# Patient Record
Sex: Male | Born: 1970
Health system: Southern US, Community
[De-identification: ages and names within clinical notes are randomized; demographics above are authoritative.]

## PROBLEM LIST (undated history)

## (undated) DIAGNOSIS — I10 Essential (primary) hypertension: Secondary | ICD-10-CM

## (undated) DIAGNOSIS — E119 Type 2 diabetes mellitus without complications: Secondary | ICD-10-CM

## (undated) DIAGNOSIS — I2510 Atherosclerotic heart disease of native coronary artery without angina pectoris: Secondary | ICD-10-CM

## (undated) HISTORY — DX: Atherosclerotic heart disease of native coronary artery without angina pectoris: I25.10

## (undated) HISTORY — PX: KNEE ARTHROSCOPY: SHX127

## (undated) HISTORY — PX: APPENDECTOMY: SHX54

---

## 2001-06-19 ENCOUNTER — Ambulatory Visit (HOSPITAL_COMMUNITY): Admission: RE | Admit: 2001-06-19 | Discharge: 2001-06-19 | Payer: Self-pay | Admitting: *Deleted

## 2001-06-19 ENCOUNTER — Encounter: Payer: Self-pay | Admitting: *Deleted

## 2015-08-03 ENCOUNTER — Encounter: Payer: Self-pay | Admitting: Family Medicine

## 2015-08-03 ENCOUNTER — Ambulatory Visit (INDEPENDENT_AMBULATORY_CARE_PROVIDER_SITE_OTHER): Payer: 59 | Admitting: Family Medicine

## 2015-08-03 VITALS — BP 148/92 | Temp 98.7°F | Ht 69.0 in | Wt 218.0 lb

## 2015-08-03 DIAGNOSIS — M67442 Ganglion, left hand: Secondary | ICD-10-CM

## 2015-08-03 NOTE — Progress Notes (Signed)
   Subjective:    Patient ID: [name], male    DOB: October 22, 1971, 44 y.o.   MRN: [id number]  HPIcyst on left middle finger. Came up 5 years ago. Saw Dr. Margo Aye in the past and had it removed but it came back.   Saw dr hall in past and cut it off, and then it coame back   Dr.  Margo Aye advised patient returns made to seek need to see a different type of specialist. Concerned it may be down towards the joint. Painful at times.   Review of Systems  no recent injury no headache no chest pain no back pain    Objective:   Physical Exam   alert vitals stable blood pressure good. Lungs clear heart rare rhythm. Nodular cyst potentially ganglion ounces time involved finger      Assessment & Plan:   impression cysts finger plan hand surgeon referral , pt encour to f u for chronic concerns and wellnessWSL

## 2015-08-17 ENCOUNTER — Telehealth: Payer: Self-pay | Admitting: Family Medicine

## 2015-08-17 NOTE — Telephone Encounter (Signed)
Sorry didn't do lets do

## 2015-08-17 NOTE — Telephone Encounter (Signed)
Pt is wanting to know if we were still going to do the referral to the hand surgeon for his cyst?  I do not see it in the system

## 2015-08-18 ENCOUNTER — Other Ambulatory Visit: Payer: Self-pay | Admitting: *Deleted

## 2015-08-18 DIAGNOSIS — M67449 Ganglion, unspecified hand: Secondary | ICD-10-CM

## 2015-08-18 NOTE — Telephone Encounter (Signed)
Order for referral put in.  

## 2015-08-18 NOTE — Telephone Encounter (Signed)
Pt notified on vm

## 2015-09-04 ENCOUNTER — Other Ambulatory Visit: Payer: Self-pay | Admitting: Orthopedic Surgery

## 2015-09-04 DIAGNOSIS — S60453A Superficial foreign body of left middle finger, initial encounter: Secondary | ICD-10-CM

## 2015-09-05 ENCOUNTER — Other Ambulatory Visit: Payer: Self-pay | Admitting: Orthopedic Surgery

## 2015-09-05 DIAGNOSIS — S60453A Superficial foreign body of left middle finger, initial encounter: Secondary | ICD-10-CM

## 2015-09-05 DIAGNOSIS — R52 Pain, unspecified: Secondary | ICD-10-CM

## 2015-09-05 DIAGNOSIS — R609 Edema, unspecified: Secondary | ICD-10-CM

## 2015-09-19 ENCOUNTER — Ambulatory Visit
Admission: RE | Admit: 2015-09-19 | Discharge: 2015-09-19 | Disposition: A | Discharge status: Home / Self Care | Payer: 59 | Source: Ambulatory Visit | Attending: Orthopedic Surgery | Admitting: Orthopedic Surgery

## 2015-09-19 DIAGNOSIS — R609 Edema, unspecified: Secondary | ICD-10-CM

## 2015-09-19 DIAGNOSIS — S60453A Superficial foreign body of left middle finger, initial encounter: Secondary | ICD-10-CM

## 2015-09-19 DIAGNOSIS — R52 Pain, unspecified: Secondary | ICD-10-CM

## 2015-09-27 ENCOUNTER — Other Ambulatory Visit: Payer: Self-pay | Admitting: Orthopedic Surgery

## 2015-10-05 ENCOUNTER — Encounter (HOSPITAL_BASED_OUTPATIENT_CLINIC_OR_DEPARTMENT_OTHER): Payer: Self-pay | Admitting: *Deleted

## 2015-10-12 ENCOUNTER — Ambulatory Visit (HOSPITAL_BASED_OUTPATIENT_CLINIC_OR_DEPARTMENT_OTHER)
Admission: RE | Admit: 2015-10-12 | Discharge: 2015-10-12 | Disposition: A | Discharge status: Home / Self Care | Payer: 59 | Source: Ambulatory Visit | Attending: Orthopedic Surgery | Admitting: Orthopedic Surgery

## 2015-10-12 ENCOUNTER — Encounter (HOSPITAL_BASED_OUTPATIENT_CLINIC_OR_DEPARTMENT_OTHER)
Admission: RE | Disposition: A | Discharge status: Home / Self Care | Payer: Self-pay | Source: Ambulatory Visit | Attending: Orthopedic Surgery

## 2015-10-12 ENCOUNTER — Encounter (HOSPITAL_BASED_OUTPATIENT_CLINIC_OR_DEPARTMENT_OTHER): Payer: Self-pay | Admitting: Certified Registered"

## 2015-10-12 ENCOUNTER — Ambulatory Visit (HOSPITAL_BASED_OUTPATIENT_CLINIC_OR_DEPARTMENT_OTHER): Payer: 59 | Admitting: Certified Registered"

## 2015-10-12 DIAGNOSIS — E119 Type 2 diabetes mellitus without complications: Secondary | ICD-10-CM | POA: Diagnosis not present

## 2015-10-12 DIAGNOSIS — R2232 Localized swelling, mass and lump, left upper limb: Secondary | ICD-10-CM | POA: Diagnosis present

## 2015-10-12 DIAGNOSIS — F17210 Nicotine dependence, cigarettes, uncomplicated: Secondary | ICD-10-CM | POA: Insufficient documentation

## 2015-10-12 DIAGNOSIS — M19042 Primary osteoarthritis, left hand: Secondary | ICD-10-CM | POA: Insufficient documentation

## 2015-10-12 DIAGNOSIS — I10 Essential (primary) hypertension: Secondary | ICD-10-CM | POA: Insufficient documentation

## 2015-10-12 HISTORY — PX: GANGLION CYST EXCISION: SHX1691

## 2015-10-12 HISTORY — DX: Type 2 diabetes mellitus without complications: E11.9

## 2015-10-12 HISTORY — DX: Essential (primary) hypertension: I10

## 2015-10-12 SURGERY — EXCISION, GANGLION CYST, WRIST
Anesthesia: Monitor Anesthesia Care | Site: Finger | Laterality: Left

## 2015-10-12 MED ORDER — ONDANSETRON HCL 4 MG/2ML IJ SOLN
INTRAMUSCULAR | Status: AC
  Filled 2015-10-12: qty 2

## 2015-10-12 MED ORDER — GLYCOPYRROLATE 0.2 MG/ML IJ SOLN: 0.2000 mg | Freq: Once | INTRAMUSCULAR | Status: DC | PRN

## 2015-10-12 MED ORDER — MIDAZOLAM HCL 2 MG/2ML IJ SOLN
INTRAMUSCULAR | Status: AC
  Filled 2015-10-12: qty 4

## 2015-10-12 MED ORDER — CHLORHEXIDINE GLUCONATE 4 % EX LIQD: 60.0000 mL | Freq: Once | CUTANEOUS | Status: DC

## 2015-10-12 MED ORDER — PROPOFOL 10 MG/ML IV BOLUS
INTRAVENOUS | Status: DC | PRN
  Administered 2015-10-12: 30 mg via INTRAVENOUS
  Administered 2015-10-12 (×2): 20 mg via INTRAVENOUS

## 2015-10-12 MED ORDER — LACTATED RINGERS IV SOLN
INTRAVENOUS | Status: DC
  Administered 2015-10-12: 12:00:00 via INTRAVENOUS

## 2015-10-12 MED ORDER — HYDROMORPHONE HCL 1 MG/ML IJ SOLN: 0.2500 mg | INTRAMUSCULAR | Status: DC | PRN

## 2015-10-12 MED ORDER — CEFAZOLIN SODIUM-DEXTROSE 2-3 GM-% IV SOLR
INTRAVENOUS | Status: AC
  Filled 2015-10-12: qty 50

## 2015-10-12 MED ORDER — MEPERIDINE HCL 25 MG/ML IJ SOLN: 6.2500 mg | INTRAMUSCULAR | Status: DC | PRN

## 2015-10-12 MED ORDER — HYDROCODONE-ACETAMINOPHEN 5-325 MG PO TABS: 1.0000 | ORAL_TABLET | Freq: Four times a day (QID) | ORAL | Status: DC | PRN

## 2015-10-12 MED ORDER — ONDANSETRON HCL 4 MG/2ML IJ SOLN
4.0000 mg | Freq: Once | INTRAMUSCULAR | Status: DC | PRN
Start: 2015-10-12 — End: 2015-10-12

## 2015-10-12 MED ORDER — FENTANYL CITRATE (PF) 100 MCG/2ML IJ SOLN
50.0000 ug | INTRAMUSCULAR | Status: DC | PRN
  Administered 2015-10-12 (×2): 50 ug via INTRAVENOUS

## 2015-10-12 MED ORDER — LIDOCAINE HCL (PF) 0.5 % IJ SOLN
INTRAMUSCULAR | Status: DC | PRN
  Administered 2015-10-12: 35 mL via INTRAVENOUS

## 2015-10-12 MED ORDER — CEFAZOLIN SODIUM-DEXTROSE 2-3 GM-% IV SOLR: 2.0000 g | INTRAVENOUS | Status: DC

## 2015-10-12 MED ORDER — SCOPOLAMINE 1 MG/3DAYS TD PT72: 1.0000 | MEDICATED_PATCH | Freq: Once | TRANSDERMAL | Status: DC | PRN

## 2015-10-12 MED ORDER — LIDOCAINE HCL (CARDIAC) 20 MG/ML IV SOLN
INTRAVENOUS | Status: AC
  Filled 2015-10-12: qty 5

## 2015-10-12 MED ORDER — BUPIVACAINE HCL (PF) 0.25 % IJ SOLN
INTRAMUSCULAR | Status: DC | PRN
  Administered 2015-10-12: 7 mL

## 2015-10-12 MED ORDER — FENTANYL CITRATE (PF) 100 MCG/2ML IJ SOLN
INTRAMUSCULAR | Status: AC
  Filled 2015-10-12: qty 4

## 2015-10-12 MED ORDER — CHLORHEXIDINE GLUCONATE 4 % EX LIQD
60.0000 mL | Freq: Once | CUTANEOUS | Status: DC
Start: 2015-10-12 — End: 2015-10-12

## 2015-10-12 MED ORDER — ONDANSETRON HCL 4 MG/2ML IJ SOLN
INTRAMUSCULAR | Status: DC | PRN
  Administered 2015-10-12: 4 mg via INTRAVENOUS

## 2015-10-12 MED ORDER — CEFAZOLIN SODIUM-DEXTROSE 2-3 GM-% IV SOLR
2.0000 g | INTRAVENOUS | Status: AC
  Administered 2015-10-12: 2 g via INTRAVENOUS

## 2015-10-12 MED ORDER — MIDAZOLAM HCL 2 MG/2ML IJ SOLN
1.0000 mg | INTRAMUSCULAR | Status: DC | PRN
  Administered 2015-10-12 (×2): 1 mg via INTRAVENOUS

## 2015-10-12 SURGICAL SUPPLY — 49 items
BAG DECANTER FOR FLEXI CONT (MISCELLANEOUS) IMPLANT
BLADE MINI RND TIP GREEN BEAV (BLADE) IMPLANT
BLADE SURG 15 STRL LF DISP TIS (BLADE) ×1 IMPLANT
BLADE SURG 15 STRL SS (BLADE) ×1
BNDG COHESIVE 1X5 TAN STRL LF (GAUZE/BANDAGES/DRESSINGS) ×2 IMPLANT
BNDG COHESIVE 3X5 TAN STRL LF (GAUZE/BANDAGES/DRESSINGS) ×2 IMPLANT
BNDG ESMARK 4X9 LF (GAUZE/BANDAGES/DRESSINGS) ×2 IMPLANT
BNDG GAUZE ELAST 4 BULKY (GAUZE/BANDAGES/DRESSINGS) IMPLANT
CHLORAPREP W/TINT 26ML (MISCELLANEOUS) IMPLANT
CORDS BIPOLAR (ELECTRODE) ×2 IMPLANT
COVER BACK TABLE 60X90IN (DRAPES) ×2 IMPLANT
COVER MAYO STAND STRL (DRAPES) ×2 IMPLANT
CUFF TOURNIQUET SINGLE 18IN (TOURNIQUET CUFF) ×2 IMPLANT
DECANTER SPIKE VIAL GLASS SM (MISCELLANEOUS) ×2 IMPLANT
DRAPE EXTREMITY T 121X128X90 (DRAPE) ×2 IMPLANT
DRAPE SURG 17X23 STRL (DRAPES) ×2 IMPLANT
GAUZE PACKING IODOFORM 1/4X15 (GAUZE/BANDAGES/DRESSINGS) IMPLANT
GAUZE SPONGE 4X4 12PLY STRL (GAUZE/BANDAGES/DRESSINGS) IMPLANT
GAUZE XEROFORM 1X8 LF (GAUZE/BANDAGES/DRESSINGS) ×2 IMPLANT
GLOVE BIOGEL PI IND STRL 8 (GLOVE) ×1 IMPLANT
GLOVE BIOGEL PI IND STRL 8.5 (GLOVE) ×1 IMPLANT
GLOVE BIOGEL PI INDICATOR 8 (GLOVE) ×1
GLOVE BIOGEL PI INDICATOR 8.5 (GLOVE) ×1
GLOVE SURG ORTHO 8.0 STRL STRW (GLOVE) IMPLANT
GOWN STRL REUS W/ TWL LRG LVL3 (GOWN DISPOSABLE) ×1 IMPLANT
GOWN STRL REUS W/TWL LRG LVL3 (GOWN DISPOSABLE) ×1
GOWN STRL REUS W/TWL XL LVL3 (GOWN DISPOSABLE) ×4 IMPLANT
LOOP VESSEL MAXI BLUE (MISCELLANEOUS) IMPLANT
NEEDLE PRECISIONGLIDE 27X1.5 (NEEDLE) ×2 IMPLANT
NS IRRIG 1000ML POUR BTL (IV SOLUTION) ×2 IMPLANT
PACK BASIN DAY SURGERY FS (CUSTOM PROCEDURE TRAY) ×2 IMPLANT
PAD CAST 3X4 CTTN HI CHSV (CAST SUPPLIES) IMPLANT
PADDING CAST ABS 4INX4YD NS (CAST SUPPLIES)
PADDING CAST ABS COTTON 4X4 ST (CAST SUPPLIES) IMPLANT
PADDING CAST COTTON 3X4 STRL (CAST SUPPLIES)
SPLINT FINGER 3.25 BULB 911905 (SOFTGOODS) ×2 IMPLANT
SPLINT PLASTER CAST XFAST 3X15 (CAST SUPPLIES) IMPLANT
SPLINT PLASTER XTRA FASTSET 3X (CAST SUPPLIES)
STOCKINETTE 4X48 STRL (DRAPES) ×2 IMPLANT
SUT ETHILON 4 0 PS 2 18 (SUTURE) ×2 IMPLANT
SUT VIC AB 4-0 P2 18 (SUTURE) IMPLANT
SUT VICRYL 4-0 PS2 18IN ABS (SUTURE) IMPLANT
SWAB COLLECTION DEVICE MRSA (MISCELLANEOUS) IMPLANT
SWAB CULTURE ESWAB REG 1ML (MISCELLANEOUS) IMPLANT
SYR BULB 3OZ (MISCELLANEOUS) ×2 IMPLANT
SYR CONTROL 10ML LL (SYRINGE) ×2 IMPLANT
TOWEL OR 17X24 6PK STRL BLUE (TOWEL DISPOSABLE) ×2 IMPLANT
TUBE FEEDING 5FR 15 INCH (TUBING) IMPLANT
UNDERPAD 30X30 (UNDERPADS AND DIAPERS) ×2 IMPLANT

## 2015-10-12 NOTE — Anesthesia Preprocedure Evaluation (Signed)
Anesthesia Evaluation  Patient identified by MRN, date of birth, ID band Patient awake    Reviewed: Allergy & Precautions, NPO status , Patient's Chart, lab work & pertinent test results  Airway Mallampati: I  TM Distance: >3 FB Neck ROM: Full    Dental   Pulmonary Current Smoker,    Pulmonary exam normal        Cardiovascular hypertension, Pt. on medications Normal cardiovascular exam     Neuro/Psych    GI/Hepatic   Endo/Other  diabetes, Type 2, Oral Hypoglycemic Agents  Renal/GU      Musculoskeletal   Abdominal   Peds  Hematology   Anesthesia Other Findings   Reproductive/Obstetrics                             Anesthesia Physical Anesthesia Plan  ASA: II  Anesthesia Plan: Bier Block   Post-op Pain Management:    Induction: Intravenous  Airway Management Planned: Simple Face Mask  Additional Equipment:   Intra-op Plan:   Post-operative Plan:   Informed Consent: I have reviewed the patients History and Physical, chart, labs and discussed the procedure including the risks, benefits and alternatives for the proposed anesthesia with the patient or authorized representative who has indicated his/her understanding and acceptance.     Plan Discussed with: CRNA and Surgeon  Anesthesia Plan Comments:         Anesthesia Quick Evaluation

## 2015-10-12 NOTE — Discharge Instructions (Addendum)

## 2015-10-12 NOTE — Anesthesia Postprocedure Evaluation (Signed)
Anesthesia Post Note  Patient: [name]  Procedure(s) Performed: Procedure(s) (LRB): EXCISION MUCOID TUMOR LEFT MIDDLE FINGER  (Left)  Anesthesia type: general  Patient location: PACU  Post pain: Pain level controlled  Post assessment: Patient's Cardiovascular Status Stable  Last Vitals:  Filed Vitals:   10/12/15 1415  BP: 134/90  Pulse: 66  Temp: 36.4 C  Resp: 20    Post vital signs: Reviewed and stable  Level of consciousness: sedated  Complications: No apparent anesthesia complications

## 2015-10-12 NOTE — Transfer of Care (Signed)
Immediate Anesthesia Transfer of Care Note  Patient: [name]  Procedure(s) Performed: Procedure(s) with comments: EXCISION MUCOID TUMOR LEFT MIDDLE FINGER  (Left) - Left middle finger.  Patient Location: PACU  Anesthesia Type:MAC and Bier block  Level of Consciousness: awake, alert , oriented and patient cooperative  Airway & Oxygen Therapy: Patient Spontanous Breathing and Patient connected to face mask oxygen  Post-op Assessment: Report given to RN, Post -op Vital signs reviewed and stable and Patient moving all extremities  Post vital signs: Reviewed and stable  Last Vitals:  Filed Vitals:   10/12/15 1121  BP: 156/93  Pulse: 71  Temp: 36.8 C  Resp: 20    Complications: No apparent anesthesia complications

## 2015-10-12 NOTE — Op Note (Signed)
Dictation Number [id number]

## 2015-10-12 NOTE — Brief Op Note (Signed)
10/12/2015  12:54 PM  PATIENT:  Octaviano GlowPhilip W Gauthier  44 y.o. 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  SURGEON:  Surgeon(s) and Role:    * Cindee SaltGary Dacoda Spallone, MD - Primary  PHYSICIAN ASSISTANT:   ASSISTANTS: none   ANESTHESIA:   local and regional  EBL:  Total I/O In: 600 [I.V.:600] Out: -   BLOOD ADMINISTERED:none  DRAINS: none   LOCAL MEDICATIONS USED:  BUPIVICAINE   SPECIMEN:  Excision  DISPOSITION OF SPECIMEN:  PATHOLOGY  COUNTS:  YES  TOURNIQUET:   Total Tourniquet Time Documented: Forearm (Left) - 31 minutes Total: Forearm (Left) - 31 minutes   DICTATION: .Other Dictation: Dictation Number [id number]  PLAN OF CARE: Discharge to home after PACU  PATIENT DISPOSITION:  PACU - hemodynamically stable.

## 2015-10-12 NOTE — H&P (Signed)
[name] is a 44 year-old right-hand dominant male who is referred by Dr. Gerda Diss for consultation with respect to a swelling area over the distal interphalangeal joint of his left middle finger. This has been present since 2010. He feels he stuck a bamboo briar in it at that time.  It has intermittently opened up, draining a clear, thick sticky fluid.  The last time being a couple of weeks ago.  He states that it is more of an annoyance causing mild discomfort for him, it is a stabbing type pain.  He feels it is gradually getting worse.  He has history of borderline diabetes, no history of thyroid problems, arthritis or gout.  He has had his MRI done. This reveals that he indeed has a cyst present. No foreign material was seen.   ALLERGIES:   None.  MEDICATIONS:      None.  SURGICAL HISTORY:     Appendectomy and knee surgery.  FAMILY MEDICAL HISTORY:    Negative.  SOCIAL HISTORY:      He smokes two packs a day and is advised to quit with the reasons behind this.  He does not drink.  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    REVIEW OF SYSTEMS:    Positive for glasses, ringing in his ears, otherwise negative 14 points.  [name] is an 44 y.o. male.   Chief Complaint: Mucoid tumor left middle finger HPI: see above  Past Medical History  Diagnosis Date  . Diabetes mellitus without complication (HCC)     diet controlled  . Hypertension     h/o of HTN, on no meds now.    Past Surgical History  Procedure Laterality Date  . Appendectomy    . Knee arthroscopy Left     History reviewed. No pertinent family history. Social History:  reports that he has been smoking.  He does not have any smokeless tobacco history on file. His alcohol and drug histories are not on file.  Allergies: No Known Allergies  Medications Prior to Admission  Medication Sig Dispense Refill  . glucosamine-chondroitin 500-400 MG tablet Take 1 tablet by mouth 3 (three) times daily.      No results  found for this or any previous visit (from the past 48 hour(s)).  No results found.   Pertinent items are noted in HPI.  Height  (1.753 m), weight 98.884 kg (218 lb).  General appearance: alert, cooperative and appears stated age Head: Normocephalic, without obvious abnormality Neck: no JVD Resp: clear to auscultation bilaterally Cardio: regular rate and rhythm, S1, S2 normal, no murmur, click, rub or gallop GI: soft, non-tender; bowel sounds normal; no masses,  no organomegaly Extremities: mass lrft middle finger dip joint Pulses: 2+ and symmetric Skin: Skin color, texture, turgor normal. No rashes or lesions Neurologic: Grossly normal Incision/Wound: na  Assessment/Plan RADIOGRAPHS:       X-rays reveal minimal changes to the distal interphalangeal joint, circulation is intact.  there is no evidence of infection.   x-rays reveal slight whitening of the sclerosis of the phalanx articular surface, otherwise negative.    DIAGNOSIS:      mucoid tumor with minimal degenerative changes.  The only question is whether indeed a briar was stuck in this and a portion was retained.     He would like to have this excised. Pre, peri and post op care are discussed along with risks and complications. Patient is aware there is no guarantee with  surgery, possibility of infection, injury to arteries, nerves, and tendons, incomplete relief and dystrophy. He is scheduled for excision of mucoid tumor, debridement DIP joint left middle finger as an outpatient under regional anesthesia.    [name] 10/12/2015, 11:10 AM

## 2015-10-13 ENCOUNTER — Encounter (HOSPITAL_BASED_OUTPATIENT_CLINIC_OR_DEPARTMENT_OTHER): Payer: Self-pay | Admitting: Orthopedic Surgery

## 2015-10-13 NOTE — Op Note (Signed)
[name], [name]               ACCOUNT NO.:  [id number]  MEDICAL RECORD NO.:  [id number]  LOCATION:                                 FACILITY:  PHYSICIAN:  Cindee SaltGary Demondre Aguas, M.D.            DATE OF BIRTH:  DATE OF PROCEDURE:  10/12/2015 DATE OF DISCHARGE:                              OPERATIVE REPORT   PREOPERATIVE DIAGNOSIS:  Mass, left middle finger with degenerative arthritis, distal interphalangeal joint.  POSTOPERATIVE DIAGNOSIS:  Mass, left middle finger with degenerative arthritis, distal interphalangeal joint.  OPERATION:  Excision of mass, debridement of distal interphalangeal joint, left middle finger.  SURGEON:  [name], M.D.  ANESTHESIA:  Forearm-based IV regional with metacarpal block.  ANESTHESIOLOGIST:  [name], M.D.  HISTORY:  The patient is a 44 year old male with a history of a mass over the DIP joint of his left middle finger.  Feels though he has gotten a foreign body into this.  MRI does not reveal a foreign body, but this is present indicative of a mucoid tumor.  He is desirous of having this excised.  Pre, peri, and postoperative course have been discussed along with risks and complications.  He is aware that there is no guarantee with the surgery, possibility of infection; recurrence of injury to arteries, nerves, tendons; incomplete relief of symptoms; and dystrophy.  In the preoperative area, the patient is seen, the extremity marked by both the patient and surgeon.  Antibiotic given.  PROCEDURE IN DETAIL:  The patient was brought to the operating room, where a forearm-based IV regional anesthetic was carried out without difficulty.  He was prepped using Betadine scrub and solution with the left arm free.  A 3-minute dry time was allowed.  Time-out taken, confirming the patient and procedure.  A metacarpal block was given with 0.25% bupivacaine without epinephrine.  A curvilinear incision was made over the mass, carried down through the  subcutaneous tissue.  Solid area was found with an area of granulation tissue in the center, this was excised in total and sent to Pathology.  The joint was opened, debrided with a small hemostatic rongeur, performing a synovectomy dorsally.  The wound was copiously irrigated with saline.  The skin then closed with interrupted 5-0 nylon sutures.  A metacarpal block given including bupivacaine with a total of 8 mL.  The patient tolerated the procedure well.  A sterile compressive dressing splint to the distal interphalangeal joint was applied prior to be discharged.  He was taken to the recovery room for observation in a satisfactory condition.  [facility] be [facility] home [facility] [place].          ______________________________ [name], M.D.     GK/MEDQ  D:  10/12/2015  T:  10/13/2015  Job:  [id number]

## 2015-10-13 NOTE — Addendum Note (Signed)
Addendum  created 10/13/15 [id number] by Barbarann Kelly W Sheniqua Carolan, CRNA   Modules edited: Charges VN

## 2015-10-18 ENCOUNTER — Encounter (HOSPITAL_COMMUNITY): Payer: Self-pay | Admitting: Emergency Medicine

## 2015-10-18 ENCOUNTER — Emergency Department (HOSPITAL_COMMUNITY)
Admission: EM | Admit: 2015-10-18 | Discharge: 2015-10-18 | Disposition: A | Discharge status: Home / Self Care | Payer: 59 | Attending: Emergency Medicine | Admitting: Emergency Medicine

## 2015-10-18 DIAGNOSIS — T1502XA Foreign body in cornea, left eye, initial encounter: Secondary | ICD-10-CM

## 2015-10-18 DIAGNOSIS — Z23 Encounter for immunization: Secondary | ICD-10-CM | POA: Insufficient documentation

## 2015-10-18 DIAGNOSIS — H18892 Other specified disorders of cornea, left eye: Secondary | ICD-10-CM

## 2015-10-18 DIAGNOSIS — F17200 Nicotine dependence, unspecified, uncomplicated: Secondary | ICD-10-CM | POA: Diagnosis not present

## 2015-10-18 DIAGNOSIS — Y9289 Other specified places as the place of occurrence of the external cause: Secondary | ICD-10-CM | POA: Diagnosis not present

## 2015-10-18 DIAGNOSIS — Y998 Other external cause status: Secondary | ICD-10-CM | POA: Insufficient documentation

## 2015-10-18 DIAGNOSIS — Z79899 Other long term (current) drug therapy: Secondary | ICD-10-CM | POA: Insufficient documentation

## 2015-10-18 DIAGNOSIS — E119 Type 2 diabetes mellitus without complications: Secondary | ICD-10-CM | POA: Diagnosis not present

## 2015-10-18 DIAGNOSIS — M151 Heberden's nodes (with arthropathy): Secondary | ICD-10-CM | POA: Insufficient documentation

## 2015-10-18 DIAGNOSIS — I10 Essential (primary) hypertension: Secondary | ICD-10-CM | POA: Insufficient documentation

## 2015-10-18 DIAGNOSIS — Y9389 Activity, other specified: Secondary | ICD-10-CM | POA: Insufficient documentation

## 2015-10-18 DIAGNOSIS — D2112 Benign neoplasm of connective and other soft tissue of left upper limb, including shoulder: Secondary | ICD-10-CM | POA: Insufficient documentation

## 2015-10-18 DIAGNOSIS — X58XXXA Exposure to other specified factors, initial encounter: Secondary | ICD-10-CM | POA: Diagnosis not present

## 2015-10-18 DIAGNOSIS — S0592XA Unspecified injury of left eye and orbit, initial encounter: Secondary | ICD-10-CM | POA: Diagnosis present

## 2015-10-18 MED ORDER — TETANUS-DIPHTH-ACELL PERTUSSIS 5-2.5-18.5 LF-MCG/0.5 IM SUSP
0.5000 mL | Freq: Once | INTRAMUSCULAR | Status: AC
  Administered 2015-10-18: 0.5 mL via INTRAMUSCULAR
  Filled 2015-10-18: qty 0.5

## 2015-10-18 MED ORDER — FLUORESCEIN SODIUM 1 MG OP STRP
ORAL_STRIP | OPHTHALMIC | Status: AC
  Filled 2015-10-18: qty 1

## 2015-10-18 MED ORDER — ERYTHROMYCIN 5 MG/GM OP OINT
TOPICAL_OINTMENT | Freq: Once | OPHTHALMIC | Status: AC
  Administered 2015-10-18: 13:00:00 via OPHTHALMIC
  Filled 2015-10-18: qty 3.5

## 2015-10-18 MED ORDER — TETRACAINE HCL 0.5 % OP SOLN
OPHTHALMIC | Status: AC
  Filled 2015-10-18: qty 2

## 2015-10-18 NOTE — ED Notes (Signed)
Injury to left eye on Monday working on steel.  Redness noted, rates pain 9/10.

## 2015-10-18 NOTE — ED Notes (Signed)
PA Julie Idol at bedside. 

## 2015-10-18 NOTE — Discharge Instructions (Signed)
Eye Foreign Body A foreign body refers to any object on the surface of the eye or in the eyeball that should not be there. A foreign body may be a small speck of dirt or dust, a hair or eyelash, a splinter, or any other object.  SIGNS AND SYMPTOMS Symptoms depend on what the foreign body is and where it is in the eye. The most common locations are:   On the inner surface of the upper or lower eyelids or on the covering of the white part of the eye (conjunctiva). Symptoms in this location are:  Pain and irritation, especially when blinking.  The feeling that something is in the eye.  On the surface of the clear covering on the front of the eye (cornea). Symptoms in this location include:  Pain and irritation.   Small "rust rings" around a metallic foreign body.  The feeling that something is in the eye.   Inside the eyeball. Foreign bodies inside the eye may cause:   Great pain.   Immediate loss of vision.   Distortion of the pupil. DIAGNOSIS  Foreign bodies are found during an exam by an eye specialist. Those on the eyelids, conjunctiva, or cornea are usually (but not always) easily found. When a foreign body is inside the eyeball, a cloudiness of the lens (cataract) may form almost right away. This makes it hard for an eye specialist to find the foreign body. Tests may be needed, including ultrasound testing, X-rays, and CT scans. TREATMENT   Foreign bodies on the eyelids, conjunctiva, or cornea are often removed easily and painlessly.  Rust in the cornea may require the use of a drill-like instrument to remove the rust.  If the foreign body has caused a scratch or a rubbing or scraping (abrasion) of the cornea, this may be treated with antibiotic drops or ointment. A pressure patch may be put over your eye.  If the foreign body is inside your eyeball, surgery is needed right away. This is a medical emergency. Foreign bodies inside the eye threaten vision. A person may even  lose his or her eye. HOME CARE INSTRUCTIONS   Take medicines only as directed by your health care provider. Use eye drops or ointment as directed.  If no eye patch was applied:  Keep your eye closed as much as possible.  Do not rub your eye.  Wear dark glasses as needed to protect your eyes from bright light.  Do not wear contact lenses until your eye feels normal again, or as instructed by your health care provider.  Wear a protective eye covering if there is a risk of eye injury. This is important when working with high-speed tools.  If your eye is patched:  Follow your health care provider's instructions for when to remove the patch.  Do notdrive or operate machinery if your eye is patched. Your ability to judge distances is impaired.  Keep all follow-up visits as directed by your health care provider. This is important. SEEK MEDICAL CARE IF:   You have increased pain in your eye.  Your vision gets worse.   You have problems with your eye patch.   You have fluid (discharge) coming from your injured eye.   You have redness and swelling around your affected eye.  MAKE SURE YOU:   Understand these instructions.  Will watch your condition.  Will get help right away if you are not doing well or get worse.   This information is not intended to  replace advice given to you by your health care provider. Make sure you discuss any questions you have with your health care provider.   Document Released: 11/18/2005 Document Revised: 12/09/2014 Document Reviewed: 04/15/2013 Elsevier Interactive Patient Education 2016 [employer].   Apply the antibiotic ointment to your left eye every 6 hours (small ribbon)

## 2015-10-20 NOTE — ED Provider Notes (Signed)
CSN: [id number]     Arrival date & time 10/18/15  1053 History   First MD Initiated Contact with Patient 10/18/15 1123     Chief Complaint  Patient presents with  . Eye Injury    left     (Consider location/radiation/quality/duration/timing/severity/associated sxs/prior Treatment) The history is provided by the patient.   [name] is a 44 y.o. male presenting with a 2 day history of left eye pain from foreign body.  He was grinding steel (wearing eyeglasses but not protective glasses) when a piece flew in the left eye.  He has had pain since the event along with photophobia.  He denies any reduction in visual acuity except as affected by copious clear tearing of the eye. He has been using otc saline eye drops without relief of symptoms. He is unsure of his tetanus status.    Past Medical History  Diagnosis Date  . Diabetes mellitus without complication (HCC)     diet controlled  . Hypertension     h/o of HTN, on no meds now.   Past Surgical History  Procedure Laterality Date  . Appendectomy    . Knee arthroscopy Left   . Ganglion cyst excision Left 10/12/2015    Procedure: EXCISION MUCOID TUMOR LEFT MIDDLE FINGER ;  Surgeon: [name], MD;  Location: [facility];  Service: Orthopedics;  Laterality: Left;  Left middle finger.   History reviewed. No pertinent family history. Social History  Substance Use Topics  . Smoking status: Current Every Day Smoker  . Smokeless tobacco: None  . Alcohol Use: None    Review of Systems  Constitutional: Negative.   HENT: Negative.   Eyes: Positive for photophobia and pain.  Respiratory: Negative.   Skin: Negative for wound.  Neurological: Negative for headaches.  Psychiatric/Behavioral: Negative.       Allergies  Review of patient's allergies indicates no known allergies.  Home Medications   Prior to Admission medications   Medication Sig Start Date End Date Taking? Authorizing Provider   glucosamine-chondroitin 500-400 MG tablet Take 1 tablet by mouth 3 (three) times daily.   Yes Historical Provider, MD  HYDROcodone-acetaminophen (NORCO) 5-325 MG tablet Take 1 tablet by mouth 3 (three) times daily as needed. 10/12/15  Yes Historical Provider, MD   BP 145/78 mmHg  Pulse 85  Temp(Src) 97.9 F (36.6 C) (Oral)  Resp 16  Ht 5\' 9"  (1.753 m)  Wt 215 lb (97.523 kg)  BMI 31.74 kg/m2  SpO2 100% Physical Exam  Constitutional: He appears well-developed and well-nourished.  HENT:  Head: Normocephalic and atraumatic.  Eyes: EOM are normal. Pupils are equal, round, and reactive to light. Lids are everted and swept, no foreign bodies found. Left conjunctiva is injected.  Slit lamp exam:      The left eye shows corneal ulcer, foreign body and fluorescein uptake.  Metal foreign body with surrounding rust ring left cornea 7 oclock position.  Neck: Normal range of motion.  Cardiovascular: Normal rate.   Pulmonary/Chest: Effort normal.  Abdominal: Bowel sounds are normal.  Neurological: He is alert.  Skin: Skin is warm and dry.  Psychiatric: He has a normal mood and affect.  Nursing note and vitals reviewed.   ED Course  .Foreign Body Removal Date/Time: 10/18/2015 12:30 PM Performed by: Burgess AmorIDOL, Whitni Pasquini Authorized by: Burgess AmorIDOL, Martine Bleecker Consent: Verbal consent obtained. Risks and benefits: risks, benefits and alternatives were discussed Consent given by: patient Patient identity confirmed: verbally with patient Body area: eye Location details:  left cornea Local anesthetic: tetracaine drops Anesthetic total: 2 drops Patient sedated: no Removal mechanism: moist cotton swab Eye examined with fluorescein. Fluorescein uptake. Corneal abrasion size: small Corneal abrasion location: medial and inferior Residual rust ring present. Depth: embedded Complexity: simple 1 objects recovered. Objects recovered: metal flake Post-procedure assessment: foreign body removed Comments: Metal  removed, residual rust ring within small ulceration site   (including critical care time) Labs Review Labs Reviewed - No data to display  Imaging Review No results found. I have personally reviewed and evaluated these images and lab results as part of my medical decision-making.   EKG Interpretation None      MDM   Final diagnoses:  Corneal rust ring of left eye  Corneal foreign body, left, initial encounter    Pt placed on erythromycin ointment to protect the eye and for comfort, tetanus updated.  Discussed with Dr. Lita Mains - will see pt in office f/u.  Advised pt to call for appt time for removal of rust ring this week.  Pt understands and agrees with plan.  The patient appears reasonably screened and/or stabilized for discharge and I doubt any other medical condition or other [facility] requiring further screening, evaluation, or treatment in the ED at this time prior to discharge.     Burgess Amor, PA-C 10/20/15 6213  Azalia Bilis, MD 10/20/15 [phone]

## 2016-05-08 ENCOUNTER — Emergency Department (HOSPITAL_COMMUNITY): Payer: BLUE CROSS/BLUE SHIELD

## 2016-05-08 ENCOUNTER — Encounter (HOSPITAL_COMMUNITY): Payer: Self-pay | Admitting: Emergency Medicine

## 2016-05-08 ENCOUNTER — Observation Stay (HOSPITAL_BASED_OUTPATIENT_CLINIC_OR_DEPARTMENT_OTHER)
Admission: EM | Admit: 2016-05-08 | Discharge: 2016-05-09 | Disposition: A | Discharge status: Home / Self Care | Payer: BLUE CROSS/BLUE SHIELD | Source: Home / Self Care | Attending: Emergency Medicine | Admitting: Emergency Medicine

## 2016-05-08 DIAGNOSIS — F17200 Nicotine dependence, unspecified, uncomplicated: Secondary | ICD-10-CM | POA: Insufficient documentation

## 2016-05-08 DIAGNOSIS — I10 Essential (primary) hypertension: Secondary | ICD-10-CM

## 2016-05-08 DIAGNOSIS — E119 Type 2 diabetes mellitus without complications: Secondary | ICD-10-CM

## 2016-05-08 DIAGNOSIS — I119 Hypertensive heart disease without heart failure: Secondary | ICD-10-CM | POA: Diagnosis not present

## 2016-05-08 DIAGNOSIS — E1165 Type 2 diabetes mellitus with hyperglycemia: Secondary | ICD-10-CM | POA: Diagnosis not present

## 2016-05-08 DIAGNOSIS — F419 Anxiety disorder, unspecified: Secondary | ICD-10-CM | POA: Diagnosis not present

## 2016-05-08 DIAGNOSIS — R0789 Other chest pain: Secondary | ICD-10-CM | POA: Diagnosis not present

## 2016-05-08 DIAGNOSIS — I214 Non-ST elevation (NSTEMI) myocardial infarction: Secondary | ICD-10-CM

## 2016-05-08 DIAGNOSIS — J9811 Atelectasis: Secondary | ICD-10-CM | POA: Diagnosis not present

## 2016-05-08 DIAGNOSIS — Z8249 Family history of ischemic heart disease and other diseases of the circulatory system: Secondary | ICD-10-CM | POA: Diagnosis not present

## 2016-05-08 DIAGNOSIS — R079 Chest pain, unspecified: Secondary | ICD-10-CM | POA: Diagnosis present

## 2016-05-08 DIAGNOSIS — E785 Hyperlipidemia, unspecified: Secondary | ICD-10-CM | POA: Diagnosis not present

## 2016-05-08 DIAGNOSIS — E118 Type 2 diabetes mellitus with unspecified complications: Secondary | ICD-10-CM | POA: Diagnosis not present

## 2016-05-08 DIAGNOSIS — I080 Rheumatic disorders of both mitral and aortic valves: Secondary | ICD-10-CM | POA: Diagnosis not present

## 2016-05-08 DIAGNOSIS — E1100 Type 2 diabetes mellitus with hyperosmolarity without nonketotic hyperglycemic-hyperosmolar coma (NKHHC): Secondary | ICD-10-CM | POA: Diagnosis not present

## 2016-05-08 DIAGNOSIS — E8881 Metabolic syndrome: Secondary | ICD-10-CM | POA: Diagnosis not present

## 2016-05-08 DIAGNOSIS — I6522 Occlusion and stenosis of left carotid artery: Secondary | ICD-10-CM | POA: Diagnosis not present

## 2016-05-08 DIAGNOSIS — Z79899 Other long term (current) drug therapy: Secondary | ICD-10-CM

## 2016-05-08 DIAGNOSIS — R739 Hyperglycemia, unspecified: Secondary | ICD-10-CM

## 2016-05-08 DIAGNOSIS — I209 Angina pectoris, unspecified: Secondary | ICD-10-CM | POA: Diagnosis not present

## 2016-05-08 DIAGNOSIS — I25110 Atherosclerotic heart disease of native coronary artery with unstable angina pectoris: Secondary | ICD-10-CM | POA: Diagnosis not present

## 2016-05-08 DIAGNOSIS — Z79891 Long term (current) use of opiate analgesic: Secondary | ICD-10-CM

## 2016-05-08 DIAGNOSIS — I252 Old myocardial infarction: Secondary | ICD-10-CM | POA: Diagnosis not present

## 2016-05-08 DIAGNOSIS — R918 Other nonspecific abnormal finding of lung field: Secondary | ICD-10-CM | POA: Diagnosis not present

## 2016-05-08 DIAGNOSIS — Z4682 Encounter for fitting and adjustment of non-vascular catheter: Secondary | ICD-10-CM | POA: Diagnosis not present

## 2016-05-08 DIAGNOSIS — Z951 Presence of aortocoronary bypass graft: Secondary | ICD-10-CM | POA: Diagnosis not present

## 2016-05-08 DIAGNOSIS — I4891 Unspecified atrial fibrillation: Secondary | ICD-10-CM | POA: Diagnosis not present

## 2016-05-08 DIAGNOSIS — F17210 Nicotine dependence, cigarettes, uncomplicated: Secondary | ICD-10-CM | POA: Diagnosis not present

## 2016-05-08 DIAGNOSIS — I2510 Atherosclerotic heart disease of native coronary artery without angina pectoris: Secondary | ICD-10-CM | POA: Diagnosis not present

## 2016-05-08 DIAGNOSIS — Z720 Tobacco use: Secondary | ICD-10-CM

## 2016-05-08 LAB — GLUCOSE, CAPILLARY
GLUCOSE-CAPILLARY: 222 mg/dL — AB (ref 65–99)
Glucose-Capillary: 305 mg/dL — ABNORMAL HIGH (ref 65–99)

## 2016-05-08 LAB — BASIC METABOLIC PANEL
ANION GAP: 7 (ref 5–15)
BUN: 14 mg/dL (ref 6–20)
CO2: 27 mmol/L (ref 22–32)
Calcium: 9.1 mg/dL (ref 8.9–10.3)
Chloride: 102 mmol/L (ref 101–111)
Creatinine, Ser: 0.87 mg/dL (ref 0.61–1.24)
GLUCOSE: 318 mg/dL — AB (ref 65–99)
POTASSIUM: 3.5 mmol/L (ref 3.5–5.1)
SODIUM: 136 mmol/L (ref 135–145)

## 2016-05-08 LAB — CBC
HEMATOCRIT: 45.5 % (ref 39.0–52.0)
HEMOGLOBIN: 16.7 g/dL (ref 13.0–17.0)
MCH: 32.2 pg (ref 26.0–34.0)
MCHC: 36.7 g/dL — ABNORMAL HIGH (ref 30.0–36.0)
MCV: 87.8 fL (ref 78.0–100.0)
Platelets: 154 10*3/uL (ref 150–400)
RBC: 5.18 MIL/uL (ref 4.22–5.81)
RDW: 11.9 % (ref 11.5–15.5)
WBC: 8.5 10*3/uL (ref 4.0–10.5)

## 2016-05-08 LAB — TROPONIN I
TROPONIN I: 0.18 ng/mL — AB (ref <0.031)
TROPONIN I: 0.16 ng/mL — AB (ref <0.031)
Troponin I: 0.04 ng/mL — ABNORMAL HIGH (ref <0.031)

## 2016-05-08 LAB — PROTIME-INR
INR: 0.97 (ref 0.00–1.49)
PROTHROMBIN TIME: 13.1 s (ref 11.6–15.2)

## 2016-05-08 LAB — MAGNESIUM: MAGNESIUM: 1.8 mg/dL (ref 1.7–2.4)

## 2016-05-08 MED ORDER — GI COCKTAIL ~~LOC~~: 30.0000 mL | Freq: Four times a day (QID) | ORAL | Status: DC | PRN

## 2016-05-08 MED ORDER — ASPIRIN EC 325 MG PO TBEC: 325.0000 mg | DELAYED_RELEASE_TABLET | Freq: Every day | ORAL | Status: DC

## 2016-05-08 MED ORDER — INSULIN ASPART 100 UNIT/ML ~~LOC~~ SOLN
0.0000 [IU] | Freq: Three times a day (TID) | SUBCUTANEOUS | Status: DC
  Administered 2016-05-08: 7 [IU] via SUBCUTANEOUS
  Administered 2016-05-09: 3 [IU] via SUBCUTANEOUS

## 2016-05-08 MED ORDER — ACETAMINOPHEN 325 MG PO TABS: 650.0000 mg | ORAL_TABLET | ORAL | Status: DC | PRN

## 2016-05-08 MED ORDER — ASPIRIN 81 MG PO CHEW
324.0000 mg | CHEWABLE_TABLET | Freq: Once | ORAL | Status: AC
  Administered 2016-05-08: 324 mg via ORAL
  Filled 2016-05-08: qty 4

## 2016-05-08 MED ORDER — INSULIN ASPART 100 UNIT/ML ~~LOC~~ SOLN
0.0000 [IU] | Freq: Every day | SUBCUTANEOUS | Status: DC
Start: 2016-05-08 — End: 2016-05-09
  Administered 2016-05-08: 2 [IU] via SUBCUTANEOUS

## 2016-05-08 MED ORDER — NITROGLYCERIN 2 % TD OINT
1.0000 [in_us] | TOPICAL_OINTMENT | Freq: Four times a day (QID) | TRANSDERMAL | Status: DC
  Administered 2016-05-08: 1 [in_us] via TOPICAL
  Filled 2016-05-08: qty 1

## 2016-05-08 MED ORDER — NITROGLYCERIN 0.4 MG SL SUBL
0.4000 mg | SUBLINGUAL_TABLET | SUBLINGUAL | Status: DC | PRN
  Administered 2016-05-08: 0.4 mg via SUBLINGUAL
  Filled 2016-05-08: qty 1

## 2016-05-08 MED ORDER — INSULIN ASPART 100 UNIT/ML ~~LOC~~ SOLN
8.0000 [IU] | Freq: Once | SUBCUTANEOUS | Status: AC
  Administered 2016-05-08: 8 [IU] via INTRAVENOUS
  Filled 2016-05-08: qty 1

## 2016-05-08 MED ORDER — ONDANSETRON HCL 4 MG/2ML IJ SOLN: 4.0000 mg | Freq: Four times a day (QID) | INTRAMUSCULAR | Status: DC | PRN

## 2016-05-08 NOTE — Progress Notes (Signed)
Patient troponin level 0.18. Denies chest pain or other discomfort. Text-paged Dr. Irene LimboGoodrich to notify. Earnstine RegalAshley Ashleigh Luckow, RN

## 2016-05-08 NOTE — ED Notes (Signed)
Pt reports cp in center of chest with no radiation since this morning while at work, pt reports sob and diaphoresis, no cardiac hx reported from pt.

## 2016-05-08 NOTE — ED Notes (Signed)
MD at bedside. 

## 2016-05-08 NOTE — H&P (Signed)
History and Physical  [name] ONG:[id number] DOB: 05/01/1971 DOA: 05/08/2016  Referring physician: Jacalyn LefevreJulie Haviland, MD PCP: Lubertha SouthSteve Luking, MD   Chief Complaint: Chest pain  HPI:  [age] yom with history of hypertension and diet-controlled diabetes mellitus but no cardiac history presented with complaints of chest pain which was resolved in the emergency department. Initial troponin was slightly elevated and he was admitted for observation and further evaluation of chest pain.  Woke up this morning and felt fine, was able to eat without difficulty. Pain onset at work and orginially thought to be indigestion. Reported taking antiacid with no relief. Pain progressively worsened and he noticed increased sweating. Pain in central chest and described as pressure. Severity noted to be 3-4/10. Denies nausea, vomiting, left arm pain and numbness. This is new pain. Denies any heavy lifting and or SOB with activities. Walks several miles per day without chest pain or shortness of breath normally. Pain lasted one hour but has resolved with NTG and aspirin given in the ED.   In the emergency department: treated with ASA Pertinent labs: BMP, CBC unremarkable. Troponin .04. EKG: Independently reviewed. SR, no acute changes Imaging: CXR independently reviewed. No acute disease  Review of Systems:  Positive for chest pain, diaphoresis   Negative for fever, visual changes, sore throat, rash, new muscle aches, SOB, dysuria, bleeding, n/v/abdominal pain.  Past Medical History  Diagnosis Date  . Diabetes mellitus without complication (HCC)     diet controlled  . Hypertension     h/o of HTN, on no meds now.    Past Surgical History  Procedure Laterality Date  . Appendectomy    . Knee arthroscopy Left   . Ganglion cyst excision Left 10/12/2015    Procedure: EXCISION MUCOID TUMOR LEFT MIDDLE FINGER ;  Surgeon: [name], MD;  Location: [facility];  Service: Orthopedics;  Laterality: Left;   Left middle finger.    Social History:  reports that he has been smoking.  He does not have any smokeless tobacco history on file. He reports that he uses illicit drugs (Marijuana). He reports that he does not drink alcohol.  No Known Allergies  Family History  Problem Relation Age of Onset  . Diabetes Brother      Prior to Admission medications   Medication Sig Start Date End Date Taking? Authorizing Provider  glucosamine-chondroitin 500-400 MG tablet Take 1 tablet by mouth daily.   Yes Historical Provider, MD  naproxen sodium (ANAPROX) 220 MG tablet Take 440 mg by mouth daily as needed (headache/pain).   Yes Historical Provider, MD   Physical Exam: Filed Vitals:   05/08/16 0930 05/08/16 1000 05/08/16 1030 05/08/16 1146  BP: 145/91 136/90 146/85 161/87  Pulse: 85 76 70 74  Temp:    97.6 F (36.4 C)  TempSrc:    Oral  Resp: 21 20 17 20   Weight:      SpO2: 93% 99% 96% 98%   Constitutional:  . Appears calm and comfortable Eyes:  . PERRL and irises appear normal . Normal conjunctivae and lids ENMT:  . external ears, nose appear normal . grossly normal hearing Neck:  . neck appears normal, no masses, normal ROM, supple . no thyromegaly Respiratory:  . CTA bilaterally, no w/r/r.  . Respiratory effort normal. No retractions or accessory muscle use Cardiovascular:  . RRR, no m/r/g . No LE extremity edema   Abdomen:  . Abdomen appears normal; no tenderness or masses Musculoskeletal:  . RUE, LUE, RLE, LLE  o strength and tone normal, no atrophy, no abnormal movements o No tenderness, masses Skin:  . No rashes, lesions, ulcers . palpation of skin: no induration or nodules Neurologic:  . Appears grossly normal Psychiatric:  . judgement and insight appear normal . Mental status o Mood and affect appear appropriate  Wt Readings from Last 3 Encounters:  05/08/16 97.523 kg (215 lb)  10/18/15 97.523 kg (215 lb)  10/12/15 98.431 kg (217 lb)    Labs on Admission:    Basic Metabolic Panel:  Recent Labs Lab 05/08/16 0931  NA 136  K 3.5  CL 102  CO2 27  GLUCOSE 318*  BUN 14  CREATININE 0.87  CALCIUM 9.1  MG 1.8   CBC:  Recent Labs Lab 05/08/16 0931  WBC 8.5  HGB 16.7  HCT 45.5  MCV 87.8  PLT 154    Cardiac Enzymes:  Recent Labs Lab 05/08/16 0931  TROPONINI 0.04*    Radiological Exams on Admission: Dg Chest 2 View  05/08/2016  CLINICAL DATA:  Central chest pain with mild shortness of breast since this morning, smoker, diabetes mellitus, hypertension EXAM: CHEST  2 VIEW COMPARISON:  Prior on the time line from predate PACs and is not available for comparison FINDINGS: Normal heart size, mediastinal contours, and pulmonary vascularity. Lungs clear. No pleural effusion or pneumothorax. Bones unremarkable. IMPRESSION: No acute abnormalities. Electronically Signed   By: [name] M.D.   On: 05/08/2016 09:47      Principal Problem:   Chest pain Active Problems:   DM type 2 (diabetes mellitus, type 2) (HCC)   Assessment/Plan 1. Chest pain With some typical features, with minimal troponin elevation. Given ASA in ED, pain free after NTG. EKG nonacute. 2. DM type 2 diet-controlled, appears stable. 3. HTN not on meds. 4. Tobacco dependence    Obs, trend troponin, monitor for recurrent pain.  Cardiology consultation 6/8 further recommendations. Nothing by mouth after midnight in case stress testing proceeded.  Code Status:   DVT prophylaxis:SCDs Family Communication: No family bedside.  Disposition Plan/Anticipated LOS: Observation, discharge home in 23 hours.   Time spent: 55 minutes  Brendia Sacks, MD  [facility] Direct contact: [phone] --Via amion app OR  --[email or web address]; password [email or web address] and click  7PM-7AM contact night coverage as above  05/08/2016, 2:43 PM    By signing my name below, I, Zadie Cleverly attest that this documentation has been prepared under the direction and in the presence of  Brendia Sacks, MD Electronically signed: Zadie Cleverly  05/08/2016 1:40pm   I personally performed the services described in this documentation. All medical record entries made by the scribe were at my direction. I have reviewed the chart and agree that the record reflects my personal performance and is accurate and complete. Brendia Sacks, MD

## 2016-05-08 NOTE — Progress Notes (Signed)
Inpatient Diabetes Program Recommendations  AACE/ADA: New Consensus Statement on Inpatient Glycemic Control (2015)  Target Ranges:  Prepandial:   less than 140 mg/dL      Peak postprandial:   less than 180 mg/dL (1-2 hours)      Critically ill patients:  140 - 180 mg/dL   Results for [name], [name] (MRN [id number]) as of 05/08/2016 15:35  Ref. Range 05/08/2016 09:31  Glucose Latest Ref Range: 65-99 mg/dL 725318 (H)   Review of Glycemic Control  Diabetes history: DM2 Outpatient Diabetes medications: None (diet controlled) Current orders for Inpatient glycemic control: None  Inpatient Diabetes Program Recommendations: Correction (SSI): Initial glucose on labs was 318 mg/ld at 9:31 am today. While inpatient, please order CBGs with Nvoolog correction scale ACHS. HgbA1C: Please order an A1C (add on to blood in lab if possible) to evaluate glycemic control over the past 2-3 months.  Thanks, Orlando PennerMarie Gerardo Territo, RN, MSN, CDE Diabetes Coordinator Inpatient Diabetes Program [phone] (Team Pager from 8am to 5pm) [phone] ([facility] office) [phone] [facility] office) [phone] [facility] office)

## 2016-05-08 NOTE — ED Provider Notes (Signed)
CSN: [id number]     Arrival date & time 05/08/16  [id number] History  By signing my name below, I, [name], attest that this documentation has been prepared under the direction and in the presence of [name], MD.   Electronically Signed: [name], ED Scribe. 05/08/2016. 10:31 AM   Chief Complaint  Patient presents with  . Chest Pain   The history is provided by the patient. No language interpreter was used.   HPI Comments: [name] is a 45 y.o. male with PMHx of HTN and DM who presents to the Emergency Department complaining of gradual onset, non-radiating chest pain, beginning this morning while at work. He states the pain began after eating but has not alleviated. Pt reports the pain feels like a pressure in his chest. He also complains of mild shortness of breath and diaphoresis. No other associated symptoms noted. Pt states he took antacids PTA with no relief to symptoms. No other worsening or alleviating factors noted. Pt denies hx of heart problems, or any other pertinent symptoms.  Past Medical History  Diagnosis Date  . Diabetes mellitus without complication (HCC)     diet controlled  . Hypertension     h/o of HTN, on no meds now.   Past Surgical History  Procedure Laterality Date  . Appendectomy    . Knee arthroscopy Left   . Ganglion cyst excision Left 10/12/2015    Procedure: EXCISION MUCOID TUMOR LEFT MIDDLE FINGER ;  Surgeon: [name], MD;  Location: [facility];  Service: Orthopedics;  Laterality: Left;  Left middle finger.   History reviewed. No pertinent family history. Social History  Substance Use Topics  . Smoking status: Current Every Day Smoker -- 2.00 packs/day  . Smokeless tobacco: None  . Alcohol Use: No    Review of Systems  Constitutional: Positive for diaphoresis.  Respiratory: Positive for shortness of breath.   Cardiovascular: Positive for chest pain.  All other systems reviewed and are  negative.    Allergies  Review of patient's allergies indicates no known allergies.  Home Medications   Prior to Admission medications   Medication Sig Start Date End Date Taking? Authorizing Provider  glucosamine-chondroitin 500-400 MG tablet Take 1 tablet by mouth daily.   Yes Historical Provider, MD  naproxen sodium (ANAPROX) 220 MG tablet Take 440 mg by mouth daily as needed (headache/pain).   Yes Historical Provider, MD   BP 199/101 mmHg  Pulse 78  Temp(Src) 97.8 F (36.6 C) (Oral)  Resp 20  Wt 215 lb (97.523 kg)  SpO2 99% Physical Exam  Constitutional: He is oriented to person, place, and time. He appears well-developed and well-nourished.  HENT:  Head: Normocephalic.  Right Ear: External ear normal.  Left Ear: External ear normal.  Eyes: EOM are normal.  Neck: Normal range of motion.  Cardiovascular: Normal rate, regular rhythm and normal heart sounds.  Exam reveals no gallop.   No murmur heard. Pulmonary/Chest: Effort normal. No respiratory distress. He has wheezes. He has no rales.  Minimal wheezing noted.  Abdominal: Soft. Bowel sounds are normal. He exhibits no distension. There is no tenderness.  Musculoskeletal: Normal range of motion.  Neurological: He is alert and oriented to person, place, and time.  Psychiatric: He has a normal mood and affect.  Nursing note and vitals reviewed.   ED Course  Procedures (including critical care time) DIAGNOSTIC STUDIES: Oxygen Saturation is 99% on RA, normal by my interpretation.    COORDINATION OF  CARE: 9:16 AM Discussed treatment plan with pt at bedside and pt agreed to plan.  Labs Review Labs Reviewed  BASIC METABOLIC PANEL - Abnormal; Notable for the following:    Glucose, Bld 318 (*)    All other components within normal limits  CBC - Abnormal; Notable for the following:    MCHC 36.7 (*)    All other components within normal limits  TROPONIN I - Abnormal; Notable for the following:    Troponin I 0.04 (*)     All other components within normal limits  MAGNESIUM  PROTIME-INR  URINALYSIS, ROUTINE W REFLEX MICROSCOPIC (NOT AT [facility])    Imaging Review Dg Chest 2 View  05/08/2016  CLINICAL DATA:  Central chest pain with mild shortness of breast since this morning, smoker, diabetes mellitus, hypertension EXAM: CHEST  2 VIEW COMPARISON:  Prior on the time line from predate PACs and is not available for comparison FINDINGS: Normal heart size, mediastinal contours, and pulmonary vascularity. Lungs clear. No pleural effusion or pneumothorax. Bones unremarkable. IMPRESSION: No acute abnormalities. Electronically Signed   By: [name] M.D.   On: 05/08/2016 09:47   I have personally reviewed and evaluated these images and lab results as part of my medical decision-making.   EKG Interpretation   Date/Time:  Wednesday May 08 2016 09:12:21 EDT Ventricular Rate:  80 PR Interval:  129 QRS Duration: 106 QT Interval:  393 QTC Calculation: 453 R Axis:   84 Text Interpretation:  Sinus rhythm Borderline repolarization abnormality  Confirmed by [name] MD, [name] ([id number]) on 05/08/2016 9:24:14 AM      MDM  PT D/W DR. Irene Limbo ([facility]) WHO WILL ADMIT PT FOR OBS. Final diagnoses:  NSTEMI (non-ST elevated myocardial infarction) (HCC)  Hyperglycemia  Essential hypertension  Tobacco abuse      Jacalyn Lefevre, MD 05/08/16 1031

## 2016-05-09 ENCOUNTER — Telehealth: Payer: Self-pay | Admitting: *Deleted

## 2016-05-09 ENCOUNTER — Other Ambulatory Visit: Payer: Self-pay

## 2016-05-09 ENCOUNTER — Inpatient Hospital Stay (HOSPITAL_COMMUNITY)
Admission: EM | Admit: 2016-05-09 | Discharge: 2016-05-17 | DRG: 234 | Disposition: A | Discharge status: Home / Self Care | Payer: BLUE CROSS/BLUE SHIELD | Attending: Cardiothoracic Surgery | Admitting: Cardiothoracic Surgery

## 2016-05-09 ENCOUNTER — Emergency Department (HOSPITAL_BASED_OUTPATIENT_CLINIC_OR_DEPARTMENT_OTHER): Payer: BLUE CROSS/BLUE SHIELD

## 2016-05-09 ENCOUNTER — Emergency Department (HOSPITAL_COMMUNITY): Payer: BLUE CROSS/BLUE SHIELD

## 2016-05-09 ENCOUNTER — Telehealth: Payer: Self-pay | Admitting: Family Medicine

## 2016-05-09 ENCOUNTER — Encounter (HOSPITAL_COMMUNITY): Payer: Self-pay | Admitting: Family Medicine

## 2016-05-09 DIAGNOSIS — E1165 Type 2 diabetes mellitus with hyperglycemia: Secondary | ICD-10-CM | POA: Diagnosis present

## 2016-05-09 DIAGNOSIS — R079 Chest pain, unspecified: Secondary | ICD-10-CM | POA: Diagnosis present

## 2016-05-09 DIAGNOSIS — Z720 Tobacco use: Secondary | ICD-10-CM | POA: Diagnosis not present

## 2016-05-09 DIAGNOSIS — E118 Type 2 diabetes mellitus with unspecified complications: Secondary | ICD-10-CM | POA: Diagnosis not present

## 2016-05-09 DIAGNOSIS — Z951 Presence of aortocoronary bypass graft: Secondary | ICD-10-CM

## 2016-05-09 DIAGNOSIS — I10 Essential (primary) hypertension: Secondary | ICD-10-CM | POA: Diagnosis present

## 2016-05-09 DIAGNOSIS — I214 Non-ST elevation (NSTEMI) myocardial infarction: Principal | ICD-10-CM | POA: Insufficient documentation

## 2016-05-09 DIAGNOSIS — I6522 Occlusion and stenosis of left carotid artery: Secondary | ICD-10-CM | POA: Diagnosis present

## 2016-05-09 DIAGNOSIS — I4891 Unspecified atrial fibrillation: Secondary | ICD-10-CM | POA: Diagnosis present

## 2016-05-09 DIAGNOSIS — I252 Old myocardial infarction: Secondary | ICD-10-CM

## 2016-05-09 DIAGNOSIS — Z8249 Family history of ischemic heart disease and other diseases of the circulatory system: Secondary | ICD-10-CM

## 2016-05-09 DIAGNOSIS — I25110 Atherosclerotic heart disease of native coronary artery with unstable angina pectoris: Secondary | ICD-10-CM | POA: Diagnosis present

## 2016-05-09 DIAGNOSIS — I119 Hypertensive heart disease without heart failure: Secondary | ICD-10-CM | POA: Diagnosis not present

## 2016-05-09 DIAGNOSIS — E1100 Type 2 diabetes mellitus with hyperosmolarity without nonketotic hyperglycemic-hyperosmolar coma (NKHHC): Secondary | ICD-10-CM

## 2016-05-09 DIAGNOSIS — R072 Precordial pain: Secondary | ICD-10-CM

## 2016-05-09 DIAGNOSIS — F17210 Nicotine dependence, cigarettes, uncomplicated: Secondary | ICD-10-CM | POA: Diagnosis present

## 2016-05-09 DIAGNOSIS — R0789 Other chest pain: Secondary | ICD-10-CM | POA: Diagnosis not present

## 2016-05-09 DIAGNOSIS — I2510 Atherosclerotic heart disease of native coronary artery without angina pectoris: Secondary | ICD-10-CM | POA: Insufficient documentation

## 2016-05-09 DIAGNOSIS — E8881 Metabolic syndrome: Secondary | ICD-10-CM | POA: Diagnosis present

## 2016-05-09 DIAGNOSIS — F419 Anxiety disorder, unspecified: Secondary | ICD-10-CM | POA: Diagnosis present

## 2016-05-09 DIAGNOSIS — Z79899 Other long term (current) drug therapy: Secondary | ICD-10-CM

## 2016-05-09 DIAGNOSIS — E785 Hyperlipidemia, unspecified: Secondary | ICD-10-CM | POA: Diagnosis present

## 2016-05-09 DIAGNOSIS — F17200 Nicotine dependence, unspecified, uncomplicated: Secondary | ICD-10-CM | POA: Diagnosis not present

## 2016-05-09 LAB — BASIC METABOLIC PANEL
Anion gap: 9 (ref 5–15)
BUN: 11 mg/dL (ref 6–20)
CHLORIDE: 101 mmol/L (ref 101–111)
CO2: 25 mmol/L (ref 22–32)
CREATININE: 1 mg/dL (ref 0.61–1.24)
Calcium: 9.2 mg/dL (ref 8.9–10.3)
GFR calc non Af Amer: >60 mL/min (ref >60)
Glucose, Bld: 295 mg/dL — ABNORMAL HIGH (ref 65–99)
POTASSIUM: 4.2 mmol/L (ref 3.5–5.1)
Sodium: 135 mmol/L (ref 135–145)

## 2016-05-09 LAB — TROPONIN I
TROPONIN I: 0.17 ng/mL — AB (ref <0.031)
TROPONIN I: 0.1 ng/mL — AB (ref <0.031)
Troponin I: 0.09 ng/mL — ABNORMAL HIGH (ref <0.031)

## 2016-05-09 LAB — ECHOCARDIOGRAM COMPLETE
CHL CUP MV DEC (S): 236
E decel time: 236 msec
E/e' ratio: 6.19
FS: 33 % (ref 28–44)
HEIGHTINCHES: 68.898 in
IVS/LV PW RATIO, ED: 1.03
LA vol A4C: 37.6 ml
LADIAMINDEX: 2.02 cm/m2
LASIZE: 43 mm
LAVOL: 45.9 mL
LAVOLIN: 21.5 mL/m2
LEFT ATRIUM END SYS DIAM: 43 mm
LV PW d: 11 mm — AB (ref 0.6–1.1)
LV e' LATERAL: 10.2 cm/s
LVEEAVG: 6.19
LVEEMED: 6.19
LVOT area: 3.8 cm2
LVOT diameter: 22 mm
MVPKAVEL: 54.8 m/s
MVPKEVEL: 63.1 m/s
RV TAPSE: 19.5 mm
TDI e' lateral: 10.2
TDI e' medial: 7.83

## 2016-05-09 LAB — CBC
HEMATOCRIT: 46.2 % (ref 39.0–52.0)
HEMOGLOBIN: 16.3 g/dL (ref 13.0–17.0)
MCH: 30.6 pg (ref 26.0–34.0)
MCHC: 35.3 g/dL (ref 30.0–36.0)
MCV: 86.7 fL (ref 78.0–100.0)
PLATELETS: 153 10*3/uL (ref 150–400)
RBC: 5.33 MIL/uL (ref 4.22–5.81)
RDW: 12 % (ref 11.5–15.5)
WBC: 8.1 10*3/uL (ref 4.0–10.5)

## 2016-05-09 LAB — I-STAT TROPONIN, ED: Troponin i, poc: 0.1 ng/mL (ref 0.00–0.08)

## 2016-05-09 LAB — GLUCOSE, CAPILLARY
GLUCOSE-CAPILLARY: 188 mg/dL — AB (ref 65–99)
Glucose-Capillary: 226 mg/dL — ABNORMAL HIGH (ref 65–99)

## 2016-05-09 LAB — HEPARIN LEVEL (UNFRACTIONATED): HEPARIN UNFRACTIONATED: 0.19 [IU]/mL — AB (ref 0.30–0.70)

## 2016-05-09 LAB — CBG MONITORING, ED: GLUCOSE-CAPILLARY: 186 mg/dL — AB (ref 65–99)

## 2016-05-09 MED ORDER — MORPHINE SULFATE (PF) 2 MG/ML IV SOLN: 2.0000 mg | INTRAVENOUS | Status: DC | PRN

## 2016-05-09 MED ORDER — INSULIN ASPART 100 UNIT/ML ~~LOC~~ SOLN
0.0000 [IU] | Freq: Three times a day (TID) | SUBCUTANEOUS | Status: DC
  Administered 2016-05-09 – 2016-05-10 (×2): 2 [IU] via SUBCUTANEOUS
  Administered 2016-05-10 – 2016-05-11 (×2): 3 [IU] via SUBCUTANEOUS
  Administered 2016-05-11: 2 [IU] via SUBCUTANEOUS
  Administered 2016-05-11 – 2016-05-12 (×3): 1 [IU] via SUBCUTANEOUS
  Administered 2016-05-12: 2 [IU] via SUBCUTANEOUS
  Filled 2016-05-09: qty 1

## 2016-05-09 MED ORDER — METOPROLOL TARTRATE 25 MG PO TABS
25.0000 mg | ORAL_TABLET | Freq: Once | ORAL | Status: AC
Start: 2016-05-09 — End: 2016-05-09
  Administered 2016-05-09: 25 mg via ORAL
  Filled 2016-05-09: qty 1

## 2016-05-09 MED ORDER — HEPARIN BOLUS VIA INFUSION
4000.0000 [IU] | Freq: Once | INTRAVENOUS | Status: AC
  Administered 2016-05-09: 4000 [IU] via INTRAVENOUS
  Filled 2016-05-09: qty 4000

## 2016-05-09 MED ORDER — ONDANSETRON HCL 4 MG/2ML IJ SOLN
4.0000 mg | Freq: Four times a day (QID) | INTRAMUSCULAR | Status: DC | PRN
Start: 2016-05-09 — End: 2016-05-13

## 2016-05-09 MED ORDER — ASPIRIN EC 325 MG PO TBEC
325.0000 mg | DELAYED_RELEASE_TABLET | Freq: Every day | ORAL | Status: DC
  Administered 2016-05-10: 325 mg via ORAL
  Filled 2016-05-09: qty 1

## 2016-05-09 MED ORDER — INSULIN ASPART 100 UNIT/ML ~~LOC~~ SOLN
0.0000 [IU] | Freq: Every day | SUBCUTANEOUS | Status: DC
  Administered 2016-05-11 – 2016-05-12 (×2): 2 [IU] via SUBCUTANEOUS

## 2016-05-09 MED ORDER — GI COCKTAIL ~~LOC~~
30.0000 mL | Freq: Four times a day (QID) | ORAL | Status: DC | PRN
  Filled 2016-05-09: qty 30

## 2016-05-09 MED ORDER — ACETAMINOPHEN 325 MG PO TABS: 650.0000 mg | ORAL_TABLET | ORAL | Status: DC | PRN

## 2016-05-09 MED ORDER — HEPARIN (PORCINE) IN NACL 100-0.45 UNIT/ML-% IJ SOLN
1700.0000 [IU]/h | INTRAMUSCULAR | Status: DC
  Administered 2016-05-09: 1200 [IU]/h via INTRAVENOUS
  Filled 2016-05-09 (×2): qty 250

## 2016-05-09 MED ORDER — METOPROLOL TARTRATE 25 MG PO TABS
25.0000 mg | ORAL_TABLET | Freq: Two times a day (BID) | ORAL | Status: DC
  Administered 2016-05-09 – 2016-05-10 (×2): 25 mg via ORAL
  Filled 2016-05-09 (×2): qty 1

## 2016-05-09 MED ORDER — LORAZEPAM 1 MG PO TABS: 1.0000 mg | ORAL_TABLET | Freq: Four times a day (QID) | ORAL | Status: DC | PRN

## 2016-05-09 MED ORDER — ASPIRIN 81 MG PO CHEW
324.0000 mg | CHEWABLE_TABLET | Freq: Once | ORAL | Status: AC
  Administered 2016-05-09: 324 mg via ORAL
  Filled 2016-05-09: qty 4

## 2016-05-09 MED ORDER — LORAZEPAM 2 MG/ML IJ SOLN
1.0000 mg | Freq: Once | INTRAMUSCULAR | Status: DC
  Filled 2016-05-09: qty 1

## 2016-05-09 MED ORDER — LORAZEPAM 1 MG PO TABS
1.0000 mg | ORAL_TABLET | Freq: Once | ORAL | Status: AC
  Administered 2016-05-09: 1 mg via ORAL
  Filled 2016-05-09: qty 1

## 2016-05-09 MED ORDER — NICOTINE 21 MG/24HR TD PT24
21.0000 mg | MEDICATED_PATCH | Freq: Every day | TRANSDERMAL | Status: DC
  Administered 2016-05-09 – 2016-05-16 (×6): 21 mg via TRANSDERMAL
  Filled 2016-05-09 (×8): qty 1

## 2016-05-09 NOTE — Discharge Summary (Signed)
45 yom with history of hypertension and diet-controlled diabetes mellitus but no cardiac history presented with complaints of chest pain which was resolved in the emergency department. Initial troponin was slightly elevated and he was admitted for observation and further evaluation of chest pain. Pertinent labs: BMP, CBC unremarkable. Troponin .04.>0.18>0.17 EKG: Independently reviewed. SR, no acute changes Imaging: CXR independently reviewed. No acute disease exposure  Received a call from RN that the patient would like to leave AGAINST MEDICAL ADVICE  Patient does not want to stay in the hospital and feels anxious and claustrophobic He is alert and oriented 3 He would not want to be examined or be treated for any of his underlying conditions He understands the risk of sudden cardiac death if he leaves the hospital.

## 2016-05-09 NOTE — ED Notes (Signed)
Report attempted x3

## 2016-05-09 NOTE — ED Provider Notes (Signed)
CSN: [id number]     Arrival date & time 05/09/16  1229 History   First MD Initiated Contact with Patient 05/09/16 1449     Chief Complaint  Patient presents with  . Chest Pain     (Consider location/radiation/quality/duration/timing/severity/associated sxs/prior Treatment) Patient is a 45 y.o. male presenting with chest pain. The history is provided by the patient and the spouse.  Chest Pain He had chest discomfort yesterday while at work. He described an indigestion feeling was associated with a tight feeling across his lower chest. He rated the discomfort at 3/10. There is no dyspnea, nausea, diaphoresis. [facility] where he [facility] but got frustrated because he did not see a cardiologist and ovary could tell him when he was going to see a cardiologist so he left against advice. He has not had any chest discomfort since leaving. The original discomfort was relieved by a single nitroglycerin tablet in the emergency department. He called his PCP today who advised him to take an aspirin and [facility]. He did not take the aspirin but did come to the hospital. He does have significant cardiac risk factors of tobacco abuse, diet controlled hypertension, diet-controlled diabetes. There is no family history of premature coronary atherosclerosis and no history of hyperlipidemia.  Past Medical History  Diagnosis Date  . Diabetes mellitus without complication (HCC)     diet controlled  . Hypertension     h/o of HTN, on no meds now.   Past Surgical History  Procedure Laterality Date  . Appendectomy    . Knee arthroscopy Left   . Ganglion cyst excision Left 10/12/2015    Procedure: EXCISION MUCOID TUMOR LEFT MIDDLE FINGER ;  Surgeon: [name], MD;  Location: [facility];  Service: Orthopedics;  Laterality: Left;  Left middle finger.   Family History  Problem Relation Age of Onset  . Diabetes Brother    Social History  Substance Use  Topics  . Smoking status: Current Every Day Smoker -- 2.00 packs/day  . Smokeless tobacco: None  . Alcohol Use: No    Review of Systems  Cardiovascular: Positive for chest pain.  All other systems reviewed and are negative.     Allergies  Review of patient's allergies indicates no known allergies.  Home Medications   Prior to Admission medications   Medication Sig Start Date End Date Taking? Authorizing Provider  glucosamine-chondroitin 500-400 MG tablet Take 1 tablet by mouth daily.    Historical Provider, MD  naproxen sodium (ANAPROX) 220 MG tablet Take 440 mg by mouth daily as needed (headache/pain).    Historical Provider, MD   BP 164/98 mmHg  Pulse 82  Resp 17  Ht 5' 8.9" (1.75 m)  SpO2 99% Physical Exam  Nursing note and vitals reviewed.  45 year old male, resting comfortably and in no acute distress. Vital signs are significant for hypertension. Oxygen saturation is 99%, which is normal. Head is normocephalic and atraumatic. PERRLA, EOMI. Oropharynx is clear. Neck is nontender and supple without adenopathy or JVD. Back is nontender and there is no CVA tenderness. Lungs are clear without rales, wheezes, or rhonchi. Chest is nontender. Heart has regular rate and rhythm without murmur. Abdomen is soft, flat, nontender without masses or hepatosplenomegaly and peristalsis is normoactive. Extremities have no cyanosis or edema, full range of motion is present. Skin is warm and dry without rash. Neurologic: Mental status is normal, cranial nerves are intact, there are no motor  or sensory deficits.  ED Course  Procedures (including critical care time) Labs Review Results for orders placed or performed during the hospital encounter of 05/09/16  Basic metabolic panel  Result Value Ref Range   Sodium 135 135 - 145 mmol/L   Potassium 4.2 3.5 - 5.1 mmol/L   Chloride 101 101 - 111 mmol/L   CO2 25 22 - 32 mmol/L   Glucose, Bld 295 (H) 65 - 99 mg/dL   BUN 11 6 - 20 mg/dL    Creatinine, Ser 6.96 0.61 - 1.24 mg/dL   Calcium 9.2 8.9 - 29.5 mg/dL   GFR calc non Af Amer >60 >60 mL/min   GFR calc Af Amer >60 >60 mL/min   Anion gap 9 5 - 15  CBC  Result Value Ref Range   WBC 8.1 4.0 - 10.5 K/uL   RBC 5.33 4.22 - 5.81 MIL/uL   Hemoglobin 16.3 13.0 - 17.0 g/dL   HCT 28.4 13.2 - 44.0 %   MCV 86.7 78.0 - 100.0 fL   MCH 30.6 26.0 - 34.0 pg   MCHC 35.3 30.0 - 36.0 g/dL   RDW 10.2 72.5 - 36.6 %   Platelets 153 150 - 400 K/uL  I-stat troponin, ED  Result Value Ref Range   Troponin i, poc 0.10 (HH) 0.00 - 0.08 ng/mL   Comment NOTIFIED PHYSICIAN    Comment 3            Imaging Review Dg Chest 2 View  05/08/2016  CLINICAL DATA:  Central chest pain with mild shortness of breast since this morning, smoker, diabetes mellitus, hypertension EXAM: CHEST  2 VIEW COMPARISON:  Prior on the time line from predate PACs and is not available for comparison FINDINGS: Normal heart size, mediastinal contours, and pulmonary vascularity. Lungs clear. No pleural effusion or pneumothorax. Bones unremarkable. IMPRESSION: No acute abnormalities. Electronically Signed   By: [name] M.D.   On: 05/08/2016 09:47   I have personally reviewed and evaluated these images and lab results as part of my medical decision-making.   EKG Interpretation   Date/Time:  Thursday May 09 2016 13:07:58 EDT Ventricular Rate:  93 PR Interval:  130 QRS Duration: 96 QT Interval:  354 QTC Calculation: 440 R Axis:   95 Text Interpretation:  Normal sinus rhythm Rightward axis Borderline ECG  When compared with ECG of 05/08/2016, No significant change was found  Confirmed by [facility]  MD, Mishon Blubaugh ([id number]) on 05/09/2016 2:49:23 PM      MDM   Final diagnoses:  Chest pain  CAD (coronary artery disease)  Chest pain, unspecified chest pain type    Non-STEMI. Old records were reviewed and he presented to the emergency department at [facility] yesterday with a troponin of 0.04 which increased to 0.17.  He did leave AGAINST MEDICAL ADVICE. Blood sugar was moderately elevated to slightly over 300. Today, i-STAT troponin is 0.10. Will repeat main lab troponin which can more accurately be compared to troponins from yesterday. ECG shows no acute changes. He is started on heparin and is given aspirin. Cardiology has been consulted to admit the patient and be considered for elective catheterization.    Dione Booze, MD 05/11/16 [phone]

## 2016-05-09 NOTE — ED Notes (Signed)
Pt here for chest pain that started yesterday. sts was seen at [facility] and admitted due to elevated troponin. sts he was unhappy because no body was telling him anything and he hadn't seen a doctor in 24 hours so he left and came here. Pt anxious and irritated per himself. sts he feels fine now just irritated.

## 2016-05-09 NOTE — Progress Notes (Signed)
  Echocardiogram 2D Echocardiogram has been performed.  Arvil ChacoFoster, Lotus Santillo 05/09/2016, 5:00 PM

## 2016-05-09 NOTE — Telephone Encounter (Signed)
This patient had positive troponin upon review of his hospital record it was felt to be a non-ST elevated MI. Patient left AMA. I talked with the wife in detail. The patient is not having any chest symptoms currently but I told her that it is imperative that she bring him to the emergency department he should be admitted to the hospital and should also be evaluated for the possibility of blockages he may need a stress test or possible catheterization. Not going could increase the risk of sudden death. Wife agreed to take [facility]. Tree I Ree KidaJack at ER was called to alert them that the patient was coming.

## 2016-05-09 NOTE — Progress Notes (Addendum)
Patient left AMA, was informed of his medical risks and elevated troponins, and pt still signed out AMA.  Paged MD and doctor wanted him to follow up with his primary since he decided to leave AMA. MD attempted to come speak with patient before leaving, but pt had already signed out AMA and left the floor.  Pt stated he "is claustrophobic" and needed to get outside. Removed pt IV and site intact.

## 2016-05-09 NOTE — Telephone Encounter (Signed)
Dr Lorin PicketScott,  Darl PikesSusan called to let you know that [facility] and she will  Update you later on   Thanks

## 2016-05-09 NOTE — H&P (Signed)
History and Physical    [name] ZOX:[id number] DOB: [birth year] DOA: 05/09/2016   PCP: Lubertha South, MD   Patient coming from/Resides with: Private residence/lives with wife  Chief Complaint: Chest pain  HPI: [name] is a 45 y.o. male with medical history significant for diabetes mellitus who stopped taking his metformin 2 years ago but he did lose 50 pounds over the past year, ongoing tobacco abuse, positive family history for MI who initially presented to the [facility] ER on 6/7 after developing chest pain while at work. He states that around 8:30 yesterday morning he was very active at work when he developed heaviness in the center of his chest that did not radiate; he described this as "an anvil on my chest". He states this pain was associated with shortness of breath and sweatiness. He sat down and the pain did not relieve. He subsequently was transported via EMS to the [facility] ER and apparently received nitroglycerin in route and by the time he arrived his chest pain had resolved-this occurred over about 1 hour. On his initial EKG he had nonspecific ST segment changes in the inferior leads consisting of flattening out of the ST segments in leads 2 and aVF with somewhat of a downsloping of the ST segment in lead 3. Patient left AGAINST MEDICAL ADVICE early this morning before a physician could speak with him; per his report/perception he felt like treatments were being conducted on him without thorough explanation and he wanted to leave the floor to go outside and smoke but was instructed he could not do that.   His wife accompanies him today and is assisting with the history. Patient was taking metformin until 2 years ago when his sugars became better controlled and he felt he did not need the medication. He occasionally checks his sugars but not consistently. He did not know what a hemoglobin A1c was and he continues to smoke. He reports he's had diabetes for 5 years. His wife  convinced him he needed to return to the ER for treatment.  ED Course:  Since arrival to [facility]: BP 164/98-pulse 82-respirations 17-room air saturations 99% 2 view chest x-ray obtained on 6/7: No acute abnormalities Lab results: Initial troponin was 0.04 and have been cycled for a total of 6 collections with a peak 0.18 and today's troponin is 0.10. Sodium 135, potassium 4.2, BUN 11, creatinine 1.0, glucose 295 with glucose yesterday 318, WBCs 8100 no differential obtained, hemoglobin 16.3, platelets 153,000 Aspirin 324 mg by mouth 1 Ativan 1 mg IV 1 Heparin infusion 1200 units per hour after 4000 unit IV bolus  Review of Systems:  In addition to the HPI above,  No Fever-chills, myalgias or other constitutional symptoms No Headache, changes with Vision or hearing, new weakness, tingling, numbness in any extremity, No problems swallowing food or Liquids, indigestion/reflux No Cough, palpitations, orthopnea or DOE No Abdominal pain, N/V; no melena or hematochezia, no dark tarry stools, Bowel movements are regular, No dysuria, hematuria or flank pain No new skin rashes, lesions, masses or bruises, No new joints pains-aches No recent weight gain  No polyuria, polydypsia or polyphagia,   Past Medical History  Diagnosis Date  . Diabetes mellitus without complication (HCC)     diet controlled  . Hypertension     h/o of HTN, on no meds now.    Past Surgical History  Procedure Laterality Date  . Appendectomy    . Knee arthroscopy Left   . Ganglion cyst excision  Left 10/12/2015    Procedure: EXCISION MUCOID TUMOR LEFT MIDDLE FINGER ;  Surgeon: Cindee Salt, MD;  Location:  [facility];  Service: Orthopedics;  Laterality: Left;  Left middle finger.    Social History   Social History  . Marital Status: Married    Spouse Name: N/A  . Number of Children: N/A  . Years of Education: N/A   Occupational History  . Not on file.   Social History Main Topics  . Smoking  status: Current Every Day Smoker -- 2.00 packs/day  . Smokeless tobacco: Not on file  . Alcohol Use: No  . Drug Use: Yes    Special: Marijuana     Comment: occasionally marjuana  . Sexual Activity: Not on file   Other Topics Concern  . 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 Diabetes Brother    Family history reviewed and is positive for father with history of CAD and MI with ischemic cardiomyopathy  Prior to Admission medications   Medication Sig Start Date End Date Taking? Authorizing Provider  glucosamine-chondroitin 500-400 MG tablet Take 1 tablet by mouth daily.   Yes Historical Provider, MD  naproxen sodium (ANAPROX) 220 MG tablet Take 440 mg by mouth daily as needed (headache/pain).   Yes Historical Provider, MD    Physical Exam: Filed Vitals:   05/09/16 1515 05/09/16 1545 05/09/16 1630 05/09/16 1633  BP: 168/100 141/97 178/109 178/109  Pulse: 79 70 98 81  Resp: 16 11 24 19   Height:      SpO2: 96% 99% 98% 99%      Constitutional: NAD, calm, comfortable-Somewhat anxious but calmed down after thorough explanation of current medical condition and plan of care Eyes: PERRL, lids and conjunctivae normal ENMT: Mucous membranes are moist. Posterior pharynx clear of any exudate or lesions.Normal dentition.  Neck: normal, supple, no masses, no thyromegaly Respiratory: clear to auscultation bilaterally, no wheezing, no crackles. Normal respiratory effort. No accessory muscle use.  Cardiovascular: Regular rate and rhythm, no murmurs / rubs / gallops. No extremity edema. 2+ pedal pulses. No carotid bruits.  Abdomen: no tenderness, no masses palpated. No hepatosplenomegaly. Bowel sounds positive.  Musculoskeletal: no clubbing / cyanosis. No joint deformity upper and lower  extremities. Good ROM, no contractures. Normal muscle tone.  Skin: no rashes, lesions, ulcers. No induration Neurologic: CN 2-12 grossly intact. Sensation intact, DTR normal. Strength 5/5 x all 4 extremities.  Psychiatric: Normal judgment and insight. Alert and oriented x 3. Normal mood.    Labs on Admission: I have personally reviewed following labs and imaging studies  CBC:  Recent Labs Lab 05/08/16 0931 05/09/16 1327  WBC 8.5 8.1  HGB 16.7 16.3  HCT 45.5 46.2  MCV 87.8 86.7  PLT 154 153   Basic Metabolic Panel:  Recent Labs Lab 05/08/16 0931 05/09/16 1327  NA 136 135  K 3.5 4.2  CL 102 101  CO2 27 25  GLUCOSE 318* 295*  BUN 14 11  CREATININE 0.87 1.00  CALCIUM 9.1 9.2  MG 1.8  --    GFR: Estimated Creatinine Clearance: 106.1 mL/min (by C-G formula based on Cr of 1). Liver Function Tests: No results for input(s): AST, ALT, ALKPHOS, BILITOT, PROT, ALBUMIN in the last 168 hours. No results for input(s): LIPASE, AMYLASE  in the last 168 hours. No results for input(s): AMMONIA in the last 168 hours. Coagulation Profile:  Recent Labs Lab 05/08/16 0931  INR 0.97   Cardiac Enzymes:  Recent Labs Lab 05/08/16 0931 05/08/16 1448 05/08/16 2056 05/09/16 0302 05/09/16 1533  TROPONINI 0.04* 0.18* 0.16* 0.17* 0.10*   BNP (last 3 results) No results for input(s): PROBNP in the last 8760 hours. HbA1C: No results for input(s): HGBA1C in the last 72 hours. CBG:  Recent Labs Lab 05/08/16 1647 05/08/16 2121 05/09/16 0811  GLUCAP 305* 222* 226*   Lipid Profile: No results for input(s): CHOL, HDL, LDLCALC, TRIG, CHOLHDL, LDLDIRECT in the last 72 hours. Thyroid Function Tests: No results for input(s): TSH, T4TOTAL, FREET4, T3FREE, THYROIDAB in the last 72 hours. Anemia Panel: No results for input(s): VITAMINB12, FOLATE, FERRITIN, TIBC, IRON, RETICCTPCT in the last 72 hours. Urine analysis: No results found for: COLORURINE, APPEARANCEUR, LABSPEC, PHURINE,  GLUCOSEU, HGBUR, BILIRUBINUR, KETONESUR, PROTEINUR, UROBILINOGEN, NITRITE, LEUKOCYTESUR Sepsis Labs: @LABRCNTIP (procalcitonin:4,lacticidven:4) )No results found for this or any previous visit (from the past 240 hour(s)).   Radiological Exams on Admission: Dg Chest 2 View  05/08/2016  CLINICAL DATA:  Central chest pain with mild shortness of breast since this morning, smoker, diabetes mellitus, hypertension EXAM: CHEST  2 VIEW COMPARISON:  Prior on the time line from predate PACs and is not available for comparison FINDINGS: Normal heart size, mediastinal contours, and pulmonary vascularity. Lungs clear. No pleural effusion or pneumothorax. Bones unremarkable. IMPRESSION: No acute abnormalities. Electronically Signed   By: Ulyses SouthwardMark  Boles M.D.   On: 05/08/2016 09:47    EKG: (Independently reviewed) Repeat EKG today without chest pain shows normal sinus rhythm with ventricular rate 93 bpm, QTC 440 ms, previous flattening of ST segments in leads 2 through aVF have resolved otherwise no ischemic changes, right axis deviation  Assessment/Plan Principal Problem:   Chest pain -Patient presented yesterday with chest pain concerning for typical ischemia with mild bump in troponins and nonspecific EKG changes in the inferior leads -Heart score is 6 -Risk factors include: Positive family history of CAD, uncontrolled diabetes, ongoing tobacco abuse, lipid status unknown -Continue IV heparin -Echocardiogram -I have discussed with cardiology and patient will undergo Myoview stress test on 6/9 -Rationale for workup including rationale for patient not leaving the floor/hospital to smoke explained to the patient and he agreed to remain hospitalized to complete cardiac workup -Continue aspirin -Start Lopressor -Currently chest pain-free  Active Problems:   Diabetes mellitus type II, uncontrolled  -CBGs in the past 24 hours have been elevated between 295 and 318 -Check hemoglobin A1c; I anticipate this will be  elevated and may be high enough he may require combination of long-acting insulin with metformin until sugars become better controlled-I thoroughly explained to the patient and his wife that there is a potential patient could be discharged home on insulin pending hemoglobin A1c results -Patient does not have thorough understanding of his underlying disease process so I've asked diabetes educator to come and speak with the patient -Follow CBGs and provide SSI    Essential hypertension -At presentation while patient was anxious blood pressure not well controlled -Was not on medications prior to admission but will continue to monitor    Tobacco abuse -Patient has been unsuccessful in the past in stopping smoking -Counseled patient to at least decrease from 2 to one pack per day with eventual goal to stop completely -Nicotine patch -prn Ativan for anxiety       DVT prophylaxis: Heparin infusion  Code Status: Full code  Family Communication: Wife at bedside  Disposition Plan: Anticipate discharge to previous home environment when medically stable Consults called: [facility] aware of stress test but formal consult not indicated unless stress test positive  Admission status: Observation/telemetry    ELLIS,ALLISON L. ANP-BC [facility] Pager [phone]   If 7PM-7AM, please contact night-coverage [email or web address] Password [facility]  05/09/2016, 4:37 PM

## 2016-05-09 NOTE — ED Notes (Signed)
Echo at bedside

## 2016-05-09 NOTE — Telephone Encounter (Signed)
Patient's wife called about husband who was admitted last night with chest pain and left hospital AMA. Dr Lilyan PuntScott Luking spoke with wife.

## 2016-05-09 NOTE — ED Notes (Signed)
Checked CBG 186, RN JESSICA INFORMED

## 2016-05-09 NOTE — Progress Notes (Signed)
ANTICOAGULATION CONSULT NOTE - Initial Consult  Pharmacy Consult for heparin Indication: chest pain/ACS  No Known Allergies  Patient Measurements: Height: 5' 8.9" (175 cm) IBW/kg (Calculated) : 70.47 Heparin Dosing Weight: 97  Vital Signs: BP: 164/98 mmHg (06/08 1455) Pulse Rate: 82 (06/08 1455)  Labs:  Recent Labs  05/08/16 0931 05/08/16 1448 05/08/16 2056 05/09/16 0302 05/09/16 1327  HGB 16.7  --   --   --  16.3  HCT 45.5  --   --   --  46.2  PLT 154  --   --   --  153  LABPROT 13.1  --   --   --   --   INR 0.97  --   --   --   --   CREATININE 0.87  --   --   --  1.00  TROPONINI 0.04* 0.18* 0.16* 0.17*  --     Estimated Creatinine Clearance: 106.1 mL/min (by C-G formula based on Cr of 1).   Medical History: Past Medical History  Diagnosis Date  . Diabetes mellitus without complication (HCC)     diet controlled  . Hypertension     h/o of HTN, on no meds now.    Medications:  Scheduled:  . aspirin  324 mg Oral Once  . heparin  4,000 Units Intravenous Once  . LORazepam  1 mg Intravenous Once    Assessment: 45 yo male with chest pain and elevated troponin. Pharmacy consulted to dose heparin. Not on anticoagulation PTA and CBC wnl.  Goal of Therapy:  Heparin level 0.3-0.7 units/ml Monitor platelets by anticoagulation protocol: Yes   Plan:  Give 4000 units bolus x 1 Start heparin infusion at 1200 units/hr Check anti-Xa level in 6 hours and daily while on heparin Continue to monitor H&H and platelets  Sherron MondayAubrey N. Mako Pelfrey, PharmD Clinical Pharmacy Resident Pager: [phone] 05/09/2016 3:15 PM

## 2016-05-09 NOTE — ED Notes (Signed)
Report attempted x2. Nurse to call back.  

## 2016-05-09 NOTE — Progress Notes (Signed)
ANTICOAGULATION CONSULT NOTE - Follow Up Consult  Pharmacy Consult for Heparin Indication: chest pain/ACS  No Known Allergies  Patient Measurements: Height: 5\' 9"  (175.3 cm) Weight: 201 lb 4.8 oz (91.309 kg) (b scale) IBW/kg (Calculated) : 70.7 Heparin Dosing Weight:  97 kg  Vital Signs: Temp: 97.7 F (36.5 C) (06/08 2011) Temp Source: Oral (06/08 2011) BP: 155/91 mmHg (06/08 2011) Pulse Rate: 77 (06/08 2011)  Labs:  Recent Labs  05/08/16 0931  05/08/16 2056 05/09/16 0302 05/09/16 1327 05/09/16 1533 05/09/16 2035  HGB 16.7  --   --   --  16.3  --   --   HCT 45.5  --   --   --  46.2  --   --   PLT 154  --   --   --  153  --   --   LABPROT 13.1  --   --   --   --   --   --   INR 0.97  --   --   --   --   --   --   HEPARINUNFRC  --   --   --   --   --   --  0.19*  CREATININE 0.87  --   --   --  1.00  --   --   TROPONINI 0.04*  < > 0.16* 0.17*  --  0.10*  --   < > = values in this interval not displayed.  Estimated Creatinine Clearance: 103 mL/min (by C-G formula based on Cr of 1).  Assessment: 45 yo male with chest pain and elevated troponin.  First heparin level HL 0.19 less than goal.   Goal of Therapy:  Heparin level 0.3-0.7 units/ml Monitor platelets by anticoagulation protocol: Yes   Plan:  Increase IV heparin to 1400 units/hr Daily HL and CBC   Brilyn Tuller S. Merilynn Finlandobertson, PharmD, BCPS Clinical Staff Pharmacist Pager [phone]  Misty Stanleyobertson, Welden Hausmann Stillinger 05/09/2016,9:37 PM

## 2016-05-10 ENCOUNTER — Other Ambulatory Visit: Payer: Self-pay | Admitting: *Deleted

## 2016-05-10 ENCOUNTER — Observation Stay (HOSPITAL_COMMUNITY): Payer: BLUE CROSS/BLUE SHIELD

## 2016-05-10 ENCOUNTER — Encounter (HOSPITAL_COMMUNITY)
Admission: EM | Disposition: A | Discharge status: Home / Self Care | Payer: Self-pay | Source: Home / Self Care | Attending: Cardiothoracic Surgery

## 2016-05-10 DIAGNOSIS — I119 Hypertensive heart disease without heart failure: Secondary | ICD-10-CM | POA: Diagnosis not present

## 2016-05-10 DIAGNOSIS — R918 Other nonspecific abnormal finding of lung field: Secondary | ICD-10-CM | POA: Diagnosis not present

## 2016-05-10 DIAGNOSIS — I10 Essential (primary) hypertension: Secondary | ICD-10-CM | POA: Diagnosis not present

## 2016-05-10 DIAGNOSIS — I2510 Atherosclerotic heart disease of native coronary artery without angina pectoris: Secondary | ICD-10-CM

## 2016-05-10 DIAGNOSIS — I25110 Atherosclerotic heart disease of native coronary artery with unstable angina pectoris: Secondary | ICD-10-CM | POA: Diagnosis not present

## 2016-05-10 DIAGNOSIS — Z720 Tobacco use: Secondary | ICD-10-CM | POA: Diagnosis not present

## 2016-05-10 DIAGNOSIS — Z8249 Family history of ischemic heart disease and other diseases of the circulatory system: Secondary | ICD-10-CM | POA: Diagnosis not present

## 2016-05-10 DIAGNOSIS — Z79899 Other long term (current) drug therapy: Secondary | ICD-10-CM | POA: Diagnosis not present

## 2016-05-10 DIAGNOSIS — F17210 Nicotine dependence, cigarettes, uncomplicated: Secondary | ICD-10-CM | POA: Diagnosis present

## 2016-05-10 DIAGNOSIS — E1100 Type 2 diabetes mellitus with hyperosmolarity without nonketotic hyperglycemic-hyperosmolar coma (NKHHC): Secondary | ICD-10-CM | POA: Diagnosis not present

## 2016-05-10 DIAGNOSIS — I252 Old myocardial infarction: Secondary | ICD-10-CM | POA: Diagnosis not present

## 2016-05-10 DIAGNOSIS — I214 Non-ST elevation (NSTEMI) myocardial infarction: Secondary | ICD-10-CM | POA: Insufficient documentation

## 2016-05-10 DIAGNOSIS — E8881 Metabolic syndrome: Secondary | ICD-10-CM | POA: Diagnosis not present

## 2016-05-10 DIAGNOSIS — Z4682 Encounter for fitting and adjustment of non-vascular catheter: Secondary | ICD-10-CM | POA: Diagnosis not present

## 2016-05-10 DIAGNOSIS — I4891 Unspecified atrial fibrillation: Secondary | ICD-10-CM | POA: Diagnosis present

## 2016-05-10 DIAGNOSIS — I080 Rheumatic disorders of both mitral and aortic valves: Secondary | ICD-10-CM | POA: Diagnosis not present

## 2016-05-10 DIAGNOSIS — Z951 Presence of aortocoronary bypass graft: Secondary | ICD-10-CM | POA: Diagnosis not present

## 2016-05-10 DIAGNOSIS — E785 Hyperlipidemia, unspecified: Secondary | ICD-10-CM

## 2016-05-10 DIAGNOSIS — J9811 Atelectasis: Secondary | ICD-10-CM | POA: Diagnosis not present

## 2016-05-10 DIAGNOSIS — I209 Angina pectoris, unspecified: Secondary | ICD-10-CM | POA: Diagnosis not present

## 2016-05-10 DIAGNOSIS — E1165 Type 2 diabetes mellitus with hyperglycemia: Secondary | ICD-10-CM | POA: Diagnosis not present

## 2016-05-10 DIAGNOSIS — R079 Chest pain, unspecified: Secondary | ICD-10-CM

## 2016-05-10 DIAGNOSIS — I6522 Occlusion and stenosis of left carotid artery: Secondary | ICD-10-CM | POA: Diagnosis present

## 2016-05-10 DIAGNOSIS — F419 Anxiety disorder, unspecified: Secondary | ICD-10-CM | POA: Diagnosis present

## 2016-05-10 HISTORY — PX: CARDIAC CATHETERIZATION: SHX172

## 2016-05-10 LAB — CBC
HEMATOCRIT: 42.4 % (ref 39.0–52.0)
Hemoglobin: 14.7 g/dL (ref 13.0–17.0)
MCH: 30.2 pg (ref 26.0–34.0)
MCHC: 34.7 g/dL (ref 30.0–36.0)
MCV: 87.2 fL (ref 78.0–100.0)
PLATELETS: 138 10*3/uL — AB (ref 150–400)
RBC: 4.86 MIL/uL (ref 4.22–5.81)
RDW: 12 % (ref 11.5–15.5)
WBC: 7.7 10*3/uL (ref 4.0–10.5)

## 2016-05-10 LAB — LIPID PANEL
CHOLESTEROL: 239 mg/dL — AB (ref 0–200)
HDL: 26 mg/dL — ABNORMAL LOW (ref >40)
LDL CALC: UNDETERMINED mg/dL (ref 0–99)
TRIGLYCERIDES: 427 mg/dL — AB (ref <150)
Total CHOL/HDL Ratio: 9.2 RATIO
VLDL: UNDETERMINED mg/dL (ref 0–40)

## 2016-05-10 LAB — TROPONIN I
Troponin I: 0.13 ng/mL — ABNORMAL HIGH (ref <0.031)
Troponin I: 0.07 ng/mL — ABNORMAL HIGH (ref <0.031)

## 2016-05-10 LAB — GLUCOSE, CAPILLARY
GLUCOSE-CAPILLARY: 231 mg/dL — AB (ref 65–99)
GLUCOSE-CAPILLARY: 99 mg/dL (ref 65–99)
GLUCOSE-CAPILLARY: 169 mg/dL — AB (ref 65–99)
Glucose-Capillary: 161 mg/dL — ABNORMAL HIGH (ref 65–99)

## 2016-05-10 LAB — NM MYOCAR MULTI W/SPECT W/WALL MOTION / EF
CHL CUP NUCLEAR SDS: 4
CHL CUP NUCLEAR SRS: 6
CHL CUP NUCLEAR SSS: 10
LHR: 0.3
LV dias vol: 102 mL (ref 62–150)
LV sys vol: 43 mL
Peak HR: 116 {beats}/min
Rest HR: 78 {beats}/min
TID: 1.17

## 2016-05-10 LAB — HEMOGLOBIN A1C
HEMOGLOBIN A1C: 10.2 % — AB (ref 4.8–5.6)
MEAN PLASMA GLUCOSE: 246 mg/dL

## 2016-05-10 LAB — MRSA PCR SCREENING: MRSA by PCR: NEGATIVE

## 2016-05-10 LAB — HEPARIN LEVEL (UNFRACTIONATED)
HEPARIN UNFRACTIONATED: 0.16 [IU]/mL — AB (ref 0.30–0.70)
HEPARIN UNFRACTIONATED: 0.19 [IU]/mL — AB (ref 0.30–0.70)

## 2016-05-10 SURGERY — LEFT HEART CATH AND CORONARY ANGIOGRAPHY

## 2016-05-10 MED ORDER — MIDAZOLAM HCL 2 MG/2ML IJ SOLN
INTRAMUSCULAR | Status: DC | PRN
  Administered 2016-05-10: 2 mg via INTRAVENOUS

## 2016-05-10 MED ORDER — SODIUM CHLORIDE 0.9 % WEIGHT BASED INFUSION
1.0000 mL/kg/h | INTRAVENOUS | Status: DC
Start: 2016-05-10 — End: 2016-05-10

## 2016-05-10 MED ORDER — INSULIN STARTER KIT- PEN NEEDLES (ENGLISH)
1.0000 | Freq: Once | Status: DC
  Filled 2016-05-10: qty 1

## 2016-05-10 MED ORDER — SODIUM CHLORIDE 0.9% FLUSH
3.0000 mL | Freq: Two times a day (BID) | INTRAVENOUS | Status: DC
Start: 2016-05-10 — End: 2016-05-10
  Administered 2016-05-10: 3 mL via INTRAVENOUS

## 2016-05-10 MED ORDER — ASPIRIN 81 MG PO CHEW
81.0000 mg | CHEWABLE_TABLET | Freq: Every day | ORAL | Status: DC
  Administered 2016-05-11 – 2016-05-12 (×2): 81 mg via ORAL
  Filled 2016-05-10 (×3): qty 1

## 2016-05-10 MED ORDER — TECHNETIUM TC 99M TETROFOSMIN IV KIT
10.0000 | PACK | Freq: Once | INTRAVENOUS | Status: AC | PRN
  Administered 2016-05-10: 10 via INTRAVENOUS

## 2016-05-10 MED ORDER — SODIUM CHLORIDE 0.9 % WEIGHT BASED INFUSION: 1.0000 mL/kg/h | INTRAVENOUS | Status: AC

## 2016-05-10 MED ORDER — FENTANYL CITRATE (PF) 100 MCG/2ML IJ SOLN
INTRAMUSCULAR | Status: AC
  Filled 2016-05-10: qty 2

## 2016-05-10 MED ORDER — HEPARIN SODIUM (PORCINE) 1000 UNIT/ML IJ SOLN
INTRAMUSCULAR | Status: AC
  Filled 2016-05-10: qty 1

## 2016-05-10 MED ORDER — INSULIN GLARGINE 100 UNIT/ML ~~LOC~~ SOLN
14.0000 [IU] | Freq: Every day | SUBCUTANEOUS | Status: DC
  Administered 2016-05-10: 14 [IU] via SUBCUTANEOUS
  Filled 2016-05-10 (×2): qty 0.14

## 2016-05-10 MED ORDER — ACETAMINOPHEN 325 MG PO TABS: 650.0000 mg | ORAL_TABLET | ORAL | Status: DC | PRN

## 2016-05-10 MED ORDER — FENTANYL CITRATE (PF) 100 MCG/2ML IJ SOLN
INTRAMUSCULAR | Status: DC | PRN
  Administered 2016-05-10: 50 ug via INTRAVENOUS

## 2016-05-10 MED ORDER — SODIUM CHLORIDE 0.9 % IV SOLN: 250.0000 mL | INTRAVENOUS | Status: DC | PRN

## 2016-05-10 MED ORDER — LISINOPRIL 10 MG PO TABS
10.0000 mg | ORAL_TABLET | Freq: Every day | ORAL | Status: DC
  Administered 2016-05-10 – 2016-05-12 (×3): 10 mg via ORAL
  Filled 2016-05-10 (×3): qty 1

## 2016-05-10 MED ORDER — SODIUM CHLORIDE 0.9 % WEIGHT BASED INFUSION
3.0000 mL/kg/h | INTRAVENOUS | Status: DC
  Administered 2016-05-10: 3 mL/kg/h via INTRAVENOUS

## 2016-05-10 MED ORDER — LIVING WELL WITH DIABETES BOOK
Freq: Once | Status: AC
  Administered 2016-05-10: 13:00:00
  Filled 2016-05-10: qty 1

## 2016-05-10 MED ORDER — MIDAZOLAM HCL 2 MG/2ML IJ SOLN
INTRAMUSCULAR | Status: AC
  Filled 2016-05-10: qty 2

## 2016-05-10 MED ORDER — VERAPAMIL HCL 2.5 MG/ML IV SOLN
INTRAVENOUS | Status: DC | PRN
  Administered 2016-05-10: 10 mL via INTRA_ARTERIAL

## 2016-05-10 MED ORDER — LIDOCAINE HCL (PF) 1 % IJ SOLN
INTRAMUSCULAR | Status: AC
  Filled 2016-05-10: qty 30

## 2016-05-10 MED ORDER — HEPARIN (PORCINE) IN NACL 2-0.9 UNIT/ML-% IJ SOLN
INTRAMUSCULAR | Status: DC | PRN
  Administered 2016-05-10: 1000 mL

## 2016-05-10 MED ORDER — CARVEDILOL 12.5 MG PO TABS
12.5000 mg | ORAL_TABLET | Freq: Two times a day (BID) | ORAL | Status: DC
  Administered 2016-05-11 – 2016-05-12 (×4): 12.5 mg via ORAL
  Filled 2016-05-10 (×4): qty 1

## 2016-05-10 MED ORDER — SODIUM CHLORIDE 0.9% FLUSH: 3.0000 mL | INTRAVENOUS | Status: DC | PRN

## 2016-05-10 MED ORDER — IOPAMIDOL (ISOVUE-370) INJECTION 76%
INTRAVENOUS | Status: AC
  Filled 2016-05-10: qty 100

## 2016-05-10 MED ORDER — REGADENOSON 0.4 MG/5ML IV SOLN
0.4000 mg | Freq: Once | INTRAVENOUS | Status: DC
  Filled 2016-05-10: qty 5

## 2016-05-10 MED ORDER — VERAPAMIL HCL 2.5 MG/ML IV SOLN
INTRAVENOUS | Status: AC
  Filled 2016-05-10: qty 2

## 2016-05-10 MED ORDER — TECHNETIUM TC 99M TETROFOSMIN IV KIT
30.0000 | PACK | Freq: Once | INTRAVENOUS | Status: AC | PRN
  Administered 2016-05-10: 30 via INTRAVENOUS

## 2016-05-10 MED ORDER — ATORVASTATIN CALCIUM 80 MG PO TABS
80.0000 mg | ORAL_TABLET | Freq: Every day | ORAL | Status: DC
  Administered 2016-05-10 – 2016-05-16 (×7): 80 mg via ORAL
  Filled 2016-05-10 (×8): qty 1

## 2016-05-10 MED ORDER — ASPIRIN 81 MG PO CHEW: 81.0000 mg | CHEWABLE_TABLET | ORAL | Status: DC

## 2016-05-10 MED ORDER — IOPAMIDOL (ISOVUE-370) INJECTION 76%
INTRAVENOUS | Status: DC | PRN
  Administered 2016-05-10: 70 mL via INTRA_ARTERIAL

## 2016-05-10 MED ORDER — ONDANSETRON HCL 4 MG/2ML IJ SOLN: 4.0000 mg | Freq: Four times a day (QID) | INTRAMUSCULAR | Status: DC | PRN

## 2016-05-10 MED ORDER — INSULIN GLARGINE 100 UNIT/ML ~~LOC~~ SOLN: 14.0000 [IU] | Freq: Two times a day (BID) | SUBCUTANEOUS | Status: DC

## 2016-05-10 MED ORDER — REGADENOSON 0.4 MG/5ML IV SOLN
INTRAVENOUS | Status: AC
  Administered 2016-05-10: 0.4 mg
  Filled 2016-05-10: qty 5

## 2016-05-10 MED ORDER — HEPARIN (PORCINE) IN NACL 100-0.45 UNIT/ML-% IJ SOLN
1500.0000 [IU]/h | INTRAMUSCULAR | Status: DC
  Administered 2016-05-10 – 2016-05-12 (×3): 1700 [IU]/h via INTRAVENOUS
  Administered 2016-05-12: 1500 [IU]/h via INTRAVENOUS
  Filled 2016-05-10 (×4): qty 250

## 2016-05-10 MED ORDER — HEPARIN SODIUM (PORCINE) 1000 UNIT/ML IJ SOLN
INTRAMUSCULAR | Status: DC | PRN
  Administered 2016-05-10: 4500 [IU] via INTRAVENOUS

## 2016-05-10 MED ORDER — SODIUM CHLORIDE 0.9% FLUSH
3.0000 mL | Freq: Two times a day (BID) | INTRAVENOUS | Status: DC
  Administered 2016-05-10: 3 mL via INTRAVENOUS

## 2016-05-10 MED ORDER — HEPARIN (PORCINE) IN NACL 2-0.9 UNIT/ML-% IJ SOLN
INTRAMUSCULAR | Status: AC
  Filled 2016-05-10: qty 1000

## 2016-05-10 MED ORDER — LIDOCAINE HCL (PF) 1 % IJ SOLN
INTRAMUSCULAR | Status: DC | PRN
  Administered 2016-05-10: 2 mL via SUBCUTANEOUS

## 2016-05-10 MED ORDER — ASPIRIN 81 MG PO CHEW: 81.0000 mg | CHEWABLE_TABLET | Freq: Every day | ORAL | Status: DC

## 2016-05-10 SURGICAL SUPPLY — 11 items

## 2016-05-10 NOTE — Progress Notes (Signed)
ANTICOAGULATION CONSULT NOTE - Follow Up Consult  Pharmacy Consult for Heparin  Indication: chest pain/ACS  No Known Allergies  Patient Measurements: Height: 5\' 9"  (175.3 cm) Weight: 201 lb 12.8 oz (91.536 kg) (b scale) IBW/kg (Calculated) : 70.7  Vital Signs: Temp: 97.5 F (36.4 C) (06/09 0755) Temp Source: Oral (06/09 0755) BP: 181/88 mmHg (06/09 0928) Pulse Rate: 102 (06/09 0928)  Labs:  Recent Labs  05/08/16 0931  05/09/16 1327  05/09/16 2035 05/09/16 2121 05/10/16 0305 05/10/16 1105  HGB 16.7  --  16.3  --   --   --  14.7  --   HCT 45.5  --  46.2  --   --   --  42.4  --   PLT 154  --  153  --   --   --  138*  --   LABPROT 13.1  --   --   --   --   --   --   --   INR 0.97  --   --   --   --   --   --   --   HEPARINUNFRC  --   --   --   --  0.19*  --  0.16* 0.19*  CREATININE 0.87  --  1.00  --   --   --   --   --   TROPONINI 0.04*  < >  --   < >  --  0.09* 0.13* 0.07*  < > = values in this interval not displayed.  Estimated Creatinine Clearance: 103.1 mL/min (by C-G formula based on Cr of 1).   Assessment: 89 YOM who presented to [facility] on 6/7 with CP and left AMA and [facility].   The heparin drip was turned off from 0845-0930 this AM for the myoview and the patient's heparin level was drawn at 1100 and was SUBtherapeutic at 0.19. Will not plan to make any adjustments at this time since the drip was turned off and will recheck another heparin level this afternoon.   Goal of Therapy:  Heparin level 0.3-0.7 units/ml Monitor platelets by anticoagulation protocol: Yes   Plan:  1. Continue heparin at 1700 units/hr (17 ml/hr) 2. Will continue to monitor for any signs/symptoms of bleeding and will follow up with heparin level in 6 hours   Georgina PillionElizabeth Kashmir Lysaght, PharmD, BCPS Clinical Pharmacist Pager: [phone] 05/10/2016 1:16 PM

## 2016-05-10 NOTE — Progress Notes (Signed)
3 min, Katie PA at bedside, pt continues to deny and CP/SOB or other associated symptoms.

## 2016-05-10 NOTE — Progress Notes (Signed)
5 min, Katie PA at bedside, pt continues to deny any CP/SOB or other associated symptoms.  Procedure ended

## 2016-05-10 NOTE — H&P (View-Only) (Signed)
CARDIOLOGY CONSULT NOTE   Patient ID: [name] MRN: [id number] DOB/AGE: 05/14/1971 45 y.o.  Admit date: 05/09/2016  Requesting Physician: Dr. Wendee Beavers Primary Physician:   Mickie Hillier, MD Primary Cardiologist:  New Reason for Consultation: abnormal nuc  HPI: [name] is a 45 y.o. male with a history of uncontrolled DM, HTN, HLD, tobacco use and family history of CAD who presented to [facility] on 05/09/16 with chest pain. IM discussed with cardiology who agreed with nuclear study. This was performed today and returned intermediate risk with possible ischemia vs artifact and cardiology consulted.   He initially presented to the [facility] on 6/7 after developing chest pain while at work. He was walking at work when he developed heaviness in the center of his chest that did not radiate; he described this as "an anvil on my chest". He states this pain was associated with shortness of breath and diaphoresis. He sat down and the pain did improve. He subsequently was transported via EMS to the [facility] and apparently received nitroglycerin in route and by the time he arrived his chest pain had resolved-this occurred over about 1 hour. On his initial EKG he had nonspecific ST segment changes in the inferior leads consisting of flattening out of the ST segments in leads 2 and aVF with somewhat of a downsloping of the ST segment in lead 3. Patient left AGAINST MEDICAL ADVICE  before a physician could speak with him.   He then represented to [facility] on 05/09/16 for further evaluation. His troponin was noted to be mildly elevated but with flat trend: 0.09--> 0.13--> 0.07. 2D ECHO showed normal LV function with no RWMAs. Cardiology was called but not consulted and agreed with nuclear stress testing. This was done today with a "medium defect of mild severity present in the basal inferior, basal inferolateral, mid inferior and mid inferolateral location." The study had signigicant artifact but ischemia  could not be ruled out.  The patient has remained chest pain free since admission. No LE edema, orthopnea or PND. NO dizziness or syncope. He says his father who was not a smoker had his first MI in his 29s and that every one on his dad's side has had strokes and heart attacks.   Of note, his HgA1c 10.2 and TC 239; TG 427; HDL 26; LDL unable to be calculated. He has not seen a PCP in a while and "only sees them if he has to." He has not taken any medications for DM, HTN or HLD since that time.   Past Medical History  Diagnosis Date  . Diabetes mellitus without complication (HCC)     diet controlled  . Hypertension     h/o of HTN, on no meds now.     Past Surgical History  Procedure Laterality Date  . Appendectomy    . Knee arthroscopy Left   . Ganglion cyst excision Left 10/12/2015    Procedure: EXCISION MUCOID TUMOR LEFT MIDDLE FINGER ;  Surgeon: Daryll Brod, MD;  Location: [facility];  Service: Orthopedics;  Laterality: Left;  Left middle finger.    No Known Allergies  I have reviewed the patient's current medications . aspirin EC  325 mg Oral Daily  . insulin aspart  0-5 Units Subcutaneous QHS  . insulin aspart  0-9 Units Subcutaneous TID WC  . insulin starter kit- pen needles  1 kit Other Once  . living well with diabetes book   Does  not apply Once  . metoprolol tartrate  25 mg Oral BID  . nicotine  21 mg Transdermal Daily  . regadenoson  0.4 mg Intravenous Once   . heparin 1,700 Units/hr (05/10/16 0931)   acetaminophen, gi cocktail, LORazepam, morphine injection, ondansetron (ZOFRAN) IV  Prior to Admission medications   Medication Sig Start Date End Date Taking? Authorizing Provider  glucosamine-chondroitin 500-400 MG tablet Take 1 tablet by mouth daily.   Yes Historical Provider, MD  naproxen sodium (ANAPROX) 220 MG tablet Take 440 mg by mouth daily as needed (headache/pain).   Yes Historical Provider, MD     Social History   Social History  .  Marital Status: Married    Spouse Name: N/A  . Number of Children: N/A  . Years of Education: N/A   Occupational History  . Not on file.   Social History Main Topics  . Smoking status: Current Every Day Smoker -- 2.00 packs/day  . Smokeless tobacco: Not on file  . Alcohol Use: No  . Drug Use: Yes    Special: Marijuana     Comment: occasionally marjuana  . Sexual Activity: Not on file   Other Topics Concern  . Not on file   Social History Narrative    No family status information on file.   Family History  Problem Relation Age of Onset  . Diabetes Brother      ROS:  Full 14 point review of systems complete and found to be negative unless listed above.  Physical Exam: Blood pressure 181/88, pulse 102, temperature 97.5 F (36.4 C), temperature source Oral, resp. rate 18, height _0  (1.753 m), weight 201 lb 12.8 oz (91.536 kg), SpO2 99 %.  General: Well developed, well nourished, male in no acute distress Head: Eyes PERRLA, No xanthomas.   Normocephalic and atraumatic, oropharynx without edema or exudate.  Lungs: CTAB Heart: HRRR S1 S2, no rub/gallop, Heart irregular rate and rhythm with S1, S2  murmur. pulses are 2+ extrem.   Neck: No carotid bruits. No lymphadenopathy. No JVD. Abdomen: Bowel sounds present, abdomen soft and non-tender without masses or hernias noted. Msk:  No spine or cva tenderness. No weakness, no joint deformities or effusions. Extremities: No clubbing or cyanosis.  No LE edema.  Neuro: Alert and oriented X 3. No focal deficits noted. Psych:  Good affect, responds appropriately Skin: No rashes or lesions noted.  Labs:   Lab Results  Component Value Date   WBC 7.7 05/10/2016   HGB 14.7 05/10/2016   HCT 42.4 05/10/2016   MCV 87.2 05/10/2016   PLT 138* 05/10/2016    Recent Labs  05/08/16 0931  INR 0.97    Recent Labs Lab 05/09/16 1327  NA 135  K 4.2  CL 101  CO2 25  BUN 11  CREATININE 1.00  CALCIUM 9.2  GLUCOSE 295*    MAGNESIUM  Date Value Ref Range Status  05/08/2016 1.8 1.7 - 2.4 mg/dL Final    Recent Labs  05/09/16 0302 05/09/16 1533 05/09/16 2121 05/10/16 0305  TROPONINI 0.17* 0.10* 0.09* 0.13*    Recent Labs  05/09/16 1339  TROPIPOC 0.10*   No results found for: PROBNP Lab Results  Component Value Date   CHOL 239* 05/10/2016   HDL 26* 05/10/2016   LDLCALC UNABLE TO CALCULATE IF TRIGLYCERIDE OVER 400 mg/dL 05/10/2016   TRIG 427* 05/10/2016   Echo: 05/09/2016 LV EF: 55% - 60% Study Conclusions - Left ventricle: The cavity size was normal. There was mild  concentric hypertrophy. Systolic function was normal. The  estimated ejection fraction was in the range of 55% to 60%. Wall  motion was normal; there were no regional wall motion  abnormalities. The transmitral flow pattern was normal. Left  ventricular diastolic function parameters were normal. - Aortic valve: Trileaflet; normal thickness, mildly calcified  leaflets. - Mitral valve: There was trivial regurgitation.  ECG:  NSR HR 93  Radiology:     05/10/16 Study Result      Defect 1: There is a medium defect of mild severity present in the basal inferior, basal inferolateral, mid inferior and mid inferolateral location.  Findings consistent with ischemia.  This is an intermediate risk study.  The left ventricular ejection fraction is normal (55-65%).  There was no ST segment deviation noted during stress.  This study is significantly affected by artifacts. However, there appears to be medium size, mild severity reversible defect in the basal and mid inferior and inferolateral walls (SDS =4). LVEF is normal.      ASSESSMENT AND PLAN:    Principal Problem:   Chest pain Active Problems:   Diabetes mellitus type II, uncontrolled (HCC)   Essential hypertension   Tobacco abuse  Chest pain / intermediate risk stress test: troponin was noted to be mildly elevated but with flat trend: 0.09--> 0.13-->  0.07. Stress test today with significant artifact but possible medium size, mild severity reversible defect in the basal and mid inferior and inferolateral walls. I discussed with Dr. Oval Linsey and we have decided to proceed coronary angiography for definitive evaluation of coronary anatomy. Continue heparin gtt for now.  The patient understands that risks include but are not limited to stroke (1 in 1000), death (1 in 25), kidney failure [usually temporary] (1 in 500), bleeding (1 in 200), allergic reaction [possibly serious] (1 in 200), and agrees to proceed.   DMT2: he has not taken any medications for this in quite some time. His HgA1c is >10. Would restart diabetes regimen per IM  HTN: BP has been significantly elevated since admission. He was started on Lopressor 70m BID but BPs still quite hight. Will add Lisinopril 10 mg daily in the setting of DMT2.  HLD: will start atorvastatin    Tobacco abuse: counseled on cessation   Signed: KAngelena Form PA-C 05/10/2016 12:34 PM  Pager [phone] Co-Sign MD

## 2016-05-10 NOTE — Progress Notes (Signed)
PROGRESS NOTE    [name]  VPX:[id number] DOB: May 09, 1971 DOA: 05/09/2016 PCP: Mickie Hillier, MD    Brief Narrative:  45 y.o. male with a Past Medical History of DM, HTN, Tobacco use who presents with CP. Very concerning for cardiac etiology.   Assessment & Plan:   Principal Problem:   Chest pain/ NSTEMI (non-ST elevated myocardial infarction) [facility]) - Cardiology on board and managing.  Active Problems:   Diabetes mellitus type II, uncontrolled ([place]) - SSI - diabetic diet    Essential hypertension - on ACEI and B blocker    Tobacco abuse - nicotine patch in place     DVT prophylaxis: heparin gtt Code Status: full Family Communication: d/c pt directly Disposition Plan: Pending recommended workup by cardiology   Consultants:   Cardiology   Procedures: Please refer to EMR and cardiology notes   Antimicrobials: None   Subjective: Patient has no new complaints. He is looking forward to going home after cardiology workup  Objective: Filed Vitals:   05/10/16 1715 05/10/16 1730 05/10/16 1745 05/10/16 1800  BP: 132/85 139/55 134/87 119/80  Pulse: 73 78 88 81  Temp:      TempSrc:      Resp: _0 Height:      Weight:      SpO2: 100% 100% 99% 97%    Intake/Output Summary (Last 24 hours) at 05/10/16 1828 Last data filed at 05/10/16 1700  Gross per 24 hour  Intake 577.86 ml  Output   1425 ml  Net -847.14 ml   Filed Weights   05/09/16 2011 05/10/16 0634 05/10/16 1634  Weight: 91.309 kg (201 lb 4.8 oz) 91.536 kg (201 lb 12.8 oz) 91.5 kg (201 lb 11.5 oz)    Examination:  General exam: Appears calm and comfortable  Respiratory system: Clear to auscultation. Respiratory effort normal. Cardiovascular system: No cyanosis Gastrointestinal system: Abdomen is nondistended, soft and nontender.  Central nervous system: Alert and oriented. No focal neurological deficits. Extremities: Symmetric 5 x 5 power. Skin: No rashes, lesions or  ulcers Psychiatry: Judgement and insight appear normal. Mood & affect appropriate.     Data Reviewed: I have personally reviewed following labs and imaging studies  CBC:  Recent Labs Lab 05/08/16 0931 05/09/16 1327 05/10/16 0305  WBC 8.5 8.1 7.7  HGB 16.7 16.3 14.7  HCT 45.5 46.2 42.4  MCV 87.8 86.7 87.2  PLT 154 153 462*   Basic Metabolic Panel:  Recent Labs Lab 05/08/16 0931 05/09/16 1327  NA 136 135  K 3.5 4.2  CL 102 101  CO2 27 25  GLUCOSE 318* 295*  BUN 14 11  CREATININE 0.87 1.00  CALCIUM 9.1 9.2  MG 1.8  --    GFR: Estimated Creatinine Clearance: 103.1 mL/min (by C-G formula based on Cr of 1). Liver Function Tests: No results for input(s): AST, ALT, ALKPHOS, BILITOT, PROT, ALBUMIN in the last 168 hours. No results for input(s): LIPASE, AMYLASE in the last 168 hours. No results for input(s): AMMONIA in the last 168 hours. Coagulation Profile:  Recent Labs Lab 05/08/16 0931  INR 0.97   Cardiac Enzymes:  Recent Labs Lab 05/09/16 0302 05/09/16 1533 05/09/16 2121 05/10/16 0305 05/10/16 1105  TROPONINI 0.17* 0.10* 0.09* 0.13* 0.07*   BNP (last 3 results) No results for input(s): PROBNP in the last 8760 hours. HbA1C:  Recent Labs  05/09/16 1327  HGBA1C 10.2*   CBG:  Recent Labs Lab 05/09/16 1825 05/09/16 2039 05/10/16 0555 05/10/16 1321  05/10/16 1718  GLUCAP 186* 188* 231* 161* 99   Lipid Profile:  Recent Labs  05/10/16 0305  CHOL 239*  HDL 26*  LDLCALC UNABLE TO CALCULATE IF TRIGLYCERIDE OVER 400 mg/dL  TRIG 427*  CHOLHDL 9.2   Thyroid Function Tests: No results for input(s): TSH, T4TOTAL, FREET4, T3FREE, THYROIDAB in the last 72 hours. Anemia Panel: No results for input(s): VITAMINB12, FOLATE, FERRITIN, TIBC, IRON, RETICCTPCT in the last 72 hours. Sepsis Labs: No results for input(s): PROCALCITON, LATICACIDVEN in the last 168 hours.  No results found for this or any previous visit (from the past 240 hour(s)).        Radiology Studies: Nm Myocar Multi W/spect W/wall Motion / Ef  05/10/2016   Defect 1: There is a medium defect of mild severity present in the basal inferior, basal inferolateral, mid inferior and mid inferolateral location.  Findings consistent with ischemia.  This is an intermediate risk study.  The left ventricular ejection fraction is normal (55-65%).  There was no ST segment deviation noted during stress.  This study is significantly affected by artifacts. However, there appears to be medium size, mild severity reversible defect in the basal and mid inferior and inferolateral walls (SDS =4). LVEF is normal.        Scheduled Meds: . [START ON 05/11/2016] aspirin  81 mg Oral Daily  . atorvastatin  80 mg Oral q1800  . carvedilol  12.5 mg Oral BID WC  . insulin aspart  0-5 Units Subcutaneous QHS  . insulin aspart  0-9 Units Subcutaneous TID WC  . insulin starter kit- pen needles  1 kit Other Once  . lisinopril  10 mg Oral Daily  . nicotine  21 mg Transdermal Daily  . regadenoson  0.4 mg Intravenous Once  . sodium chloride flush  3 mL Intravenous Q12H   Continuous Infusions: . sodium chloride 1 mL/kg/hr (05/10/16 1645)  . heparin       LOS: 0 days    Time spent: > 20 minutes    Velvet Bathe, MD [facility] Pager [phone]  If 7PM-7AM, please contact night-coverage [email or web address] Password [facility] 05/10/2016, 6:28 PM

## 2016-05-10 NOTE — Progress Notes (Addendum)
Inpatient Diabetes Program Recommendations  AACE/ADA: New Consensus Statement on Inpatient Glycemic Control (2015)  Target Ranges:  Prepandial:   less than 140 mg/dL      Peak postprandial:   less than 180 mg/dL (1-2 hours)      Critically ill patients:  140 - 180 mg/dL   Lab Results  Component Value Date   GLUCAP 231* 05/10/2016   HGBA1C 10.2* 05/09/2016   Review of Glycemic Control  Diabetes history: DM 2 Outpatient Diabetes medications: None Current orders for Inpatient glycemic control: Novolog Sensitive + HS scale  Inpatient Diabetes Program Recommendations:   A1c 10.2% this admission Glucose elevated this am, consider starting Lantus 10-12 units (0.1 units/kg) Q 24hrs to start while inpatient and an oral med at discharge. Patient in stress test this am, will be there for a few hours. Will try to speak with patient this afternoon. If unable to get with patient RN to teach DM survival skills to patient (wrote a patient education order to help guide nursing staff) in addition to verifying if he can do glucose checks and operate an insulin pen.  Patient has insurance and if agreeable will be a great candidate for outpatient education as well. MD please place order.  Thanks,  Christena DeemShannon Jashayla Glatfelter RN, MSN, [facility] Inpatient Diabetes Coordinator Team Pager [phone] (8a-5p)

## 2016-05-10 NOTE — Interval H&P Note (Signed)
Cath Lab Visit (complete for each Cath Lab visit)  Clinical Evaluation Leading to the Procedure:   ACS: Yes.    Non-ACS:    Anginal Classification: CCS IV  Anti-ischemic medical therapy: Minimal Therapy (1 class of medications)  Non-Invasive Test Results: Intermediate-risk stress test findings: cardiac mortality 1-3%/year  Prior CABG: No previous CABG      History and Physical Interval Note:  05/10/2016 3:31 PM  [name]  has presented today for surgery, with the diagnosis of abnormal nuc  The various methods of treatment have been discussed with the patient and family. After consideration of risks, benefits and other options for treatment, the patient has consented to  Procedure(s): Left Heart Cath and Coronary Angiography (N/A) as a surgical intervention .  The patient's history has been reviewed, patient examined, no change in status, stable for surgery.  I have reviewed the patient's chart and labs.  Questions were answered to the patient's satisfaction.     Lance MussJayadeep [place]

## 2016-05-10 NOTE — Plan of Care (Signed)
Problem: Phase II Progression Outcomes Goal: Stress Test if indicated Outcome: Completed/Met Date Met:  05/10/16 For stress  Today

## 2016-05-10 NOTE — Progress Notes (Signed)
Pre procedure VS 

## 2016-05-10 NOTE — Progress Notes (Signed)
ANTICOAGULATION CONSULT NOTE - Follow Up Consult  Pharmacy Consult for Heparin  Indication: chest pain/ACS  No Known Allergies  Patient Measurements: Height: 5\' 9"  (175.3 cm) Weight: 201 lb 4.8 oz (91.309 kg) (b scale) IBW/kg (Calculated) : 70.7  Vital Signs: Temp: 98.8 F (37.1 C) (06/09 0131) Temp Source: Oral (06/09 0131) BP: 128/88 mmHg (06/09 0131) Pulse Rate: 73 (06/09 0131)  Labs:  Recent Labs  05/08/16 0931  05/09/16 0302 05/09/16 1327 05/09/16 1533 05/09/16 2035 05/09/16 2121 05/10/16 0305  HGB 16.7  --   --  16.3  --   --   --  14.7  HCT 45.5  --   --  46.2  --   --   --  42.4  PLT 154  --   --  153  --   --   --  138*  LABPROT 13.1  --   --   --   --   --   --   --   INR 0.97  --   --   --   --   --   --   --   HEPARINUNFRC  --   --   --   --   --  0.19*  --  0.16*  CREATININE 0.87  --   --  1.00  --   --   --   --   TROPONINI 0.04*  < > 0.17*  --  0.10*  --  0.09*  --   < > = values in this interval not displayed.  Estimated Creatinine Clearance: 103 mL/min (by C-G formula based on Cr of 1).   Assessment: Heparin for CP/elevated troponin, heparin level remains low, no issues per RN  Goal of Therapy:  Heparin level 0.3-0.7 units/ml Monitor platelets by anticoagulation protocol: Yes   Plan:  -Inc heparin to 1700 units/hr -1200 HL  Abran DukeLedford, Eimi Viney 05/10/2016,4:07 AM

## 2016-05-10 NOTE — Progress Notes (Signed)
Troponin 0.13, was 0.09, no S/S notified MD, will continue to monitor, Thanks Lavonda JumboMike F RN

## 2016-05-10 NOTE — Progress Notes (Signed)
     The patient was seen in nuclear medicine for a lexiscan myoview. He tolerated the procedure well. No acute ST or TW changes on ECG. Await nuclear images.    Valerya Maxton Stern PA-C  [facility]    

## 2016-05-10 NOTE — Progress Notes (Signed)
Inpatient Diabetes Program Recommendations  AACE/ADA: New Consensus Statement on Inpatient Glycemic Control (2015)  Target Ranges:  Prepandial:   less than 140 mg/dL      Peak postprandial:   less than 180 mg/dL (1-2 hours)      Critically ill patients:  140 - 180 mg/dL   Lab Results  Component Value Date   GLUCAP 161* 05/10/2016   HGBA1C 10.2* 05/09/2016    Review of Glycemic Control  Diabetes history: DM 2 Outpatient Diabetes medications: None Current orders for Inpatient glycemic control: Novolog Sensitive TID + HS scale  Spoke with patient about diabetes and home regimen for diabetes control. Patient reports that he is followed by his PCP (Dr. Lubertha SouthSteve [place]) for diabetes [place]. Patient last saw his PCP 1 year ago and did not tell him correct information about his glucose levels. He reported everything being fine when he was not checking nor taking Metformin at the time. Spoke to patient about A1C results (10.2% on 05/09/2016). Discussed glucose and A1C goals. Discussed importance of checking CBGs and maintaining good CBG control to prevent long-term and short-term complications. Spoke to him about checking his glucose at least twice a day while possibly being on Metformin again and long acting insulin. Explained how hyperglycemia leads to damage within blood vessels which lead to the common complications seen with uncontrolled diabetes. Stressed importance of glucose control on circulation and heart health. Discussed impact of nutrition, exercise, stress, sickness, and medications on diabetes control. Patient reports several stressors at his job. Patient states that he knows how to eat and what to do he just got tired of doing it. Discussed carbohydrates, carbohydrate goals per day and meal, along with portion sizes. Gave patient print outs on diet and exercise. Patient verbalized understanding of information discussed and he states that he has no further questions at  this time related to diabetes. Patient needs review on insulin drawing up and administration via vial and syringe. Patient to follow up with PCP at time of discharge.  Reviewed all steps if insulin pen including attachment of needle, 2-unit air shot, dialing up dose, giving injection, removing needle, disposal of sharps, storage of unused insulin, disposal of insulin etc. Patient able to provide successful return demonstration. Also reviewed troubleshooting with insulin pen.   MD please order at discharge: Basal insulin pen if desired Pen needles (order [phone]#[id number]) Metformin if desired   Thanks, Christena DeemShannon Jagger Demonte RN, MSN, [facility] Inpatient Diabetes Coordinator Team Pager [phone] (8a-5p)

## 2016-05-10 NOTE — Consult Note (Signed)
    CARDIOLOGY CONSULT NOTE   Patient ID: [name] MRN: [id number] DOB/AGE: 02/20/1971 45 y.o.  Admit date: 05/09/2016  Requesting Physician: Dr. Vega Primary Physician:   Steve Luking, MD Primary Cardiologist:  New Reason for Consultation: abnormal nuc  HPI: [name] is a 45 y.o. male with a history of uncontrolled DM, HTN, HLD, tobacco use and family history of CAD who presented to [facility] on 05/09/16 with chest pain. IM discussed with cardiology who agreed with nuclear study. This was performed today and returned intermediate risk with possible ischemia vs artifact and cardiology consulted.   He initially presented to the [place] [facility] on 6/7 after developing chest pain while at work. He was walking at work when he developed heaviness in the center of his chest that did not radiate; he described this as "an anvil on my chest". He states this pain was associated with shortness of breath and diaphoresis. He sat down and the pain did improve. He subsequently was transported via EMS to the [facility] and apparently received nitroglycerin in route and by the time he arrived his chest pain had resolved-this occurred over about 1 hour. On his initial EKG he had nonspecific ST segment changes in the inferior leads consisting of flattening out of the ST segments in leads 2 and aVF with somewhat of a downsloping of the ST segment in lead 3. Patient left AGAINST MEDICAL ADVICE  before a physician could speak with him.   He then represented to [facility] on 05/09/16 for further evaluation. His troponin was noted to be mildly elevated but with flat trend: 0.09--> 0.13--> 0.07. 2D ECHO showed normal LV function with no RWMAs. Cardiology was called but not consulted and agreed with nuclear stress testing. This was done today with a "medium defect of mild severity present in the basal inferior, basal inferolateral, mid inferior and mid inferolateral location." The study had signigicant artifact but ischemia  could not be ruled out.  The patient has remained chest pain free since admission. No LE edema, orthopnea or PND. NO dizziness or syncope. He says his father who was not a smoker had his first MI in his 60s and that every one on his dad's side has had strokes and heart attacks.   Of note, his HgA1c 10.2 and TC 239; TG 427; HDL 26; LDL unable to be calculated. He has not seen a PCP in a while and "only sees them if he has to." He has not taken any medications for DM, HTN or HLD since that time.   Past Medical History  Diagnosis Date  . Diabetes mellitus without complication (HCC)     diet controlled  . Hypertension     h/o of HTN, on no meds now.     Past Surgical History  Procedure Laterality Date  . Appendectomy    . Knee arthroscopy Left   . Ganglion cyst excision Left 10/12/2015    Procedure: EXCISION MUCOID TUMOR LEFT MIDDLE FINGER ;  Surgeon: Gary Kuzma, MD;  Location: [facility];  Service: Orthopedics;  Laterality: Left;  Left middle finger.    No Known Allergies  I have reviewed the patient's current medications . aspirin EC  325 mg Oral Daily  . insulin aspart  0-5 Units Subcutaneous QHS  . insulin aspart  0-9 Units Subcutaneous TID WC  . insulin starter kit- pen needles  1 kit Other Once  . living well with diabetes book   Does   not apply Once  . metoprolol tartrate  25 mg Oral BID  . nicotine  21 mg Transdermal Daily  . regadenoson  0.4 mg Intravenous Once   . heparin 1,700 Units/hr (05/10/16 0931)   acetaminophen, gi cocktail, LORazepam, morphine injection, ondansetron (ZOFRAN) IV  Prior to Admission medications   Medication Sig Start Date End Date Taking? Authorizing Provider  glucosamine-chondroitin 500-400 MG tablet Take 1 tablet by mouth daily.   Yes Historical Provider, MD  naproxen sodium (ANAPROX) 220 MG tablet Take 440 mg by mouth daily as needed (headache/pain).   Yes Historical Provider, MD     Social History   Social History  .  Marital Status: Married    Spouse Name: N/A  . Number of Children: N/A  . Years of Education: N/A   Occupational History  . Not on file.   Social History Main Topics  . Smoking status: Current Every Day Smoker -- 2.00 packs/day  . Smokeless tobacco: Not on file  . Alcohol Use: No  . Drug Use: Yes    Special: Marijuana     Comment: occasionally marjuana  . Sexual Activity: Not on file   Other Topics Concern  . Not on file   Social History Narrative    No family status information on file.   Family History  Problem Relation Age of Onset  . Diabetes Brother      ROS:  Full 14 point review of systems complete and found to be negative unless listed above.  Physical Exam: Blood pressure 181/88, pulse 102, temperature 97.5 F (36.4 C), temperature source Oral, resp. rate 18, height 5' 9" (1.753 m), weight 201 lb 12.8 oz (91.536 kg), SpO2 99 %.  General: Well developed, well nourished, male in no acute distress Head: Eyes PERRLA, No xanthomas.   Normocephalic and atraumatic, oropharynx without edema or exudate.  Lungs: CTAB Heart: HRRR S1 S2, no rub/gallop, Heart irregular rate and rhythm with S1, S2  murmur. pulses are 2+ extrem.   Neck: No carotid bruits. No lymphadenopathy. No JVD. Abdomen: Bowel sounds present, abdomen soft and non-tender without masses or hernias noted. Msk:  No spine or cva tenderness. No weakness, no joint deformities or effusions. Extremities: No clubbing or cyanosis.  No LE edema.  Neuro: Alert and oriented X 3. No focal deficits noted. Psych:  Good affect, responds appropriately Skin: No rashes or lesions noted.  Labs:   Lab Results  Component Value Date   WBC 7.7 05/10/2016   HGB 14.7 05/10/2016   HCT 42.4 05/10/2016   MCV 87.2 05/10/2016   PLT 138* 05/10/2016    Recent Labs  05/08/16 0931  INR 0.97    Recent Labs Lab 05/09/16 1327  NA 135  K 4.2  CL 101  CO2 25  BUN 11  CREATININE 1.00  CALCIUM 9.2  GLUCOSE 295*    MAGNESIUM  Date Value Ref Range Status  05/08/2016 1.8 1.7 - 2.4 mg/dL Final    Recent Labs  05/09/16 0302 05/09/16 1533 05/09/16 2121 05/10/16 0305  TROPONINI 0.17* 0.10* 0.09* 0.13*    Recent Labs  05/09/16 1339  TROPIPOC 0.10*   No results found for: PROBNP Lab Results  Component Value Date   CHOL 239* 05/10/2016   HDL 26* 05/10/2016   LDLCALC UNABLE TO CALCULATE IF TRIGLYCERIDE OVER 400 mg/dL 05/10/2016   TRIG 427* 05/10/2016   Echo: 05/09/2016 LV EF: 55% - 60% Study Conclusions - Left ventricle: The cavity size was normal. There was mild    concentric hypertrophy. Systolic function was normal. The  estimated ejection fraction was in the range of 55% to 60%. Wall  motion was normal; there were no regional wall motion  abnormalities. The transmitral flow pattern was normal. Left  ventricular diastolic function parameters were normal. - Aortic valve: Trileaflet; normal thickness, mildly calcified  leaflets. - Mitral valve: There was trivial regurgitation.  ECG:  NSR HR 93  Radiology:     05/10/16 Study Result      Defect 1: There is a medium defect of mild severity present in the basal inferior, basal inferolateral, mid inferior and mid inferolateral location.  Findings consistent with ischemia.  This is an intermediate risk study.  The left ventricular ejection fraction is normal (55-65%).  There was no ST segment deviation noted during stress.  This study is significantly affected by artifacts. However, there appears to be medium size, mild severity reversible defect in the basal and mid inferior and inferolateral walls (SDS =4). LVEF is normal.      ASSESSMENT AND PLAN:    Principal Problem:   Chest pain Active Problems:   Diabetes mellitus type II, uncontrolled (HCC)   Essential hypertension   Tobacco abuse  Chest pain / intermediate risk stress test: troponin was noted to be mildly elevated but with flat trend: 0.09--> 0.13-->  0.07. Stress test today with significant artifact but possible medium size, mild severity reversible defect in the basal and mid inferior and inferolateral walls. I discussed with Dr. Starrucca and we have decided to proceed coronary angiography for definitive evaluation of coronary anatomy. Continue heparin gtt for now.  The patient understands that risks include but are not limited to stroke (1 in 1000), death (1 in 1000), kidney failure [usually temporary] (1 in 500), bleeding (1 in 200), allergic reaction [possibly serious] (1 in 200), and agrees to proceed.   DMT2: he has not taken any medications for this in quite some time. His HgA1c is >10. Would restart diabetes regimen per IM  HTN: BP has been significantly elevated since admission. He was started on Lopressor 25mg BID but BPs still quite hight. Will add Lisinopril 10 mg daily in the setting of DMT2.  HLD: will start atorvastatin    Tobacco abuse: counseled on cessation   Signed: Rai Severns, PA-C 05/10/2016 12:34 PM  Pager [phone]  Co-Sign MD   

## 2016-05-10 NOTE — Progress Notes (Signed)
1 min, Katie PA at bedside, pt denies any CP/SOB/ or anyother associated symptoms

## 2016-05-11 ENCOUNTER — Inpatient Hospital Stay (HOSPITAL_COMMUNITY): Payer: BLUE CROSS/BLUE SHIELD

## 2016-05-11 DIAGNOSIS — I10 Essential (primary) hypertension: Secondary | ICD-10-CM

## 2016-05-11 DIAGNOSIS — E1100 Type 2 diabetes mellitus with hyperosmolarity without nonketotic hyperglycemic-hyperosmolar coma (NKHHC): Secondary | ICD-10-CM

## 2016-05-11 DIAGNOSIS — I2510 Atherosclerotic heart disease of native coronary artery without angina pectoris: Secondary | ICD-10-CM

## 2016-05-11 DIAGNOSIS — I25110 Atherosclerotic heart disease of native coronary artery with unstable angina pectoris: Secondary | ICD-10-CM

## 2016-05-11 LAB — GLUCOSE, CAPILLARY
GLUCOSE-CAPILLARY: 144 mg/dL — AB (ref 65–99)
Glucose-Capillary: 166 mg/dL — ABNORMAL HIGH (ref 65–99)
Glucose-Capillary: 230 mg/dL — ABNORMAL HIGH (ref 65–99)
Glucose-Capillary: 202 mg/dL — ABNORMAL HIGH (ref 65–99)

## 2016-05-11 LAB — CBC
HCT: 43.5 % (ref 39.0–52.0)
HEMOGLOBIN: 15.2 g/dL (ref 13.0–17.0)
MCH: 30.6 pg (ref 26.0–34.0)
MCHC: 34.9 g/dL (ref 30.0–36.0)
MCV: 87.7 fL (ref 78.0–100.0)
Platelets: 155 10*3/uL (ref 150–400)
RBC: 4.96 MIL/uL (ref 4.22–5.81)
RDW: 12.2 % (ref 11.5–15.5)
WBC: 8 10*3/uL (ref 4.0–10.5)

## 2016-05-11 LAB — HEPARIN LEVEL (UNFRACTIONATED)
Heparin Unfractionated: 0.39 IU/mL (ref 0.30–0.70)
Heparin Unfractionated: 0.66 IU/mL (ref 0.30–0.70)

## 2016-05-11 MED ORDER — INSULIN GLARGINE 100 UNIT/ML ~~LOC~~ SOLN
14.0000 [IU] | Freq: Two times a day (BID) | SUBCUTANEOUS | Status: DC
  Administered 2016-05-11 – 2016-05-12 (×3): 14 [IU] via SUBCUTANEOUS
  Filled 2016-05-11 (×6): qty 0.14

## 2016-05-11 NOTE — Progress Notes (Signed)
PROGRESS NOTE    [name]  JQG:[id number] DOB: 06-Aug-1971 DOA: 05/09/2016 PCP: Mickie Hillier, MD    Brief Narrative:  45 y.o. male with a Past Medical History of DM, HTN, Tobacco use who presents with CP. Very concerning for cardiac etiology.   Assessment & Plan:   Principal Problem:   Chest pain/ NSTEMI (non-ST elevated myocardial infarction) [facility]) - Cardiology on board and managing. They have consulted the Cardiothoracic surgeon as patient will require bypass surgery. Please refer to cardiology notes for details.  Active Problems:   Diabetes mellitus type II, uncontrolled ([name]) - SSI - diabetic diet    Essential hypertension - on ACEI and B blocker    Tobacco abuse - nicotine patch in place - pt motivated to quit smoking   DVT prophylaxis: heparin gtt Code Status: full Family Communication: d/c pt directly Disposition Plan: Pending recommended workup by cardiology   Consultants:   Cardiology   Procedures: Please refer to EMR and cardiology notes   Antimicrobials: None   Subjective: Patient has no new complaints.  Objective: Filed Vitals:   05/11/16 0700 05/11/16 0745 05/11/16 0900 05/11/16 1140  BP: 130/83 143/87 128/82 110/87  Pulse: 67 72 86 74  Temp:  98 F (36.7 C)  98.1 F (36.7 C)  TempSrc:  Oral  Oral  Resp:  22  18  Height:      Weight:      SpO2: 97% 97% 97% 97%    Intake/Output Summary (Last 24 hours) at 05/11/16 1512 Last data filed at 05/11/16 1300  Gross per 24 hour  Intake 1126.43 ml  Output    700 ml  Net 426.43 ml   Filed Weights   05/09/16 2011 05/10/16 0634 05/10/16 1634  Weight: 91.309 kg (201 lb 4.8 oz) 91.536 kg (201 lb 12.8 oz) 91.5 kg (201 lb 11.5 oz)    Examination:  General exam: Appears calm and comfortable  Respiratory system: Clear to auscultation. Respiratory effort normal. Cardiovascular system: No cyanosis Gastrointestinal system: Abdomen is nondistended, soft and nontender.  Central nervous system:  Alert and oriented. No focal neurological deficits. Extremities: Symmetric 5 x 5 power. Skin: No rashes, lesions or ulcers Psychiatry: Judgement and insight appear normal. Mood & affect appropriate.     Data Reviewed: I have personally reviewed following labs and imaging studies  CBC:  Recent Labs Lab 05/08/16 0931 05/09/16 1327 05/10/16 0305 05/11/16 0227  WBC 8.5 8.1 7.7 8.0  HGB 16.7 16.3 14.7 15.2  HCT 45.5 46.2 42.4 43.5  MCV 87.8 86.7 87.2 87.7  PLT 154 153 138* 197   Basic Metabolic Panel:  Recent Labs Lab 05/08/16 0931 05/09/16 1327  NA 136 135  K 3.5 4.2  CL 102 101  CO2 27 25  GLUCOSE 318* 295*  BUN 14 11  CREATININE 0.87 1.00  CALCIUM 9.1 9.2  MG 1.8  --    GFR: Estimated Creatinine Clearance: 103.1 mL/min (by C-G formula based on Cr of 1). Liver Function Tests: No results for input(s): AST, ALT, ALKPHOS, BILITOT, PROT, ALBUMIN in the last 168 hours. No results for input(s): LIPASE, AMYLASE in the last 168 hours. No results for input(s): AMMONIA in the last 168 hours. Coagulation Profile:  Recent Labs Lab 05/08/16 0931  INR 0.97   Cardiac Enzymes:  Recent Labs Lab 05/09/16 0302 05/09/16 1533 05/09/16 2121 05/10/16 0305 05/10/16 1105  TROPONINI 0.17* 0.10* 0.09* 0.13* 0.07*   BNP (last 3 results) No results for input(s): PROBNP in the last  8760 hours. HbA1C:  Recent Labs  05/09/16 1327  HGBA1C 10.2*   CBG:  Recent Labs Lab 05/10/16 1321 05/10/16 1718 05/10/16 2129 05/11/16 0747 05/11/16 1137  GLUCAP 161* 99 169* 166* 230*   Lipid Profile:  Recent Labs  05/10/16 0305  CHOL 239*  HDL 26*  LDLCALC UNABLE TO CALCULATE IF TRIGLYCERIDE OVER 400 mg/dL  TRIG 427*  CHOLHDL 9.2   Thyroid Function Tests: No results for input(s): TSH, T4TOTAL, FREET4, T3FREE, THYROIDAB in the last 72 hours. Anemia Panel: No results for input(s): VITAMINB12, FOLATE, FERRITIN, TIBC, IRON, RETICCTPCT in the last 72 hours. Sepsis Labs: No  results for input(s): PROCALCITON, LATICACIDVEN in the last 168 hours.  Recent Results (from the past 240 hour(s))  MRSA PCR Screening     Status: None   Collection Time: 05/10/16  4:44 PM  Result Value Ref Range Status   MRSA by PCR NEGATIVE NEGATIVE Final    Comment:        The GeneXpert MRSA Assay (FDA approved for NASAL specimens only), is one component of a comprehensive MRSA colonization surveillance program. It is not intended to diagnose MRSA infection nor to guide or monitor treatment for MRSA infections.          Radiology Studies: Nm Myocar Multi W/spect W/wall Motion / Ef  05/10/2016   Defect 1: There is a medium defect of mild severity present in the basal inferior, basal inferolateral, mid inferior and mid inferolateral location.  Findings consistent with ischemia.  This is an intermediate risk study.  The left ventricular ejection fraction is normal (55-65%).  There was no ST segment deviation noted during stress.  This study is significantly affected by artifacts. However, there appears to be medium size, mild severity reversible defect in the basal and mid inferior and inferolateral walls (SDS =4). LVEF is normal.        Scheduled Meds: . aspirin  81 mg Oral Daily  . atorvastatin  80 mg Oral q1800  . carvedilol  12.5 mg Oral BID WC  . insulin aspart  0-5 Units Subcutaneous QHS  . insulin aspart  0-9 Units Subcutaneous TID WC  . insulin glargine  14 Units Subcutaneous BID  . insulin starter kit- pen needles  1 kit Other Once  . lisinopril  10 mg Oral Daily  . nicotine  21 mg Transdermal Daily  . regadenoson  0.4 mg Intravenous Once  . sodium chloride flush  3 mL Intravenous Q12H   Continuous Infusions: . heparin 1,700 Units/hr (05/11/16 1204)     LOS: 1 day    Time spent: > 20 minutes    Velvet Bathe, MD [facility] Pager [phone]  If 7PM-7AM, please contact night-coverage [email or web address] Password [email or web address] 05/11/2016, 3:12 PM

## 2016-05-11 NOTE — Consult Note (Signed)
[address]       [place] [address]             [place] Record #[id number] Date of Birth: 1971/02/28  Referring:Varanasi MD Primary Care: Mickie Hillier, MD  Chief Complaint:    Chief Complaint  Patient presents with  . Chest Pain  Patient examined, coronary angiograms and 2-D echocardiogram personally reviewed and counseled with patient  History of Present Illness:     45 year old smoker with poorly controlled diabetes[hemoglobin A1c 10.5] presents with sudden onset chest wall at work associated with shortness of breath. He presented to the [facility] ED and was found have nonspecific EKG changes and was mildly elevated cardiac enzymes troponin. The patient left the ED AMA after chest pain resolved with nitroglycerin.  Patient presented to [facility] on June 8 with similar symptoms and mildly elevated troponin. Echocardiogram was normal. The patient agreed to nuclear stress testing which suddenly showed ischemic changes in the inferior and inferolateral wall. The patient was admitted to the hospital and underwent cardiac catheterization. This was performed by Dr. Saundra Shelling The patient was found have significant multivessel CAD. LVEDP was normal. He is admitted and placed on IV heparin. CT surgical evaluation was requested. His chest x-ray is normal. His pre-CABG carotid Doppler show mild-moderate left carotid disease.  The patient states multiple male members of his family have had MI at early age. The patient's triglyceride level is elevated above the range of the test.   Current Activity/ Functional Status: The patient works at a [employer]   Zubrod Score: At the time of surgery this patient's most appropriate activity status/level should be described as: '[]'     0    Normal activity, no symptoms '[x]'     1    Restricted in physical strenuous activity but ambulatory, able to do out light work '[]'     2     Ambulatory and capable of self care, unable to do work activities, up and about                 more than 50%  Of the time                            '[]'     3    Only limited self care, in bed greater than 50% of waking hours '[]'     4    Completely disabled, no self care, confined to bed or chair '[]'     5    Moribund  Past Medical History  Diagnosis Date  . Diabetes mellitus without complication (HCC)     diet controlled  . Hypertension     h/o of HTN, on no meds now.    Past Surgical History  Procedure Laterality Date  . Appendectomy    . Knee arthroscopy Left   . Ganglion cyst excision Left 10/12/2015    Procedure: EXCISION MUCOID TUMOR LEFT MIDDLE FINGER ;  Surgeon: Daryll Brod, MD;  Location: [place];  Service: Orthopedics;  Laterality: Left;  Left middle finger.    History  Smoking status  . Current Every Day Smoker -- 2.00 packs/day  Smokeless tobacco  . Not on file    History  Alcohol Use No    Social History   Social History  . Marital Status: Married    Spouse  Name: N/A  . Number of Children: N/A  . Years of Education: N/A   Occupational History  . Not on file.   Social History Main Topics  . Smoking status: Current Every Day Smoker -- 2.00 packs/day  . Smokeless tobacco: Not on file  . Alcohol Use: No  . Drug Use: Yes    Special: Marijuana     Comment: occasionally marjuana  . Sexual Activity: Not on file   Other Topics Concern  . Not on file   Social History Narrative    No Known Allergies  Current Facility-Administered Medications  Medication Dose Route Frequency Provider Last Rate Last Dose  . 0.9 %  sodium chloride infusion  250 mL Intravenous PRN Jettie Booze, MD      . acetaminophen (TYLENOL) tablet 650 mg  650 mg Oral Q4H PRN Samella Parr, NP      . aspirin chewable tablet 81 mg  81 mg Oral Daily Skeet Latch, MD   81 mg at 05/11/16 0952  . atorvastatin (LIPITOR) tablet 80 mg  80 mg Oral q1800 Eileen Stanford, PA-C   80 mg at 05/10/16 1756  . carvedilol (COREG) tablet 12.5 mg  12.5 mg Oral BID WC Skeet Latch, MD   12.5 mg at 05/11/16 0830  . gi cocktail (Maalox,Lidocaine,Donnatal)  30 mL Oral QID PRN Samella Parr, NP      . heparin ADULT infusion 100 units/mL (25000 units/252m sodium chloride 0.45%)  1,700 Units/hr Intravenous Continuous JJettie Booze MD 17 mL/hr at 05/11/16 1204 1,700 Units/hr at 05/11/16 1204  . insulin aspart (novoLOG) injection 0-5 Units  0-5 Units Subcutaneous QHS ASamella Parr NP   0 Units at 05/09/16 2150  . insulin aspart (novoLOG) injection 0-9 Units  0-9 Units Subcutaneous TID WC ASamella Parr NP   3 Units at 05/11/16 1201  . insulin glargine (LANTUS) injection 14 Units  14 Units Subcutaneous QHS PIvin Poot MD   14 Units at 05/10/16 2152  . insulin starter kit- pen needles (English) 1 kit  1 kit Other Once OVelvet Bathe MD      . lisinopril (PRINIVIL,ZESTRIL) tablet 10 mg  10 mg Oral Daily KEileen Stanford PA-C   10 mg at 05/11/16 09476 . LORazepam (ATIVAN) tablet 1 mg  1 mg Oral Q6H PRN ASamella Parr NP      . morphine 2 MG/ML injection 2 mg  2 mg Intravenous Q2H PRN ASamella Parr NP      . nicotine (NICODERM CQ - dosed in mg/24 hours) patch 21 mg  21 mg Transdermal Daily ASamella Parr NP   21 mg at 05/11/16 0952  . ondansetron (ZOFRAN) injection 4 mg  4 mg Intravenous Q6H PRN ASamella Parr NP      . regadenoson (Carlton Adam injection SOLN 0.4 mg  0.4 mg Intravenous Once ASamella Parr NP      . sodium chloride flush (NS) 0.9 % injection 3 mL  3 mL Intravenous Q12H JJettie Booze MD   3 mL at 05/10/16 2015  . sodium chloride flush (NS) 0.9 % injection 3 mL  3 mL Intravenous PRN JJettie Booze MD        Prescriptions prior to admission  Medication Sig Dispense Refill Last Dose  . glucosamine-chondroitin 500-400 MG tablet Take 1 tablet by mouth daily.   Past Week at Unknown time  . naproxen sodium (ANAPROX) 220  MG tablet Take 440 mg  by mouth daily as needed (headache/pain).   Past Week at Unknown time    Family History  Problem Relation Age of Onset  . Diabetes Brother      Review of Systems:       Cardiac Review of Systems: Y or N  Chest Pain [ Yes   ]  Resting SOB [ no  ] Exertional SOB  [ yes ]  Orthopnea [no  ]   Pedal Edema [ no  ]    Palpitations no[  ] Syncope  [  no]   Presyncope [ no  ]  General Review of Systems: [Y] = yes [  ]=no Constitional: recent weight change [  ]; anorexia [  ]; fatigue [  ]; nausea [  ]; night sweats [  ]; fever [  ]; or chills [  ]                                                               Dental: poor dentition[  ]; Last Dentist visit: Greater than one year  Eye : blurred vision [  ]; diplopia [   ]; vision changes [  ];  Amaurosis fugax[  ]; Resp: cough [ yes ];  wheezing[  ];  hemoptysis[  ]; shortness of breath[  ]; paroxysmal nocturnal dyspnea[  ]; dyspnea on exertion[  ]; or orthopnea[  ];  GI:  gallstones[  ], vomiting[  ];  dysphagia[  ]; melena[  ];  hematochezia [  ]; heartburn[  ];   Hx of  Colonoscopy[  ]; GU: kidney stones [  ]; hematuria[  ];   dysuria [  ];  nocturia[  ];  history of     obstruction [  ]; urinary frequency [  ]             Skin: rash, swelling[  ];, hair loss[  ];  peripheral edema[  ];  or itching[  ]; Musculosketetal: myalgias[  ];  joint swelling[  ];  joint erythema[  ];  joint pain[  ];  back pain[ yes ];  Heme/Lymph: bruising[  ];  bleeding[  ];  anemia[  ];  Neuro: TIA[  ];  headaches[  ];  stroke[  ];  vertigo[  ];  seizures[  ];   paresthesias[  ];  difficulty walking[  ];  Psych:depression[  ]; anxiety[  ];  Endocrine: diabetes[ yes  ];  thyroid dysfunction[  ];  Immunizations: Flu [  ]; Pneumococcal[  ];  Other: No previous thoracic trauma or pneumothorax  Physical Exam: BP 110/87 mmHg  Pulse 74  Temp(Src) 98.1 F (36.7 C) (Oral)  Resp 18  Ht '5\' 9"'  (1.753 m)  Wt 201 lb 11.5 oz (91.5 kg)  BMI 29.78 kg/m2   SpO2 97%       Physical Exam  General: Well-nourished middle-aged Caucasian male no acute distress HEENT: Normocephalic pupils equal , dentition adequate Neck: Supple without JVD, adenopathy, or bruit Chest: Clear to auscultation, symmetrical breath sounds, no rhonchi, no tenderness             or deformity Cardiovascular: Regular rate and rhythm, no murmur, no gallop, peripheral pulses             palpable in all extremities  Abdomen:  Soft, nontender, no palpable mass or organomegaly Extremities: Warm, well-perfused, no clubbing cyanosis edema or tenderness,              no venous stasis changes of the legs. No hematoma at right wrist cardiac cath site Rectal/GU: Deferred Neuro: Grossly non--focal and symmetrical throughout Skin: Clean and dry without rash or ulceration   Diagnostic Studies & Laboratory data:     Recent Radiology Findings:   Nm Myocar Multi W/spect W/wall Motion / Ef  05/10/2016   Defect 1: There is a medium defect of mild severity present in the basal inferior, basal inferolateral, mid inferior and mid inferolateral location.  Findings consistent with ischemia.  This is an intermediate risk study.  The left ventricular ejection fraction is normal (55-65%).  There was no ST segment deviation noted during stress.  This study is significantly affected by artifacts. However, there appears to be medium size, mild severity reversible defect in the basal and mid inferior and inferolateral walls (SDS =4). LVEF is normal.     I have independently reviewed the above radiologic studies.  Recent Lab Findings: Lab Results  Component Value Date   WBC 8.0 05/11/2016   HGB 15.2 05/11/2016   HCT 43.5 05/11/2016   PLT 155 05/11/2016   GLUCOSE 295* 05/09/2016   CHOL 239* 05/10/2016   TRIG 427* 05/10/2016   HDL 26* 05/10/2016   LDLCALC UNABLE TO CALCULATE IF TRIGLYCERIDE OVER 400 mg/dL 05/10/2016   NA 135 05/09/2016   K 4.2 05/09/2016   CL 101 05/09/2016   CREATININE  1.00 05/09/2016   BUN 11 05/09/2016   CO2 25 05/09/2016   INR 0.97 05/08/2016   HGBA1C 10.2* 05/09/2016      Assessment / Plan:     Severe multivessel coronary disease with preserved LV systolic function    Diabetes mellitus type 2-poorly controlled    Hyperlipidemia, elevated triglyceride on atorvastatin    Hypertension    Tobacco abuse  I agree with recommendation for multivessel CABG in this patient. I discussed indications and expected benefits alternatives and potential risks of CABG with the patient. We'll schedule for multivessel coronary revascularization on Monday June 12.    05/11/2016 1:52 PM

## 2016-05-11 NOTE — Progress Notes (Signed)
ANTICOAGULATION CONSULT NOTE - Follow Up Consult  Pharmacy Consult for Heparin  Indication: chest pain/ACS  No Known Allergies  Patient Measurements: Height: 5\' 9"  (175.3 cm) Weight: 201 lb 11.5 oz (91.5 kg) IBW/kg (Calculated) : 70.7  Vital Signs: Temp: 98.1 F (36.7 C) (06/10 1140) Temp Source: Oral (06/10 1140) BP: 110/87 mmHg (06/10 1140) Pulse Rate: 74 (06/10 1140)  Labs:  Recent Labs  05/09/16 1327  05/09/16 2121 05/10/16 0305 05/10/16 1105 05/11/16 0227 05/11/16 1131  HGB 16.3  --   --  14.7  --  15.2  --   HCT 46.2  --   --  42.4  --  43.5  --   PLT 153  --   --  138*  --  155  --   HEPARINUNFRC  --   < >  --  0.16* 0.19* 0.39 0.66  CREATININE 1.00  --   --   --   --   --   --   TROPONINI  --   < > 0.09* 0.13* 0.07*  --   --   < > = values in this interval not displayed.  Estimated Creatinine Clearance: 103.1 mL/min (by C-G formula based on Cr of 1).   Assessment: [age] YOM who presented to [facility] on 6/7 with CP and left AMA and then [facility] and treatment. Confirmatory HL is therapeutic.  Goal of Therapy:  Heparin level 0.3-0.7 units/ml Monitor platelets by anticoagulation protocol: Yes   Plan:  -Cont heparin at 1700 units/hr -Daily HL and CBC -Monitor for signs and symptoms of bleeding  Sherron MondayAubrey N. Keina Mutch, PharmD Clinical Pharmacy Resident Pager: [phone] 05/11/2016 2:02 PM

## 2016-05-11 NOTE — Progress Notes (Addendum)
Pre-op Cardiac Surgery  Carotid Findings:  The right internal carotid artery exhibits 1-39% stenosis. The left internal carotid artery exhibits elevated velocities suggestive of upper range 40-59% stenosis. The left vertebral artery is patent with antegrade flow. Unable to visualize the right vertebral artery.  05/11/2016 11:05 AM  Upper Extremity Right Left  Brachial Pressures 131-Triphasic 127-Triphasic  Radial Waveforms Triphasic Triphasic  Ulnar Waveforms Triphasic Triphasic  Palmar Arch (Allen's Test) Within normal limits Signal obliterates with both radial and ulnar compression.    Lower  Extremity Right Left  Dorsalis Pedis 169-Triphasic 147-Triphasic  Anterior Tibial    Posterior Tibial 140-Triphasic 145-Biphasic  Ankle/Brachial Indices 1.29 1.12    Findings:   Bilateral ABIs are within normal limits.  05/11/2016 3:12 PM Gertie FeyMichelle Zephaniah Enyeart, RVT, RDCS, RDMS

## 2016-05-11 NOTE — Progress Notes (Signed)
ANTICOAGULATION CONSULT NOTE - Follow Up Consult  Pharmacy Consult for Heparin  Indication: chest pain/ACS  No Known Allergies  Patient Measurements: Height: 5\' 9"  (175.3 cm) Weight: 201 lb 11.5 oz (91.5 kg) IBW/kg (Calculated) : 70.7  Vital Signs: Temp: 97.3 F (36.3 C) (06/10 0300) Temp Source: Oral (06/10 0300) BP: 125/72 mmHg (06/09 2000) Pulse Rate: 72 (06/09 2000)  Labs:  Recent Labs  05/08/16 0931  05/09/16 1327  05/09/16 2121 05/10/16 0305 05/10/16 1105 05/11/16 0227  HGB 16.7  --  16.3  --   --  14.7  --  15.2  HCT 45.5  --  46.2  --   --  42.4  --  43.5  PLT 154  --  153  --   --  138*  --  155  LABPROT 13.1  --   --   --   --   --   --   --   INR 0.97  --   --   --   --   --   --   --   HEPARINUNFRC  --   --   --   < >  --  0.16* 0.19* 0.39  CREATININE 0.87  --  1.00  --   --   --   --   --   TROPONINI 0.04*  < >  --   < > 0.09* 0.13* 0.07*  --   < > = values in this interval not displayed.  Estimated Creatinine Clearance: 103.1 mL/min (by C-G formula based on Cr of 1).   Assessment: [age] YOM who presented to [facility] on 6/7 with CP and left AMA and then [facility] for evaluation and treatment. Heparin level is therapeutic this AM  Goal of Therapy:  Heparin level 0.3-0.7 units/ml Monitor platelets by anticoagulation protocol: Yes   Plan:  -Cont heparin at 1700 units/hr -1200 HL  Abran DukeJames Izola Teague, PharmD Clinical Pharmacist Phone: [phone]

## 2016-05-11 NOTE — Progress Notes (Signed)
Pt watched Preparing for cardiac surgery educational video

## 2016-05-11 NOTE — Progress Notes (Signed)
Subjective:   Cath with severe 3vCAD (05/10/16) normal EF.  Pending TCTS consult. (Dr. Prescott Gum has seen)  Denies CP or dyspnea. Carotid u/s today with 37-10% LICA     Intake/Output Summary (Last 24 hours) at 05/11/16 1049 Last data filed at 05/11/16 1000  Gross per 24 hour  Intake 1075.43 ml  Output    400 ml  Net 675.43 ml    Current meds: . aspirin  81 mg Oral Daily  . atorvastatin  80 mg Oral q1800  . carvedilol  12.5 mg Oral BID WC  . insulin aspart  0-5 Units Subcutaneous QHS  . insulin aspart  0-9 Units Subcutaneous TID WC  . insulin glargine  14 Units Subcutaneous QHS  . insulin starter kit- pen needles  1 kit Other Once  . lisinopril  10 mg Oral Daily  . nicotine  21 mg Transdermal Daily  . regadenoson  0.4 mg Intravenous Once  . sodium chloride flush  3 mL Intravenous Q12H   Infusions: . heparin 1,700 Units/hr (05/10/16 2151)     Objective:  Blood pressure 128/82, pulse 86, temperature 98 F (36.7 C), temperature source Oral, resp. rate 22, height '5\' 9"'  (6.269 m), weight 91.5 kg (201 lb 11.5 oz), SpO2 97 %. Weight change: 0.191 kg (6.7 oz)  Physical Exam: General:  Sitting in bed  No resp difficulty HEENT: normal Neck: supple. JVP . Carotids 2+ bilat; no bruits. No lymphadenopathy or thryomegaly appreciated. Cor: PMI nondisplaced. Regular rate & rhythm. No rubs, gallops or murmurs. Lungs: clear but decreased BS Abdomen: soft, nontender, nondistended. No hepatosplenomegaly. No bruits or masses. Good bowel sounds. Extremities: no cyanosis, clubbing, rash, edema Neuro: alert & orientedx3, cranial nerves grossly intact. moves all 4 extremities w/o difficulty. Affect pleasant  Telemetry: NSR   Lab Results: Basic Metabolic Panel:  Recent Labs Lab 05/08/16 0931 05/09/16 1327  NA 136 135  K 3.5 4.2  CL 102 101  CO2 27 25  GLUCOSE 318* 295*  BUN 14 11  CREATININE 0.87 1.00  CALCIUM 9.1 9.2  MG 1.8  --    Liver Function Tests: No results for  input(s): AST, ALT, ALKPHOS, BILITOT, PROT, ALBUMIN in the last 168 hours. No results for input(s): LIPASE, AMYLASE in the last 168 hours. No results for input(s): AMMONIA in the last 168 hours. CBC:  Recent Labs Lab 05/08/16 0931 05/09/16 1327 05/10/16 0305 05/11/16 0227  WBC 8.5 8.1 7.7 8.0  HGB 16.7 16.3 14.7 15.2  HCT 45.5 46.2 42.4 43.5  MCV 87.8 86.7 87.2 87.7  PLT 154 153 138* 155   Cardiac Enzymes:  Recent Labs Lab 05/09/16 0302 05/09/16 1533 05/09/16 2121 05/10/16 0305 05/10/16 1105  TROPONINI 0.17* 0.10* 0.09* 0.13* 0.07*   BNP: Invalid input(s): POCBNP CBG:  Recent Labs Lab 05/10/16 0555 05/10/16 1321 05/10/16 1718 05/10/16 2129 05/11/16 0747  GLUCAP 231* 161* 99 169* 166*   Microbiology: No results found for: CULT No results for input(s): CULT, SDES in the last 168 hours.  Imaging: Nm Myocar Multi W/spect W/wall Motion / Ef  05/10/2016   Defect 1: There is a medium defect of mild severity present in the basal inferior, basal inferolateral, mid inferior and mid inferolateral location.  Findings consistent with ischemia.  This is an intermediate risk study.  The left ventricular ejection fraction is normal (55-65%).  There was no ST segment deviation noted during stress.  This study is significantly affected by artifacts. However, there appears to be medium size,  mild severity reversible defect in the basal and mid inferior and inferolateral walls (SDS =4). LVEF is normal.     ASSESSMENT:  1. NSTEMI 2. 3v CAD with normal EF 3. DM2, poorly controlled 4. Hyperlipidemia/hypertriglyderidemia 5. Tobacco use 6. Metabolic syndrome 7. Left carotid stenosis.   PLAN/DISCUSSION:  He has severe 3v CAD. Will need CABG. Continue heparin, ASA, statin, bb-blocker. Dr. Montez Hageman has seen  Continue nicotine patch.   Can go to SDU.    LOS: 1 day    Glori Bickers, MD 05/11/2016, 10:49 AM

## 2016-05-11 NOTE — Progress Notes (Signed)
1 Day Post-Op Procedure(s) (LRB): Left Heart Cath and Coronary Angiography (N/A) Subjective: No chest pain on IV heparin Left arm with positive Allen's test Blood sugars improved with sliding scale plus Lantus insulin CABG Monday morning Procedure, expected recovery, and risks reviewed with patient  Objective: Vital signs in last 24 hours: Temp:  [97.3 F (36.3 C)-98.1 F (36.7 C)] 97.5 F (36.4 C) (06/10 1629) Pulse Rate:  [64-87] 81 (06/10 1735) Cardiac Rhythm:  [-] Normal sinus rhythm (06/10 0745) Resp:  [18-27] 18 (06/10 1629) BP: (109-160)/(70-120) 142/85 mmHg (06/10 1735) SpO2:  [96 %-100 %] 98 % (06/10 1735)  Hemodynamic parameters for last 24 hours:  stable  Intake/Output from previous day: 06/09 0701 - 06/10 0700 In: 784.4 [P.O.:240; I.V.:544.4] Out: 400 [Urine:400] Intake/Output this shift: Total I/O In: 627 [P.O.:440; I.V.:187] Out: 300 [Urine:300]      Physical Exam  General:  HEENT: Normocephalic pupils equal , dentition adequate Neck: Supple without JVD, adenopathy, or bruit Chest: Clear to auscultation, symmetrical breath sounds, no rhonchi, no tenderness             or deformity Cardiovascular: Regular rate and rhythm, no murmur, no gallop, peripheral pulses             palpable in all extremities Abdomen:  Soft, nontender, no palpable mass or organomegaly Extremities: Warm, well-perfused, no clubbing cyanosis edema or tenderness,              no venous stasis changes of the legs Rectal/GU: Deferred Neuro: Grossly non--focal and symmetrical throughout Skin: Clean and dry without rash or ulceration   Lab Results:  Recent Labs  05/10/16 0305 05/11/16 0227  WBC 7.7 8.0  HGB 14.7 15.2  HCT 42.4 43.5  PLT 138* 155   BMET:  Recent Labs  05/09/16 1327  NA 135  K 4.2  CL 101  CO2 25  GLUCOSE 295*  BUN 11  CREATININE 1.00  CALCIUM 9.2    PT/INR: No results for input(s): LABPROT, INR in the last 72 hours. ABG No results found for:  PHART, HCO3, TCO2, ACIDBASEDEF, O2SAT CBG (last 3)   Recent Labs  05/11/16 0747 05/11/16 1137 05/11/16 1627  GLUCAP 166* 230* 144*    Assessment/Plan: S/P Procedure(s) (LRB): Left Heart Cath and Coronary Angiography (N/A) CABG Monday   LOS: 1 day    Kathlee Nationseter Van Trigt III 05/11/2016

## 2016-05-11 NOTE — Progress Notes (Signed)
Discussed sternal precautions, IS, mobility, and d/c planning with pt and son. Voiced understanding, very receptive. Also gave him smoking cessation information. He will watch video later and already has [facility] booklet. Will hold ambulation due to significant LM.  [id number] Ethelda ChickKristan Maven Rosander CES, ACSM 1:54 PM 05/11/2016

## 2016-05-11 NOTE — Progress Notes (Signed)
Gave pt Cardiac surgery education booklet and instructed on incentive spirometer use.

## 2016-05-12 LAB — CBC
HEMATOCRIT: 43.4 % (ref 39.0–52.0)
Hemoglobin: 15 g/dL (ref 13.0–17.0)
MCH: 30.7 pg (ref 26.0–34.0)
MCHC: 34.6 g/dL (ref 30.0–36.0)
MCV: 88.8 fL (ref 78.0–100.0)
Platelets: 136 10*3/uL — ABNORMAL LOW (ref 150–400)
RBC: 4.89 MIL/uL (ref 4.22–5.81)
RDW: 12.1 % (ref 11.5–15.5)
WBC: 8.9 10*3/uL (ref 4.0–10.5)

## 2016-05-12 LAB — GLUCOSE, CAPILLARY
Glucose-Capillary: 148 mg/dL — ABNORMAL HIGH (ref 65–99)
Glucose-Capillary: 187 mg/dL — ABNORMAL HIGH (ref 65–99)
Glucose-Capillary: 121 mg/dL — ABNORMAL HIGH (ref 65–99)
Glucose-Capillary: 225 mg/dL — ABNORMAL HIGH (ref 65–99)

## 2016-05-12 LAB — BASIC METABOLIC PANEL
ANION GAP: 11 (ref 5–15)
BUN: 13 mg/dL (ref 6–20)
CHLORIDE: 105 mmol/L (ref 101–111)
CO2: 22 mmol/L (ref 22–32)
Calcium: 9.4 mg/dL (ref 8.9–10.3)
Creatinine, Ser: 0.89 mg/dL (ref 0.61–1.24)
GFR calc non Af Amer: >60 mL/min (ref >60)
Glucose, Bld: 123 mg/dL — ABNORMAL HIGH (ref 65–99)
Potassium: 4.6 mmol/L (ref 3.5–5.1)
SODIUM: 138 mmol/L (ref 135–145)

## 2016-05-12 LAB — POCT I-STAT 3, ART BLOOD GAS (G3+)
BICARBONATE: 24.7 meq/L — AB (ref 20.0–24.0)
O2 SAT: 95 %
TCO2: 26 mmol/L (ref 0–100)
pCO2 arterial: 40.4 mmHg (ref 35.0–45.0)
pH, Arterial: 7.393 (ref 7.350–7.450)
pO2, Arterial: 76 mmHg — ABNORMAL LOW (ref 80.0–100.0)

## 2016-05-12 LAB — ABO/RH: ABO/RH(D): A NEG

## 2016-05-12 LAB — PREPARE RBC (CROSSMATCH)

## 2016-05-12 LAB — HEPARIN LEVEL (UNFRACTIONATED)
Heparin Unfractionated: 0.79 IU/mL — ABNORMAL HIGH (ref 0.30–0.70)
Heparin Unfractionated: 0.54 IU/mL (ref 0.30–0.70)

## 2016-05-12 MED ORDER — CHLORHEXIDINE GLUCONATE 0.12 % MT SOLN
15.0000 mL | Freq: Once | OROMUCOSAL | Status: AC
  Administered 2016-05-13: 15 mL via OROMUCOSAL
  Filled 2016-05-12: qty 15

## 2016-05-12 MED ORDER — ZOLPIDEM TARTRATE 5 MG PO TABS
10.0000 mg | ORAL_TABLET | Freq: Every evening | ORAL | Status: DC | PRN
  Administered 2016-05-14 – 2016-05-16 (×3): 10 mg via ORAL
  Filled 2016-05-12 (×3): qty 2

## 2016-05-12 MED ORDER — DIAZEPAM 5 MG PO TABS
5.0000 mg | ORAL_TABLET | Freq: Once | ORAL | Status: AC
  Administered 2016-05-13: 5 mg via ORAL
  Filled 2016-05-12: qty 1

## 2016-05-12 MED ORDER — MAGNESIUM SULFATE 50 % IJ SOLN
40.0000 meq | INTRAMUSCULAR | Status: DC
  Filled 2016-05-12: qty 10

## 2016-05-12 MED ORDER — INSULIN STARTER KIT- PEN NEEDLES (ENGLISH)
1.0000 | Freq: Once | Status: DC
  Filled 2016-05-12: qty 1

## 2016-05-12 MED ORDER — EPINEPHRINE HCL 1 MG/ML IJ SOLN
0.0000 ug/min | INTRAVENOUS | Status: DC
  Filled 2016-05-12: qty 4

## 2016-05-12 MED ORDER — METOPROLOL TARTRATE 12.5 MG HALF TABLET
12.5000 mg | ORAL_TABLET | Freq: Once | ORAL | Status: AC
  Administered 2016-05-13: 12.5 mg via ORAL
  Filled 2016-05-12: qty 1

## 2016-05-12 MED ORDER — PLASMA-LYTE 148 IV SOLN
INTRAVENOUS | Status: AC
  Administered 2016-05-13: 500 mL
  Filled 2016-05-12: qty 2.5

## 2016-05-12 MED ORDER — CHLORHEXIDINE GLUCONATE 4 % EX LIQD
60.0000 mL | Freq: Once | CUTANEOUS | Status: AC
  Administered 2016-05-13: 4 via TOPICAL
  Filled 2016-05-12: qty 60

## 2016-05-12 MED ORDER — DEXMEDETOMIDINE HCL IN NACL 400 MCG/100ML IV SOLN
0.1000 ug/kg/h | INTRAVENOUS | Status: AC
  Administered 2016-05-13: .2 ug/kg/h via INTRAVENOUS
  Filled 2016-05-12: qty 100

## 2016-05-12 MED ORDER — ALPRAZOLAM 0.25 MG PO TABS: 0.2500 mg | ORAL_TABLET | ORAL | Status: DC | PRN

## 2016-05-12 MED ORDER — AMINOCAPROIC ACID 250 MG/ML IV SOLN
INTRAVENOUS | Status: AC
  Administered 2016-05-13: 69.8 mL/h via INTRAVENOUS
  Filled 2016-05-12: qty 40

## 2016-05-12 MED ORDER — BISACODYL 5 MG PO TBEC: 5.0000 mg | DELAYED_RELEASE_TABLET | Freq: Once | ORAL | Status: DC

## 2016-05-12 MED ORDER — VANCOMYCIN HCL 10 G IV SOLR
1250.0000 mg | INTRAVENOUS | Status: AC
  Administered 2016-05-13: 1250 mg via INTRAVENOUS
  Filled 2016-05-12: qty 1250

## 2016-05-12 MED ORDER — DOPAMINE-DEXTROSE 3.2-5 MG/ML-% IV SOLN
0.0000 ug/kg/min | INTRAVENOUS | Status: DC
  Filled 2016-05-12: qty 250

## 2016-05-12 MED ORDER — TEMAZEPAM 15 MG PO CAPS: 15.0000 mg | ORAL_CAPSULE | Freq: Once | ORAL | Status: DC | PRN

## 2016-05-12 MED ORDER — DEXTROSE 5 % IV SOLN
750.0000 mg | INTRAVENOUS | Status: DC
  Filled 2016-05-12: qty 750

## 2016-05-12 MED ORDER — POTASSIUM CHLORIDE 2 MEQ/ML IV SOLN
80.0000 meq | INTRAVENOUS | Status: DC
  Filled 2016-05-12: qty 40

## 2016-05-12 MED ORDER — SODIUM CHLORIDE 0.9 % IV SOLN
INTRAVENOUS | Status: AC
  Administered 2016-05-13: 1.4 [IU]/h via INTRAVENOUS
  Filled 2016-05-12: qty 2.5

## 2016-05-12 MED ORDER — SODIUM CHLORIDE 0.9 % IV SOLN
INTRAVENOUS | Status: DC
  Filled 2016-05-12: qty 30

## 2016-05-12 MED ORDER — DEXTROSE 5 % IV SOLN
1.5000 g | INTRAVENOUS | Status: AC
  Administered 2016-05-13: 1.5 g via INTRAVENOUS
  Administered 2016-05-13: .75 g via INTRAVENOUS
  Filled 2016-05-12: qty 1.5

## 2016-05-12 MED ORDER — NITROGLYCERIN IN D5W 200-5 MCG/ML-% IV SOLN
2.0000 ug/min | INTRAVENOUS | Status: AC
  Administered 2016-05-13: 5 ug/min via INTRAVENOUS
  Filled 2016-05-12: qty 250

## 2016-05-12 MED ORDER — PHENYLEPHRINE HCL 10 MG/ML IJ SOLN
30.0000 ug/min | INTRAVENOUS | Status: AC
  Administered 2016-05-13: 30 ug/min via INTRAVENOUS
  Filled 2016-05-12: qty 2

## 2016-05-12 MED ORDER — CHLORHEXIDINE GLUCONATE 4 % EX LIQD
60.0000 mL | Freq: Once | CUTANEOUS | Status: AC
  Administered 2016-05-12: 4 via TOPICAL
  Filled 2016-05-12: qty 60

## 2016-05-12 NOTE — Progress Notes (Signed)
ANTICOAGULATION CONSULT NOTE Pharmacy Consult for Heparin  Indication: chest pain/ACS  No Known Allergies  Patient Measurements: Height: 5\' 9"  (175.3 cm) Weight: 201 lb 11.5 oz (91.5 kg) IBW/kg (Calculated) : 70.7  Vital Signs: Temp: 98.3 F (36.8 C) (06/11 0816) Temp Source: Oral (06/11 0816) BP: 113/73 mmHg (06/11 0816)  Labs:  Recent Labs  05/09/16 1327  05/09/16 2121 05/10/16 0305 05/10/16 1105 05/11/16 0227 05/11/16 1131 05/12/16 0319 05/12/16 1050  HGB 16.3  --   --  14.7  --  15.2  --  15.0  --   HCT 46.2  --   --  42.4  --  43.5  --  43.4  --   PLT 153  --   --  138*  --  155  --  136*  --   HEPARINUNFRC  --   < >  --  0.16* 0.19* 0.39 0.66 0.79* 0.54  CREATININE 1.00  --   --   --   --   --   --   --   --   TROPONINI  --   < > 0.09* 0.13* 0.07*  --   --   --   --   < > = values in this interval not displayed.  Estimated Creatinine Clearance: 103.1 mL/min (by C-G formula based on Cr of 1).   Assessment: [age] y.o. male with CAD awaiting CABG for heparin. HL now therapeutic.   Goal of Therapy:  Heparin level 0.3-0.7 units/ml Monitor platelets by anticoagulation protocol: Yes   Plan:  Continue Heparin 1500 units/hr AM HL Daily HL and CBC  Sherron MondayAubrey N. Reynald Woods, PharmD Clinical Pharmacy Resident Pager: [phone] 05/12/2016 12:19 PM

## 2016-05-12 NOTE — Anesthesia Preprocedure Evaluation (Addendum)
Anesthesia Evaluation  Patient identified by MRN, date of birth, ID band Patient awake    Reviewed: Allergy & Precautions, NPO status , Patient's Chart, lab work & pertinent test results, reviewed documented beta blocker date and time   Airway Mallampati: II  TM Distance: >3 FB Neck ROM: Full    Dental  (+) Dental Advisory Given   Pulmonary Current Smoker,    breath sounds clear to auscultation       Cardiovascular hypertension, + CAD and + Past MI   Rhythm:Regular Rate:Normal  Nl EF. Valves okay   Neuro/Psych negative neurological ROS     GI/Hepatic negative GI ROS, Neg liver ROS,   Endo/Other  diabetes  Renal/GU negative Renal ROS     Musculoskeletal   Abdominal   Peds  Hematology negative hematology ROS (+)   Anesthesia Other Findings   Reproductive/Obstetrics                            Lab Results  Component Value Date   WBC 8.9 05/12/2016   HGB 15.0 05/12/2016   HCT 43.4 05/12/2016   MCV 88.8 05/12/2016   PLT 136* 05/12/2016   Lab Results  Component Value Date   CREATININE 0.89 05/12/2016   BUN 13 05/12/2016   NA 138 05/12/2016   K 4.6 05/12/2016   CL 105 05/12/2016   CO2 22 05/12/2016    Anesthesia Physical Anesthesia Plan  ASA: IV  Anesthesia Plan: General   Post-op Pain Management:    Induction: Intravenous  Airway Management Planned: Oral ETT  Additional Equipment: Arterial line, PA Cath, CVP, TEE and Ultrasound Guidance Line Placement  Intra-op Plan:   Post-operative Plan: Post-operative intubation/ventilation  Informed Consent:   Plan Discussed with:   Anesthesia Plan Comments:         Anesthesia Quick Evaluation

## 2016-05-12 NOTE — Progress Notes (Signed)
PROGRESS NOTE    [name]  KTG:[id number] DOB: 27-Mar-1971 DOA: 05/09/2016 PCP: Mickie Hillier, MD    Brief Narrative:  45 y.o. male with a Past Medical History of DM, HTN, Tobacco use who presents with CP. CArdiology on board and pt is s/p left heart cath and coronary angiography. Cath reporting severe 3 vCAD   Assessment & Plan:   Principal Problem:   Chest pain/ NSTEMI (non-ST elevated myocardial infarction) [facility]) - Cardiology on board and managing. They have consulted the Cardiothoracic surgeon as patient will require bypass surgery. Please refer to cardiology notes for details. - Pt for CABG on monday  Active Problems:   Diabetes mellitus type II, uncontrolled ([place]) - SSI - diabetic diet    Essential hypertension - on ACEI and B blocker    Tobacco abuse - nicotine patch in place - pt motivated to quit smoking   DVT prophylaxis: heparin gtt Code Status: full Family Communication: d/c pt directly Disposition Plan: CABG monday   Consultants:   Cardiology   Procedures: Please refer to EMR and cardiology notes   Antimicrobials: None   Subjective: Patient has no new complaints.  Objective: Filed Vitals:   05/11/16 2337 05/12/16 0420 05/12/16 0816 05/12/16 1247  BP: 128/91 105/66 113/73 116/75  Pulse:      Temp: 98.1 F (36.7 C) 97.9 F (36.6 C) 98.3 F (36.8 C) 97.6 F (36.4 C)  TempSrc: Oral Oral Oral Oral  Resp: '16 16 16 16  ' Height:      Weight:      SpO2: 97% 98% 98% 97%    Intake/Output Summary (Last 24 hours) at 05/12/16 1320 Last data filed at 05/12/16 0600  Gross per 24 hour  Intake 625.53 ml  Output      0 ml  Net 625.53 ml   Filed Weights   05/09/16 2011 05/10/16 0634 05/10/16 1634  Weight: 91.309 kg (201 lb 4.8 oz) 91.536 kg (201 lb 12.8 oz) 91.5 kg (201 lb 11.5 oz)    Examination:  General exam: Appears calm and comfortable  Respiratory system: Clear to auscultation. Respiratory effort normal. Cardiovascular system: No  cyanosis Gastrointestinal system: Abdomen is nondistended, soft and nontender.  Central nervous system: Alert and oriented. No focal neurological deficits. Extremities: Symmetric 5 x 5 power. Skin: No rashes, lesions or ulcers Psychiatry: Judgement and insight appear normal. Mood & affect appropriate.     Data Reviewed: I have personally reviewed following labs and imaging studies  CBC:  Recent Labs Lab 05/08/16 0931 05/09/16 1327 05/10/16 0305 05/11/16 0227 05/12/16 0319  WBC 8.5 8.1 7.7 8.0 8.9  HGB 16.7 16.3 14.7 15.2 15.0  HCT 45.5 46.2 42.4 43.5 43.4  MCV 87.8 86.7 87.2 87.7 88.8  PLT 154 153 138* 155 428*   Basic Metabolic Panel:  Recent Labs Lab 05/08/16 0931 05/09/16 1327  NA 136 135  K 3.5 4.2  CL 102 101  CO2 27 25  GLUCOSE 318* 295*  BUN 14 11  CREATININE 0.87 1.00  CALCIUM 9.1 9.2  MG 1.8  --    GFR: Estimated Creatinine Clearance: 103.1 mL/min (by C-G formula based on Cr of 1). Liver Function Tests: No results for input(s): AST, ALT, ALKPHOS, BILITOT, PROT, ALBUMIN in the last 168 hours. No results for input(s): LIPASE, AMYLASE in the last 168 hours. No results for input(s): AMMONIA in the last 168 hours. Coagulation Profile:  Recent Labs Lab 05/08/16 0931  INR 0.97   Cardiac Enzymes:  Recent Labs Lab  05/09/16 0302 05/09/16 1533 05/09/16 2121 05/10/16 0305 05/10/16 1105  TROPONINI 0.17* 0.10* 0.09* 0.13* 0.07*   BNP (last 3 results) No results for input(s): PROBNP in the last 8760 hours. HbA1C:  Recent Labs  05/09/16 1327  HGBA1C 10.2*   CBG:  Recent Labs Lab 05/11/16 1137 05/11/16 1627 05/11/16 2135 05/12/16 0816 05/12/16 1249  GLUCAP 230* 144* 202* 148* 187*   Lipid Profile:  Recent Labs  05/10/16 0305  CHOL 239*  HDL 26*  LDLCALC UNABLE TO CALCULATE IF TRIGLYCERIDE OVER 400 mg/dL  TRIG 427*  CHOLHDL 9.2   Thyroid Function Tests: No results for input(s): TSH, T4TOTAL, FREET4, T3FREE, THYROIDAB in the last  72 hours. Anemia Panel: No results for input(s): VITAMINB12, FOLATE, FERRITIN, TIBC, IRON, RETICCTPCT in the last 72 hours. Sepsis Labs: No results for input(s): PROCALCITON, LATICACIDVEN in the last 168 hours.  Recent Results (from the past 240 hour(s))  MRSA PCR Screening     Status: None   Collection Time: 05/10/16  4:44 PM  Result Value Ref Range Status   MRSA by PCR NEGATIVE NEGATIVE Final    Comment:        The GeneXpert MRSA Assay (FDA approved for NASAL specimens only), is one component of a comprehensive MRSA colonization surveillance program. It is not intended to diagnose MRSA infection nor to guide or monitor treatment for MRSA infections.          Radiology Studies: No results found.      Scheduled Meds: . [START ON 05/13/2016] aminocaproic acid (AMICAR) for OHS   Intravenous To OR  . aspirin  81 mg Oral Daily  . atorvastatin  80 mg Oral q1800  . bisacodyl  5 mg Oral Once  . carvedilol  12.5 mg Oral BID WC  . [START ON 05/13/2016] cefUROXime (ZINACEF)  IV  1.5 g Intravenous To OR  . [START ON 05/13/2016] cefUROXime (ZINACEF)  IV  750 mg Intravenous To OR  . chlorhexidine  60 mL Topical Once   And  . [START ON 05/13/2016] chlorhexidine  60 mL Topical Once  . [START ON 05/13/2016] chlorhexidine  15 mL Mouth/Throat Once  . [START ON 05/13/2016] dexmedetomidine  0.1-0.7 mcg/kg/hr Intravenous To OR  . [START ON 05/13/2016] diazepam  5 mg Oral Once  . [START ON 05/13/2016] DOPamine  0-10 mcg/kg/min Intravenous To OR  . [START ON 05/13/2016] epinephrine  0-10 mcg/min Intravenous To OR  . [START ON 05/13/2016] heparin-papaverine-plasmalyte irrigation   Irrigation To OR  . [START ON 05/13/2016] heparin 30,000 units/NS 1000 mL solution for CELLSAVER   Other To OR  . insulin aspart  0-5 Units Subcutaneous QHS  . insulin aspart  0-9 Units Subcutaneous TID WC  . insulin glargine  14 Units Subcutaneous BID  . [START ON 05/13/2016] insulin (NOVOLIN-R) infusion   Intravenous  To OR  . [START ON 05/13/2016] insulin starter kit- pen needles  1 kit Other Once  . lisinopril  10 mg Oral Daily  . [START ON 05/13/2016] magnesium sulfate  40 mEq Other To OR  . [START ON 05/13/2016] metoprolol tartrate  12.5 mg Oral Once  . nicotine  21 mg Transdermal Daily  . [START ON 05/13/2016] nitroGLYCERIN  2-200 mcg/min Intravenous To OR  . [START ON 05/13/2016] phenylephrine (NEO-SYNEPHRINE) Adult infusion  30-200 mcg/min Intravenous To OR  . [START ON 05/13/2016] potassium chloride  80 mEq Other To OR  . regadenoson  0.4 mg Intravenous Once  . sodium chloride flush  3 mL Intravenous  Q12H  . [START ON 05/13/2016] vancomycin  1,250 mg Intravenous To OR   Continuous Infusions: . heparin 1,500 Units/hr (05/12/16 0600)     LOS: 2 days    Time spent: > 20 minutes    Velvet Bathe, MD [facility] Pager [phone]  If 7PM-7AM, please contact night-coverage [email or web address] Password [email or web address] 05/12/2016, 1:20 PM

## 2016-05-12 NOTE — Progress Notes (Signed)
Sat and talked with patient about his diabetes management and did some education on carbohydrate moderate/ heart healthy diet. Patient shared that he was diagnosed in 2008 with diabetes and did well with taking care of it with diet and oral medications. Patient stated he lost his job in 2009 and he did not have the money for the medications and just let everything go in regards to taking care of diabetes. Patient shared that his mother was diabetic and he is familiar with insulin. He also reported that he has already been educated on the insulin pens but would like a refresher closer to him going home. He feels like there has been a lot of overwhelming information given thus far. Patient was able to verbalize items he should be eating including the lean proteins. Pt receptive to education and asked appropriate questions. Will continue to support, educate, and monitor.

## 2016-05-12 NOTE — Progress Notes (Signed)
Subjective:   Cath with severe 3vCAD (05/10/16) normal EF.  Denies CP or dyspnea.     Intake/Output Summary (Last 24 hours) at 05/12/16 1357 Last data filed at 05/12/16 0600  Gross per 24 hour  Intake 625.53 ml  Output      0 ml  Net 625.53 ml    Current meds: . [START ON 05/13/2016] aminocaproic acid (AMICAR) for OHS   Intravenous To OR  . aspirin  81 mg Oral Daily  . atorvastatin  80 mg Oral q1800  . bisacodyl  5 mg Oral Once  . carvedilol  12.5 mg Oral BID WC  . [START ON 05/13/2016] cefUROXime (ZINACEF)  IV  1.5 g Intravenous To OR  . [START ON 05/13/2016] cefUROXime (ZINACEF)  IV  750 mg Intravenous To OR  . chlorhexidine  60 mL Topical Once   And  . [START ON 05/13/2016] chlorhexidine  60 mL Topical Once  . [START ON 05/13/2016] chlorhexidine  15 mL Mouth/Throat Once  . [START ON 05/13/2016] dexmedetomidine  0.1-0.7 mcg/kg/hr Intravenous To OR  . [START ON 05/13/2016] diazepam  5 mg Oral Once  . [START ON 05/13/2016] DOPamine  0-10 mcg/kg/min Intravenous To OR  . [START ON 05/13/2016] epinephrine  0-10 mcg/min Intravenous To OR  . [START ON 05/13/2016] heparin-papaverine-plasmalyte irrigation   Irrigation To OR  . [START ON 05/13/2016] heparin 30,000 units/NS 1000 mL solution for CELLSAVER   Other To OR  . insulin aspart  0-5 Units Subcutaneous QHS  . insulin aspart  0-9 Units Subcutaneous TID WC  . insulin glargine  14 Units Subcutaneous BID  . [START ON 05/13/2016] insulin (NOVOLIN-R) infusion   Intravenous To OR  . [START ON 05/13/2016] insulin starter kit- pen needles  1 kit Other Once  . lisinopril  10 mg Oral Daily  . [START ON 05/13/2016] magnesium sulfate  40 mEq Other To OR  . [START ON 05/13/2016] metoprolol tartrate  12.5 mg Oral Once  . nicotine  21 mg Transdermal Daily  . [START ON 05/13/2016] nitroGLYCERIN  2-200 mcg/min Intravenous To OR  . [START ON 05/13/2016] phenylephrine (NEO-SYNEPHRINE) Adult infusion  30-200 mcg/min Intravenous To OR  . [START ON 05/13/2016]  potassium chloride  80 mEq Other To OR  . regadenoson  0.4 mg Intravenous Once  . sodium chloride flush  3 mL Intravenous Q12H  . [START ON 05/13/2016] vancomycin  1,250 mg Intravenous To OR   Infusions: . heparin 1,500 Units/hr (05/12/16 0600)     Objective:  Blood pressure 116/75, pulse 81, temperature 97.6 F (36.4 C), temperature source Oral, resp. rate 16, height _0  (1.753 m), weight 91.5 kg (201 lb 11.5 oz), SpO2 97 %. Weight change:   Physical Exam: General:  Sitting in chair  No resp difficulty HEENT: normal Neck: supple. JVP flat . Carotids 2+ bilat; no bruits. No lymphadenopathy or thryomegaly appreciated. Cor: PMI nondisplaced. Regular rate & rhythm. No rubs, gallops or murmurs. Lungs: clear but decreased BS Abdomen: soft, nontender, nondistended. No hepatosplenomegaly. No bruits or masses. Good bowel sounds. Extremities: no cyanosis, clubbing, rash, edema Neuro: alert & orientedx3, cranial nerves grossly intact. moves all 4 extremities w/o difficulty. Affect pleasant  Telemetry: NSR   Lab Results: Basic Metabolic Panel:  Recent Labs Lab 05/08/16 0931 05/09/16 1327  NA 136 135  K 3.5 4.2  CL 102 101  CO2 27 25  GLUCOSE 318* 295*  BUN 14 11  CREATININE 0.87 1.00  CALCIUM 9.1 9.2  MG 1.8  --  Liver Function Tests: No results for input(s): AST, ALT, ALKPHOS, BILITOT, PROT, ALBUMIN in the last 168 hours. No results for input(s): LIPASE, AMYLASE in the last 168 hours. No results for input(s): AMMONIA in the last 168 hours. CBC:  Recent Labs Lab 05/08/16 0931 05/09/16 1327 05/10/16 0305 05/11/16 0227 05/12/16 0319  WBC 8.5 8.1 7.7 8.0 8.9  HGB 16.7 16.3 14.7 15.2 15.0  HCT 45.5 46.2 42.4 43.5 43.4  MCV 87.8 86.7 87.2 87.7 88.8  PLT 154 153 138* 155 136*   Cardiac Enzymes:  Recent Labs Lab 05/09/16 0302 05/09/16 1533 05/09/16 2121 05/10/16 0305 05/10/16 1105  TROPONINI 0.17* 0.10* 0.09* 0.13* 0.07*   BNP: Invalid input(s):  POCBNP CBG:  Recent Labs Lab 05/11/16 1137 05/11/16 1627 05/11/16 2135 05/12/16 0816 05/12/16 1249  GLUCAP 230* 144* 202* 148* 187*   Microbiology: No results found for: CULT No results for input(s): CULT, SDES in the last 168 hours.  Imaging: No results found.   ASSESSMENT:  1. NSTEMI 2. 3v CAD with normal EF 3. DM2, poorly controlled 4. Hyperlipidemia/hypertriglyderidemia 5. Tobacco use 6. Metabolic syndrome 7. Left carotid stenosis. 40-59%  PLAN/DISCUSSION:  He has severe 3v CAD.  EF normal. No further CP. Continue heparin, ASA, statin, bb-blocker. Dr. Montez Hageman has seen. CABG in am.   CBGs improved. Continue insulin and SSI.   No BMET since 6/8. Will recheck.   Continue nicotine patch.     LOS: 2 days  Glori Bickers, MD 05/12/2016, 1:57 PM

## 2016-05-12 NOTE — Progress Notes (Signed)
2 Days Post-Op Procedure(s) (LRB): Left Heart Cath and Coronary Angiography (N/A) Subjective: Severe 3 vessel CAD with unstable angina No chest pain while on IV heparin Pre-CABG Dopplers reviewed and are satisfactory Plan surgery in a.m. Procedure benefits and risks have been discussed with patient  Objective: Vital signs in last 24 hours: Temp:  [97.6 F (36.4 C)-98.3 F (36.8 C)] 98.1 F (36.7 C) (06/11 1650) Cardiac Rhythm:  [-] Normal sinus rhythm (06/11 1200) Resp:  [16-18] 16 (06/11 1650) BP: (105-128)/(66-91) 124/86 mmHg (06/11 1650) SpO2:  [97 %-100 %] 97 % (06/11 1650)  Hemodynamic parameters for last 24 hours:  stable  Intake/Output from previous day: 06/10 0701 - 06/11 0700 In: 1167.5 [P.O.:780; I.V.:387.5] Out: 300 [Urine:300] Intake/Output this shift: Total I/O In: 120 [I.V.:120] Out: -        Exam    General- alert and comfortable   Lungs- clear without rales, wheezes   Cor- regular rate and rhythm, no murmur , gallop   Abdomen- soft, non-tender   Extremities - warm, non-tender, minimal edema   Neuro- oriented, appropriate, no focal weakness   Lab Results:  Recent Labs  05/11/16 0227 05/12/16 0319  WBC 8.0 8.9  HGB 15.2 15.0  HCT 43.5 43.4  PLT 155 136*   BMET:  Recent Labs  05/12/16 1512  NA 138  K 4.6  CL 105  CO2 22  GLUCOSE 123*  BUN 13  CREATININE 0.89  CALCIUM 9.4    PT/INR: No results for input(s): LABPROT, INR in the last 72 hours. ABG No results found for: PHART, HCO3, TCO2, ACIDBASEDEF, O2SAT CBG (last 3)   Recent Labs  05/12/16 0816 05/12/16 1249 05/12/16 1652  GLUCAP 148* 187* 121*    Assessment/Plan: S/P Procedure(s) (LRB): Left Heart Cath and Coronary Angiography (N/A) CABG in a.m.   LOS: 2 days    [name] 05/12/2016

## 2016-05-12 NOTE — Progress Notes (Signed)
ANTICOAGULATION CONSULT NOTE Pharmacy Consult for Heparin  Indication: chest pain/ACS  No Known Allergies  Patient Measurements: Height: 5\' 9"  (175.3 cm) Weight: 201 lb 11.5 oz (91.5 kg) IBW/kg (Calculated) : 70.7  Vital Signs: Temp: 98.1 F (36.7 C) (06/10 2337) Temp Source: Oral (06/10 2337) BP: 128/91 mmHg (06/10 2337) Pulse Rate: 81 (06/10 1735)  Labs:  Recent Labs  05/09/16 1327  05/09/16 2121 05/10/16 0305 05/10/16 1105 05/11/16 0227 05/11/16 1131 05/12/16 0319  HGB 16.3  --   --  14.7  --  15.2  --  15.0  HCT 46.2  --   --  42.4  --  43.5  --  43.4  PLT 153  --   --  138*  --  155  --  136*  HEPARINUNFRC  --   < >  --  0.16* 0.19* 0.39 0.66 0.79*  CREATININE 1.00  --   --   --   --   --   --   --   TROPONINI  --   < > 0.09* 0.13* 0.07*  --   --   --   < > = values in this interval not displayed.  Estimated Creatinine Clearance: 103.1 mL/min (by C-G formula based on Cr of 1).   Assessment: 45 y.o. male with CAD awaiting CABG for heparin  Goal of Therapy:  Heparin level 0.3-0.7 units/ml Monitor platelets by anticoagulation protocol: Yes   Plan:  Decrease Heparin 1500 units/hr  Geannie RisenGreg Yolandra Habig, PharmD, BCPS   05/12/2016 4:15 AM

## 2016-05-13 ENCOUNTER — Inpatient Hospital Stay (HOSPITAL_COMMUNITY): Payer: BLUE CROSS/BLUE SHIELD

## 2016-05-13 ENCOUNTER — Encounter (HOSPITAL_COMMUNITY): Payer: Self-pay | Admitting: Interventional Cardiology

## 2016-05-13 ENCOUNTER — Inpatient Hospital Stay (HOSPITAL_COMMUNITY): Payer: BLUE CROSS/BLUE SHIELD | Admitting: Certified Registered"

## 2016-05-13 ENCOUNTER — Encounter (HOSPITAL_COMMUNITY)
Admission: EM | Disposition: A | Discharge status: Home / Self Care | Payer: Self-pay | Source: Home / Self Care | Attending: Cardiothoracic Surgery

## 2016-05-13 DIAGNOSIS — Z951 Presence of aortocoronary bypass graft: Secondary | ICD-10-CM

## 2016-05-13 HISTORY — PX: CORONARY ARTERY BYPASS GRAFT: SHX141

## 2016-05-13 HISTORY — PX: TEE WITHOUT CARDIOVERSION: SHX5443

## 2016-05-13 LAB — POCT I-STAT, CHEM 8
BUN: 13 mg/dL (ref 6–20)
BUN: 11 mg/dL (ref 6–20)
BUN: 13 mg/dL (ref 6–20)
BUN: 11 mg/dL (ref 6–20)
BUN: 11 mg/dL (ref 6–20)
BUN: 10 mg/dL (ref 6–20)
CALCIUM ION: 1.26 mmol/L — AB (ref 1.12–1.23)
CALCIUM ION: 1.01 mmol/L — AB (ref 1.12–1.23)
CALCIUM ION: 1.23 mmol/L (ref 1.12–1.23)
CALCIUM ION: 1.01 mmol/L — AB (ref 1.12–1.23)
CALCIUM ION: 1.03 mmol/L — AB (ref 1.12–1.23)
CHLORIDE: 102 mmol/L (ref 101–111)
CREATININE: 0.6 mg/dL — AB (ref 0.61–1.24)
CREATININE: 0.5 mg/dL — AB (ref 0.61–1.24)
CREATININE: 0.6 mg/dL — AB (ref 0.61–1.24)
CREATININE: 0.7 mg/dL (ref 0.61–1.24)
Calcium, Ion: 1.16 mmol/L (ref 1.12–1.23)
Chloride: 102 mmol/L (ref 101–111)
Chloride: 98 mmol/L — ABNORMAL LOW (ref 101–111)
Chloride: 101 mmol/L (ref 101–111)
Chloride: 100 mmol/L — ABNORMAL LOW (ref 101–111)
Chloride: 103 mmol/L (ref 101–111)
Creatinine, Ser: 0.6 mg/dL — ABNORMAL LOW (ref 0.61–1.24)
Creatinine, Ser: 0.6 mg/dL — ABNORMAL LOW (ref 0.61–1.24)
GLUCOSE: 131 mg/dL — AB (ref 65–99)
GLUCOSE: 125 mg/dL — AB (ref 65–99)
GLUCOSE: 143 mg/dL — AB (ref 65–99)
GLUCOSE: 124 mg/dL — AB (ref 65–99)
Glucose, Bld: 195 mg/dL — ABNORMAL HIGH (ref 65–99)
Glucose, Bld: 104 mg/dL — ABNORMAL HIGH (ref 65–99)
HCT: 40 % (ref 39.0–52.0)
HCT: 29 % — ABNORMAL LOW (ref 39.0–52.0)
HCT: 28 % — ABNORMAL LOW (ref 39.0–52.0)
HEMATOCRIT: 38 % — AB (ref 39.0–52.0)
HEMATOCRIT: 27 % — AB (ref 39.0–52.0)
HEMATOCRIT: 38 % — AB (ref 39.0–52.0)
HEMOGLOBIN: 9.9 g/dL — AB (ref 13.0–17.0)
HEMOGLOBIN: 9.5 g/dL — AB (ref 13.0–17.0)
HEMOGLOBIN: 12.9 g/dL — AB (ref 13.0–17.0)
Hemoglobin: 13.6 g/dL (ref 13.0–17.0)
Hemoglobin: 12.9 g/dL — ABNORMAL LOW (ref 13.0–17.0)
Hemoglobin: 9.2 g/dL — ABNORMAL LOW (ref 13.0–17.0)
POTASSIUM: 3.6 mmol/L (ref 3.5–5.1)
POTASSIUM: 4.4 mmol/L (ref 3.5–5.1)
Potassium: 3.6 mmol/L (ref 3.5–5.1)
Potassium: 3.8 mmol/L (ref 3.5–5.1)
Potassium: 3.9 mmol/L (ref 3.5–5.1)
Potassium: 3.8 mmol/L (ref 3.5–5.1)
SODIUM: 138 mmol/L (ref 135–145)
SODIUM: 136 mmol/L (ref 135–145)
SODIUM: 143 mmol/L (ref 135–145)
Sodium: 140 mmol/L (ref 135–145)
Sodium: 138 mmol/L (ref 135–145)
Sodium: 139 mmol/L (ref 135–145)
TCO2: 29 mmol/L (ref 0–100)
TCO2: 27 mmol/L (ref 0–100)
TCO2: 28 mmol/L (ref 0–100)
TCO2: 27 mmol/L (ref 0–100)
TCO2: 27 mmol/L (ref 0–100)
TCO2: 25 mmol/L (ref 0–100)

## 2016-05-13 LAB — HEMOGLOBIN AND HEMATOCRIT, BLOOD
HCT: 28.6 % — ABNORMAL LOW (ref 39.0–52.0)
Hemoglobin: 9.9 g/dL — ABNORMAL LOW (ref 13.0–17.0)

## 2016-05-13 LAB — BASIC METABOLIC PANEL
Anion gap: 8 (ref 5–15)
BUN: 13 mg/dL (ref 6–20)
CO2: 28 mmol/L (ref 22–32)
Calcium: 9.2 mg/dL (ref 8.9–10.3)
Chloride: 104 mmol/L (ref 101–111)
Creatinine, Ser: 0.92 mg/dL (ref 0.61–1.24)
GFR calc Af Amer: >60 mL/min (ref >60)
GFR calc non Af Amer: >60 mL/min (ref >60)
Glucose, Bld: 124 mg/dL — ABNORMAL HIGH (ref 65–99)
Potassium: 3.8 mmol/L (ref 3.5–5.1)
Sodium: 140 mmol/L (ref 135–145)

## 2016-05-13 LAB — PROTIME-INR
INR: 1.09 (ref 0.00–1.49)
INR: 1.27 (ref 0.00–1.49)
Prothrombin Time: 14.2 seconds (ref 11.6–15.2)
Prothrombin Time: 16.1 seconds — ABNORMAL HIGH (ref 11.6–15.2)

## 2016-05-13 LAB — POCT I-STAT 3, ART BLOOD GAS (G3+)
ACID-BASE DEFICIT: 1 mmol/L (ref 0.0–2.0)
ACID-BASE DEFICIT: 4 mmol/L — AB (ref 0.0–2.0)
ACID-BASE DEFICIT: 2 mmol/L (ref 0.0–2.0)
BICARBONATE: 25.9 meq/L — AB (ref 20.0–24.0)
BICARBONATE: 24.9 meq/L — AB (ref 20.0–24.0)
Bicarbonate: 24.9 mEq/L — ABNORMAL HIGH (ref 20.0–24.0)
Bicarbonate: 22.6 mEq/L (ref 20.0–24.0)
Bicarbonate: 24.1 mEq/L — ABNORMAL HIGH (ref 20.0–24.0)
O2 SAT: 100 %
O2 SAT: 97 %
O2 Saturation: 100 %
O2 Saturation: 99 %
O2 Saturation: 98 %
PCO2 ART: 44.2 mmHg (ref 35.0–45.0)
PCO2 ART: 38.1 mmHg (ref 35.0–45.0)
PH ART: 7.409 (ref 7.350–7.450)
PO2 ART: 270 mmHg — AB (ref 80.0–100.0)
PO2 ART: 136 mmHg — AB (ref 80.0–100.0)
Patient temperature: 36.8
TCO2: 27 mmol/L (ref 0–100)
TCO2: 26 mmol/L (ref 0–100)
TCO2: 24 mmol/L (ref 0–100)
TCO2: 25 mmol/L (ref 0–100)
TCO2: 26 mmol/L (ref 0–100)
pCO2 arterial: 44.3 mmHg (ref 35.0–45.0)
pCO2 arterial: 47.7 mmHg — ABNORMAL HIGH (ref 35.0–45.0)
pCO2 arterial: 48.9 mmHg — ABNORMAL HIGH (ref 35.0–45.0)
pH, Arterial: 7.374 (ref 7.350–7.450)
pH, Arterial: 7.325 — ABNORMAL LOW (ref 7.350–7.450)
pH, Arterial: 7.312 — ABNORMAL LOW (ref 7.350–7.450)
pH, Arterial: 7.314 — ABNORMAL LOW (ref 7.350–7.450)
pO2, Arterial: 315 mmHg — ABNORMAL HIGH (ref 80.0–100.0)
pO2, Arterial: 92 mmHg (ref 80.0–100.0)
pO2, Arterial: 125 mmHg — ABNORMAL HIGH (ref 80.0–100.0)

## 2016-05-13 LAB — POCT I-STAT 4, (NA,K, GLUC, HGB,HCT)
GLUCOSE: 163 mg/dL — AB (ref 65–99)
HEMATOCRIT: 34 % — AB (ref 39.0–52.0)
Hemoglobin: 11.6 g/dL — ABNORMAL LOW (ref 13.0–17.0)
POTASSIUM: 3.4 mmol/L — AB (ref 3.5–5.1)
SODIUM: 141 mmol/L (ref 135–145)

## 2016-05-13 LAB — GLUCOSE, CAPILLARY
GLUCOSE-CAPILLARY: 121 mg/dL — AB (ref 65–99)
GLUCOSE-CAPILLARY: 109 mg/dL — AB (ref 65–99)
GLUCOSE-CAPILLARY: 113 mg/dL — AB (ref 65–99)
GLUCOSE-CAPILLARY: 120 mg/dL — AB (ref 65–99)
GLUCOSE-CAPILLARY: 101 mg/dL — AB (ref 65–99)
Glucose-Capillary: 139 mg/dL — ABNORMAL HIGH (ref 65–99)
Glucose-Capillary: 155 mg/dL — ABNORMAL HIGH (ref 65–99)

## 2016-05-13 LAB — CBC
HCT: 34.9 % — ABNORMAL LOW (ref 39.0–52.0)
HEMATOCRIT: 41.3 % (ref 39.0–52.0)
HEMATOCRIT: 36.8 % — AB (ref 39.0–52.0)
HEMOGLOBIN: 12.8 g/dL — AB (ref 13.0–17.0)
Hemoglobin: 14.2 g/dL (ref 13.0–17.0)
Hemoglobin: 12.3 g/dL — ABNORMAL LOW (ref 13.0–17.0)
MCH: 30.3 pg (ref 26.0–34.0)
MCH: 30 pg (ref 26.0–34.0)
MCH: 30.4 pg (ref 26.0–34.0)
MCHC: 34.4 g/dL (ref 30.0–36.0)
MCHC: 34.8 g/dL (ref 30.0–36.0)
MCHC: 35.2 g/dL (ref 30.0–36.0)
MCV: 88.2 fL (ref 78.0–100.0)
MCV: 86.2 fL (ref 78.0–100.0)
MCV: 86.4 fL (ref 78.0–100.0)
Platelets: 144 10*3/uL — ABNORMAL LOW (ref 150–400)
Platelets: 121 10*3/uL — ABNORMAL LOW (ref 150–400)
Platelets: 140 10*3/uL — ABNORMAL LOW (ref 150–400)
RBC: 4.68 MIL/uL (ref 4.22–5.81)
RBC: 4.27 MIL/uL (ref 4.22–5.81)
RBC: 4.04 MIL/uL — ABNORMAL LOW (ref 4.22–5.81)
RDW: 12.3 % (ref 11.5–15.5)
RDW: 12 % (ref 11.5–15.5)
RDW: 12 % (ref 11.5–15.5)
WBC: 9.7 10*3/uL (ref 4.0–10.5)
WBC: 13.7 10*3/uL — AB (ref 4.0–10.5)
WBC: 14.1 10*3/uL — ABNORMAL HIGH (ref 4.0–10.5)

## 2016-05-13 LAB — APTT
APTT: 31 s (ref 24–37)
aPTT: 94 seconds — ABNORMAL HIGH (ref 24–37)

## 2016-05-13 LAB — CREATININE, SERUM
Creatinine, Ser: 0.77 mg/dL (ref 0.61–1.24)
GFR calc Af Amer: >60 mL/min (ref >60)
GFR calc non Af Amer: >60 mL/min (ref >60)

## 2016-05-13 LAB — SURGICAL PCR SCREEN
MRSA, PCR: NEGATIVE
Staphylococcus aureus: NEGATIVE

## 2016-05-13 LAB — MAGNESIUM: Magnesium: 2.8 mg/dL — ABNORMAL HIGH (ref 1.7–2.4)

## 2016-05-13 LAB — HEPARIN LEVEL (UNFRACTIONATED): Heparin Unfractionated: 0.49 IU/mL (ref 0.30–0.70)

## 2016-05-13 LAB — PLATELET COUNT: Platelets: 80 10*3/uL — ABNORMAL LOW (ref 150–400)

## 2016-05-13 SURGERY — CORONARY ARTERY BYPASS GRAFTING (CABG)
Anesthesia: General | Site: Chest

## 2016-05-13 MED ORDER — SODIUM CHLORIDE 0.9 % IV SOLN
INTRAVENOUS | Status: AC
  Administered 2016-05-13: 2.5 [IU]/h via INTRAVENOUS
  Filled 2016-05-13 (×2): qty 2.5

## 2016-05-13 MED ORDER — CETYLPYRIDINIUM CHLORIDE 0.05 % MT LIQD
7.0000 mL | Freq: Two times a day (BID) | OROMUCOSAL | Status: DC
  Administered 2016-05-13 – 2016-05-14 (×2): 7 mL via OROMUCOSAL

## 2016-05-13 MED ORDER — BISACODYL 10 MG RE SUPP
10.0000 mg | Freq: Every day | RECTAL | Status: DC
  Filled 2016-05-13 (×2): qty 1

## 2016-05-13 MED ORDER — DOPAMINE-DEXTROSE 3.2-5 MG/ML-% IV SOLN: 0.0000 ug/kg/min | INTRAVENOUS | Status: DC

## 2016-05-13 MED ORDER — NITROGLYCERIN IN D5W 200-5 MCG/ML-% IV SOLN: 0.0000 ug/min | INTRAVENOUS | Status: DC

## 2016-05-13 MED ORDER — METOCLOPRAMIDE HCL 5 MG/ML IJ SOLN
10.0000 mg | Freq: Four times a day (QID) | INTRAMUSCULAR | Status: DC
  Administered 2016-05-13: 10 mg via INTRAVENOUS

## 2016-05-13 MED ORDER — VANCOMYCIN HCL IN DEXTROSE 1-5 GM/200ML-% IV SOLN
1000.0000 mg | Freq: Two times a day (BID) | INTRAVENOUS | Status: AC
  Administered 2016-05-13 – 2016-05-14 (×3): 1000 mg via INTRAVENOUS
  Filled 2016-05-13 (×3): qty 200

## 2016-05-13 MED ORDER — DEXTROSE 5 % IV SOLN
0.0000 ug/min | INTRAVENOUS | Status: DC
  Filled 2016-05-13 (×3): qty 1

## 2016-05-13 MED ORDER — SODIUM CHLORIDE 0.9 % IV SOLN: 250.0000 mL | INTRAVENOUS | Status: DC

## 2016-05-13 MED ORDER — DOCUSATE SODIUM 100 MG PO CAPS
200.0000 mg | ORAL_CAPSULE | Freq: Every day | ORAL | Status: DC
  Administered 2016-05-14 – 2016-05-17 (×3): 200 mg via ORAL
  Filled 2016-05-13 (×3): qty 2

## 2016-05-13 MED ORDER — LIDOCAINE HCL (CARDIAC) 20 MG/ML IV SOLN
INTRAVENOUS | Status: DC | PRN
  Administered 2016-05-13: 100 mg via INTRAVENOUS

## 2016-05-13 MED ORDER — MIDAZOLAM HCL 5 MG/ML IJ SOLN
INTRAMUSCULAR | Status: DC | PRN
  Administered 2016-05-13 (×5): 2 mg via INTRAVENOUS

## 2016-05-13 MED ORDER — DEXTROSE 5 % IV SOLN
1.5000 g | Freq: Two times a day (BID) | INTRAVENOUS | Status: DC
  Filled 2016-05-13: qty 1.5

## 2016-05-13 MED ORDER — FENTANYL CITRATE (PF) 250 MCG/5ML IJ SOLN
INTRAMUSCULAR | Status: DC | PRN
  Administered 2016-05-13 (×3): 100 ug via INTRAVENOUS
  Administered 2016-05-13: 50 ug via INTRAVENOUS
  Administered 2016-05-13: 150 ug via INTRAVENOUS
  Administered 2016-05-13: 50 ug via INTRAVENOUS
  Administered 2016-05-13: 100 ug via INTRAVENOUS
  Administered 2016-05-13: 150 ug via INTRAVENOUS
  Administered 2016-05-13 (×3): 100 ug via INTRAVENOUS
  Administered 2016-05-13: 50 ug via INTRAVENOUS
  Administered 2016-05-13: 500 ug via INTRAVENOUS
  Administered 2016-05-13: 100 ug via INTRAVENOUS

## 2016-05-13 MED ORDER — POTASSIUM CHLORIDE 10 MEQ/50ML IV SOLN
10.0000 meq | Freq: Once | INTRAVENOUS | Status: AC
  Administered 2016-05-13: 10 meq via INTRAVENOUS

## 2016-05-13 MED ORDER — METOCLOPRAMIDE HCL 5 MG/ML IJ SOLN
10.0000 mg | Freq: Four times a day (QID) | INTRAMUSCULAR | Status: AC
  Administered 2016-05-13 – 2016-05-15 (×6): 10 mg via INTRAVENOUS
  Filled 2016-05-13 (×8): qty 2

## 2016-05-13 MED ORDER — LIDOCAINE 2% (20 MG/ML) 5 ML SYRINGE
INTRAMUSCULAR | Status: AC
  Filled 2016-05-13: qty 5

## 2016-05-13 MED ORDER — VECURONIUM BROMIDE 10 MG IV SOLR
INTRAVENOUS | Status: DC | PRN
  Administered 2016-05-13: 2 mg via INTRAVENOUS
  Administered 2016-05-13 (×3): 5 mg via INTRAVENOUS

## 2016-05-13 MED ORDER — VECURONIUM BROMIDE 10 MG IV SOLR
INTRAVENOUS | Status: AC
  Filled 2016-05-13: qty 10

## 2016-05-13 MED ORDER — PROPOFOL 10 MG/ML IV BOLUS
INTRAVENOUS | Status: DC | PRN
  Administered 2016-05-13 (×3): 20 mg via INTRAVENOUS
  Administered 2016-05-13: 30 mg via INTRAVENOUS

## 2016-05-13 MED ORDER — METOPROLOL TARTRATE 12.5 MG HALF TABLET
12.5000 mg | ORAL_TABLET | Freq: Two times a day (BID) | ORAL | Status: DC
  Administered 2016-05-14 – 2016-05-17 (×7): 12.5 mg via ORAL
  Filled 2016-05-13 (×7): qty 1

## 2016-05-13 MED ORDER — ASPIRIN 81 MG PO CHEW
324.0000 mg | CHEWABLE_TABLET | Freq: Every day | ORAL | Status: DC
Start: 2016-05-14 — End: 2016-05-17
  Filled 2016-05-13 (×2): qty 4

## 2016-05-13 MED ORDER — BISACODYL 5 MG PO TBEC
10.0000 mg | DELAYED_RELEASE_TABLET | Freq: Every day | ORAL | Status: DC
  Administered 2016-05-14 – 2016-05-17 (×2): 10 mg via ORAL
  Filled 2016-05-13 (×3): qty 2

## 2016-05-13 MED ORDER — FAMOTIDINE IN NACL 20-0.9 MG/50ML-% IV SOLN
20.0000 mg | Freq: Two times a day (BID) | INTRAVENOUS | Status: AC
  Administered 2016-05-13 (×2): 20 mg via INTRAVENOUS
  Filled 2016-05-13: qty 50

## 2016-05-13 MED ORDER — LACTATED RINGERS IV SOLN
INTRAVENOUS | Status: DC | PRN
  Administered 2016-05-13 (×4): via INTRAVENOUS

## 2016-05-13 MED ORDER — HEPARIN SODIUM (PORCINE) 1000 UNIT/ML IJ SOLN
INTRAMUSCULAR | Status: DC | PRN
  Administered 2016-05-13: 3000 [IU] via INTRAVENOUS
  Administered 2016-05-13: 25000 [IU] via INTRAVENOUS

## 2016-05-13 MED ORDER — FENTANYL CITRATE (PF) 250 MCG/5ML IJ SOLN
INTRAMUSCULAR | Status: AC
  Filled 2016-05-13: qty 5

## 2016-05-13 MED ORDER — ONDANSETRON HCL 4 MG/2ML IJ SOLN
4.0000 mg | Freq: Four times a day (QID) | INTRAMUSCULAR | Status: DC | PRN
  Administered 2016-05-13 – 2016-05-15 (×4): 4 mg via INTRAVENOUS
  Filled 2016-05-13 (×4): qty 2

## 2016-05-13 MED ORDER — ACETAMINOPHEN 160 MG/5ML PO SOLN: 650.0000 mg | Freq: Once | ORAL | Status: AC

## 2016-05-13 MED ORDER — ALBUMIN HUMAN 5 % IV SOLN
250.0000 mL | INTRAVENOUS | Status: AC | PRN
  Administered 2016-05-13 (×4): 250 mL via INTRAVENOUS
  Filled 2016-05-13 (×2): qty 250

## 2016-05-13 MED ORDER — EPHEDRINE 5 MG/ML INJ
INTRAVENOUS | Status: AC
  Filled 2016-05-13: qty 10

## 2016-05-13 MED ORDER — LACTATED RINGERS IV SOLN: INTRAVENOUS | Status: DC

## 2016-05-13 MED ORDER — SODIUM CHLORIDE 0.9 % IJ SOLN
OROMUCOSAL | Status: DC | PRN
  Administered 2016-05-13: 8 mL via TOPICAL
  Administered 2016-05-13: 4 mL via TOPICAL

## 2016-05-13 MED ORDER — ARTIFICIAL TEARS OP OINT
TOPICAL_OINTMENT | OPHTHALMIC | Status: AC
  Filled 2016-05-13: qty 3.5

## 2016-05-13 MED ORDER — 0.9 % SODIUM CHLORIDE (POUR BTL) OPTIME
TOPICAL | Status: DC | PRN
  Administered 2016-05-13: 5000 mL

## 2016-05-13 MED ORDER — PANTOPRAZOLE SODIUM 40 MG PO TBEC
40.0000 mg | DELAYED_RELEASE_TABLET | Freq: Every day | ORAL | Status: DC
  Administered 2016-05-15 – 2016-05-17 (×3): 40 mg via ORAL
  Filled 2016-05-13 (×3): qty 1

## 2016-05-13 MED ORDER — HEMOSTATIC AGENTS (NO CHARGE) OPTIME
TOPICAL | Status: DC | PRN
  Administered 2016-05-13: 1 via TOPICAL

## 2016-05-13 MED ORDER — POTASSIUM CHLORIDE 10 MEQ/50ML IV SOLN
10.0000 meq | INTRAVENOUS | Status: AC
  Administered 2016-05-13 (×3): 10 meq via INTRAVENOUS

## 2016-05-13 MED ORDER — HEPARIN SODIUM (PORCINE) 1000 UNIT/ML IJ SOLN
INTRAMUSCULAR | Status: AC
  Filled 2016-05-13: qty 1

## 2016-05-13 MED ORDER — 0.9 % SODIUM CHLORIDE (POUR BTL) OPTIME
TOPICAL | Status: DC | PRN
  Administered 2016-05-13: 1000 mL

## 2016-05-13 MED ORDER — LACTATED RINGERS IV SOLN
500.0000 mL | Freq: Once | INTRAVENOUS | Status: DC | PRN
Start: 2016-05-13 — End: 2016-05-13

## 2016-05-13 MED ORDER — AMIODARONE HCL IN DEXTROSE 360-4.14 MG/200ML-% IV SOLN
30.0000 mg/h | INTRAVENOUS | Status: DC
  Administered 2016-05-13: 30 mg/h via INTRAVENOUS
  Filled 2016-05-13 (×2): qty 200

## 2016-05-13 MED ORDER — SODIUM CHLORIDE 0.9% FLUSH: 3.0000 mL | INTRAVENOUS | Status: DC | PRN

## 2016-05-13 MED ORDER — CHLORHEXIDINE GLUCONATE 0.12% ORAL RINSE (MEDLINE KIT)
15.0000 mL | Freq: Two times a day (BID) | OROMUCOSAL | Status: DC
  Administered 2016-05-13: 15 mL via OROMUCOSAL

## 2016-05-13 MED ORDER — SODIUM CHLORIDE 0.9 % IV SOLN: INTRAVENOUS | Status: DC

## 2016-05-13 MED ORDER — ROCURONIUM BROMIDE 100 MG/10ML IV SOLN
INTRAVENOUS | Status: DC | PRN
  Administered 2016-05-13: 30 mg via INTRAVENOUS
  Administered 2016-05-13: 70 mg via INTRAVENOUS

## 2016-05-13 MED ORDER — METOPROLOL TARTRATE 5 MG/5ML IV SOLN: 2.5000 mg | INTRAVENOUS | Status: DC | PRN

## 2016-05-13 MED ORDER — ACETAMINOPHEN 650 MG RE SUPP
650.0000 mg | Freq: Once | RECTAL | Status: AC
  Administered 2016-05-13: 650 mg via RECTAL

## 2016-05-13 MED ORDER — SODIUM CHLORIDE 0.45 % IV SOLN
INTRAVENOUS | Status: DC | PRN
Start: 2016-05-13 — End: 2016-05-15

## 2016-05-13 MED ORDER — MORPHINE SULFATE (PF) 2 MG/ML IV SOLN
1.0000 mg | INTRAVENOUS | Status: DC | PRN
  Administered 2016-05-13: 2 mg via INTRAVENOUS

## 2016-05-13 MED ORDER — ANTISEPTIC ORAL RINSE SOLUTION (CORINZ): 7.0000 mL | Freq: Four times a day (QID) | OROMUCOSAL | Status: DC

## 2016-05-13 MED ORDER — TRAMADOL HCL 50 MG PO TABS
50.0000 mg | ORAL_TABLET | ORAL | Status: DC | PRN
  Administered 2016-05-15: 100 mg via ORAL
  Filled 2016-05-13: qty 2

## 2016-05-13 MED ORDER — METOPROLOL TARTRATE 25 MG/10 ML ORAL SUSPENSION
12.5000 mg | Freq: Two times a day (BID) | ORAL | Status: DC
  Filled 2016-05-13: qty 10

## 2016-05-13 MED ORDER — MORPHINE SULFATE (PF) 2 MG/ML IV SOLN
2.0000 mg | INTRAVENOUS | Status: DC | PRN
  Filled 2016-05-13: qty 2

## 2016-05-13 MED ORDER — ACETAMINOPHEN 500 MG PO TABS
1000.0000 mg | ORAL_TABLET | Freq: Four times a day (QID) | ORAL | Status: DC
  Administered 2016-05-13 – 2016-05-17 (×13): 1000 mg via ORAL
  Filled 2016-05-13 (×13): qty 2

## 2016-05-13 MED ORDER — VANCOMYCIN HCL IN DEXTROSE 1-5 GM/200ML-% IV SOLN
1000.0000 mg | Freq: Once | INTRAVENOUS | Status: DC
  Filled 2016-05-13: qty 200

## 2016-05-13 MED ORDER — DOPAMINE-DEXTROSE 3.2-5 MG/ML-% IV SOLN
INTRAVENOUS | Status: DC | PRN
  Administered 2016-05-13: 3 ug/kg/min via INTRAVENOUS

## 2016-05-13 MED ORDER — PHENYLEPHRINE HCL 10 MG/ML IJ SOLN
INTRAMUSCULAR | Status: DC | PRN
  Administered 2016-05-13: 40 ug via INTRAVENOUS
  Administered 2016-05-13: 120 ug via INTRAVENOUS
  Administered 2016-05-13: 80 ug via INTRAVENOUS

## 2016-05-13 MED ORDER — ASPIRIN EC 325 MG PO TBEC
325.0000 mg | DELAYED_RELEASE_TABLET | Freq: Every day | ORAL | Status: DC
  Administered 2016-05-14 – 2016-05-17 (×4): 325 mg via ORAL
  Filled 2016-05-13 (×4): qty 1

## 2016-05-13 MED ORDER — ARTIFICIAL TEARS OP OINT
TOPICAL_OINTMENT | OPHTHALMIC | Status: DC | PRN
  Administered 2016-05-13: 1 via OPHTHALMIC

## 2016-05-13 MED ORDER — PROTAMINE SULFATE 10 MG/ML IV SOLN
INTRAVENOUS | Status: DC | PRN
  Administered 2016-05-13 (×2): 5 mg via INTRAVENOUS
  Administered 2016-05-13: 10 mg via INTRAVENOUS
  Administered 2016-05-13: 5 mg via INTRAVENOUS

## 2016-05-13 MED ORDER — AMIODARONE HCL IN DEXTROSE 360-4.14 MG/200ML-% IV SOLN
60.0000 mg/h | INTRAVENOUS | Status: AC
  Administered 2016-05-13: 60 mg/h via INTRAVENOUS
  Filled 2016-05-13: qty 200

## 2016-05-13 MED ORDER — PROTAMINE SULFATE 10 MG/ML IV SOLN
INTRAVENOUS | Status: AC
  Filled 2016-05-13: qty 25

## 2016-05-13 MED ORDER — ACETAMINOPHEN 160 MG/5ML PO SOLN: 1000.0000 mg | Freq: Four times a day (QID) | ORAL | Status: DC

## 2016-05-13 MED ORDER — DEXMEDETOMIDINE HCL IN NACL 200 MCG/50ML IV SOLN
0.0000 ug/kg/h | INTRAVENOUS | Status: DC
  Filled 2016-05-13: qty 50

## 2016-05-13 MED ORDER — SUCCINYLCHOLINE CHLORIDE 200 MG/10ML IV SOSY
PREFILLED_SYRINGE | INTRAVENOUS | Status: AC
  Filled 2016-05-13: qty 10

## 2016-05-13 MED ORDER — BUDESONIDE 0.5 MG/2ML IN SUSP
0.5000 mg | Freq: Two times a day (BID) | RESPIRATORY_TRACT | Status: DC
  Administered 2016-05-13 – 2016-05-16 (×7): 0.5 mg via RESPIRATORY_TRACT
  Filled 2016-05-13 (×8): qty 2

## 2016-05-13 MED ORDER — INSULIN REGULAR BOLUS VIA INFUSION
0.0000 [IU] | Freq: Three times a day (TID) | INTRAVENOUS | Status: AC
  Filled 2016-05-13: qty 10

## 2016-05-13 MED ORDER — MIDAZOLAM HCL 2 MG/2ML IJ SOLN
2.0000 mg | INTRAMUSCULAR | Status: DC | PRN
  Administered 2016-05-13: 2 mg via INTRAVENOUS
  Filled 2016-05-13: qty 2

## 2016-05-13 MED ORDER — FENTANYL CITRATE (PF) 250 MCG/5ML IJ SOLN
INTRAMUSCULAR | Status: AC
  Filled 2016-05-13: qty 25

## 2016-05-13 MED ORDER — CHLORHEXIDINE GLUCONATE 0.12 % MT SOLN
15.0000 mL | OROMUCOSAL | Status: AC
  Administered 2016-05-13: 15 mL via OROMUCOSAL

## 2016-05-13 MED ORDER — MAGNESIUM SULFATE 4 GM/100ML IV SOLN
4.0000 g | Freq: Once | INTRAVENOUS | Status: AC
  Administered 2016-05-13: 4 g via INTRAVENOUS
  Filled 2016-05-13: qty 100

## 2016-05-13 MED ORDER — MIDAZOLAM HCL 10 MG/2ML IJ SOLN
INTRAMUSCULAR | Status: AC
  Filled 2016-05-13: qty 2

## 2016-05-13 MED ORDER — SODIUM CHLORIDE 0.9% FLUSH
3.0000 mL | Freq: Two times a day (BID) | INTRAVENOUS | Status: DC
  Administered 2016-05-14 – 2016-05-16 (×4): 3 mL via INTRAVENOUS

## 2016-05-13 MED ORDER — LEVALBUTEROL HCL 1.25 MG/0.5ML IN NEBU
1.2500 mg | INHALATION_SOLUTION | Freq: Four times a day (QID) | RESPIRATORY_TRACT | Status: DC
  Administered 2016-05-13 – 2016-05-15 (×8): 1.25 mg via RESPIRATORY_TRACT
  Filled 2016-05-13 (×9): qty 0.5

## 2016-05-13 MED ORDER — KETOROLAC TROMETHAMINE 15 MG/ML IJ SOLN
15.0000 mg | Freq: Four times a day (QID) | INTRAMUSCULAR | Status: AC
  Administered 2016-05-13 – 2016-05-14 (×4): 15 mg via INTRAVENOUS
  Filled 2016-05-13 (×4): qty 1

## 2016-05-13 MED ORDER — OXYCODONE HCL 5 MG PO TABS
5.0000 mg | ORAL_TABLET | ORAL | Status: DC | PRN
  Administered 2016-05-13: 5 mg via ORAL
  Administered 2016-05-14 – 2016-05-16 (×6): 10 mg via ORAL
  Administered 2016-05-16 – 2016-05-17 (×2): 5 mg via ORAL
  Filled 2016-05-13 (×3): qty 2
  Filled 2016-05-13: qty 1
  Filled 2016-05-13 (×4): qty 2
  Filled 2016-05-13: qty 1
  Filled 2016-05-13 (×2): qty 2

## 2016-05-13 MED ORDER — DEXTROSE 5 % IV SOLN
1.5000 g | Freq: Two times a day (BID) | INTRAVENOUS | Status: AC
  Administered 2016-05-13 – 2016-05-15 (×4): 1.5 g via INTRAVENOUS
  Filled 2016-05-13 (×4): qty 1.5

## 2016-05-13 MED FILL — Heparin Sodium (Porcine) Inj 1000 Unit/ML: INTRAMUSCULAR | Qty: 30 | Status: AC

## 2016-05-13 MED FILL — Potassium Chloride Inj 2 mEq/ML: INTRAVENOUS | Qty: 40 | Status: AC

## 2016-05-13 MED FILL — Magnesium Sulfate Inj 50%: INTRAMUSCULAR | Qty: 10 | Status: AC

## 2016-05-13 SURGICAL SUPPLY — 101 items
ADAPTER CARDIO PERF ANTE/RETRO (ADAPTER) ×4 IMPLANT
BAG DECANTER FOR FLEXI CONT (MISCELLANEOUS) ×4 IMPLANT
BANDAGE ACE 4X5 VEL STRL LF (GAUZE/BANDAGES/DRESSINGS) ×4 IMPLANT
BANDAGE ACE 6X5 VEL STRL LF (GAUZE/BANDAGES/DRESSINGS) ×4 IMPLANT
BASKET HEART  (ORDER IN 25'S) (MISCELLANEOUS) ×1
BASKET HEART (ORDER IN 25'S) (MISCELLANEOUS) ×1
BASKET HEART (ORDER IN 25S) (MISCELLANEOUS) ×2 IMPLANT
BENZOIN TINCTURE PRP APPL 2/3 (GAUZE/BANDAGES/DRESSINGS) ×4 IMPLANT
BLADE 11 SAFETY STRL DISP (BLADE) ×4 IMPLANT
BLADE STERNUM SYSTEM 6 (BLADE) ×4 IMPLANT
BLADE SURG 12 STRL SS (BLADE) ×4 IMPLANT
BLADE SURG ROTATE 9660 (MISCELLANEOUS) IMPLANT
BNDG GAUZE ELAST 4 BULKY (GAUZE/BANDAGES/DRESSINGS) ×4 IMPLANT
CANISTER SUCTION 2500CC (MISCELLANEOUS) ×4 IMPLANT
CANNULA GUNDRY RCSP 15FR (MISCELLANEOUS) ×4 IMPLANT
CATH CPB KIT VANTRIGT (MISCELLANEOUS) ×4 IMPLANT
CATH ROBINSON RED A/P 18FR (CATHETERS) ×12 IMPLANT
CATH THORACIC 36FR RT ANG (CATHETERS) ×4 IMPLANT
CLIP FOGARTY SPRING 6M (CLIP) ×4 IMPLANT
CLIP TI WIDE RED SMALL 24 (CLIP) ×8 IMPLANT
COVER SURGICAL LIGHT HANDLE (MISCELLANEOUS) ×4 IMPLANT
CRADLE DONUT ADULT HEAD (MISCELLANEOUS) ×4 IMPLANT
DERMABOND ADVANCED (GAUZE/BANDAGES/DRESSINGS) ×2
DERMABOND ADVANCED .7 DNX12 (GAUZE/BANDAGES/DRESSINGS) ×2 IMPLANT
DRAIN CHANNEL 32F RND 10.7 FF (WOUND CARE) ×4 IMPLANT
DRAPE CARDIOVASCULAR INCISE (DRAPES) ×2
DRAPE SLUSH/WARMER DISC (DRAPES) ×4 IMPLANT
DRAPE SRG 135X102X78XABS (DRAPES) ×2 IMPLANT
DRSG AQUACEL AG ADV 3.5X14 (GAUZE/BANDAGES/DRESSINGS) ×4 IMPLANT
ELECT BLADE 4.0 EZ CLEAN MEGAD (MISCELLANEOUS) ×4
ELECT BLADE 6.5 EXT (BLADE) ×4 IMPLANT
ELECT CAUTERY BLADE 6.4 (BLADE) ×4 IMPLANT
ELECT REM PT RETURN 9FT ADLT (ELECTROSURGICAL) ×8
ELECTRODE BLDE 4.0 EZ CLN MEGD (MISCELLANEOUS) ×2 IMPLANT
ELECTRODE REM PT RTRN 9FT ADLT (ELECTROSURGICAL) ×4 IMPLANT
FELT TEFLON 1X6 (MISCELLANEOUS) ×4 IMPLANT
GAUZE SPONGE 4X4 12PLY STRL (GAUZE/BANDAGES/DRESSINGS) ×8 IMPLANT
GLOVE BIO SURGEON STRL SZ 6.5 (GLOVE) ×18 IMPLANT
GLOVE BIO SURGEON STRL SZ7.5 (GLOVE) ×12 IMPLANT
GLOVE BIO SURGEONS STRL SZ 6.5 (GLOVE) ×6
GLOVE BIOGEL PI IND STRL 6.5 (GLOVE) ×12 IMPLANT
GLOVE BIOGEL PI INDICATOR 6.5 (GLOVE) ×12
GOWN STRL REUS W/ TWL LRG LVL3 (GOWN DISPOSABLE) ×8 IMPLANT
GOWN STRL REUS W/TWL LRG LVL3 (GOWN DISPOSABLE) ×8
HEMOSTAT POWDER SURGIFOAM 1G (HEMOSTASIS) ×12 IMPLANT
HEMOSTAT SURGICEL 2X14 (HEMOSTASIS) ×4 IMPLANT
INSERT FOGARTY XLG (MISCELLANEOUS) IMPLANT
KIT BASIN OR (CUSTOM PROCEDURE TRAY) ×4 IMPLANT
KIT ROOM TURNOVER OR (KITS) ×4 IMPLANT
KIT SUCTION CATH 14FR (SUCTIONS) ×8 IMPLANT
KIT VASOVIEW 6 PRO VH 2400 (KITS) ×4 IMPLANT
LEAD PACING MYOCARDI (MISCELLANEOUS) ×4 IMPLANT
MARKER GRAFT CORONARY BYPASS (MISCELLANEOUS) ×12 IMPLANT
NS IRRIG 1000ML POUR BTL (IV SOLUTION) ×20 IMPLANT
PACK OPEN HEART (CUSTOM PROCEDURE TRAY) ×4 IMPLANT
PAD ARMBOARD 7.5X6 YLW CONV (MISCELLANEOUS) ×8 IMPLANT
PAD ELECT DEFIB RADIOL ZOLL (MISCELLANEOUS) ×4 IMPLANT
PENCIL BUTTON HOLSTER BLD 10FT (ELECTRODE) ×4 IMPLANT
PUNCH AORTIC ROTATE  4.5MM 8IN (MISCELLANEOUS) ×4 IMPLANT
SET CARDIOPLEGIA MPS 5001102 (MISCELLANEOUS) ×4 IMPLANT
SOLUTION ANTI FOG 6CC (MISCELLANEOUS) ×4 IMPLANT
SPONGE LAP 18X18 X RAY DECT (DISPOSABLE) ×12 IMPLANT
SPONGE LAP 4X18 X RAY DECT (DISPOSABLE) ×4 IMPLANT
SURGIFLO W/THROMBIN 8M KIT (HEMOSTASIS) ×4 IMPLANT
SUT BONE WAX W31G (SUTURE) ×4 IMPLANT
SUT MNCRL AB 4-0 PS2 18 (SUTURE) ×4 IMPLANT
SUT PROLENE 3 0 SH DA (SUTURE) IMPLANT
SUT PROLENE 3 0 SH1 36 (SUTURE) IMPLANT
SUT PROLENE 4 0 RB 1 (SUTURE) ×4
SUT PROLENE 4 0 SH DA (SUTURE) ×4 IMPLANT
SUT PROLENE 4-0 RB1 .5 CRCL 36 (SUTURE) ×4 IMPLANT
SUT PROLENE 5 0 C 1 36 (SUTURE) IMPLANT
SUT PROLENE 6 0 C 1 30 (SUTURE) IMPLANT
SUT PROLENE 6 0 CC (SUTURE) ×12 IMPLANT
SUT PROLENE 8 0 BV175 6 (SUTURE) IMPLANT
SUT PROLENE BLUE 7 0 (SUTURE) ×8 IMPLANT
SUT SILK  1 MH (SUTURE)
SUT SILK 1 MH (SUTURE) IMPLANT
SUT SILK 2 0 SH CR/8 (SUTURE) IMPLANT
SUT SILK 3 0 SH CR/8 (SUTURE) IMPLANT
SUT STEEL 6MS V (SUTURE) ×4 IMPLANT
SUT STEEL SZ 6 DBL 3X14 BALL (SUTURE) ×4 IMPLANT
SUT VIC AB 1 CTX 36 (SUTURE) ×4
SUT VIC AB 1 CTX36XBRD ANBCTR (SUTURE) ×4 IMPLANT
SUT VIC AB 2-0 CT1 27 (SUTURE) ×2
SUT VIC AB 2-0 CT1 TAPERPNT 27 (SUTURE) ×2 IMPLANT
SUT VIC AB 2-0 CTX 27 (SUTURE) IMPLANT
SUT VIC AB 3-0 X1 27 (SUTURE) IMPLANT
SUTURE E-PAK OPEN HEART (SUTURE) ×4 IMPLANT
SYSTEM SAHARA CHEST DRAIN ATS (WOUND CARE) ×4 IMPLANT
TAPE CLOTH SURG 6X10 WHT LF (GAUZE/BANDAGES/DRESSINGS) ×4 IMPLANT
TAPE PAPER 2X10 WHT MICROPORE (GAUZE/BANDAGES/DRESSINGS) ×8 IMPLANT
TOWEL OR 17X24 6PK STRL BLUE (TOWEL DISPOSABLE) ×8 IMPLANT
TOWEL OR 17X26 10 PK STRL BLUE (TOWEL DISPOSABLE) ×8 IMPLANT
TRAY FOLEY IC TEMP SENS 16FR (CATHETERS) ×4 IMPLANT
TRAY FOLEY SILVER 14FR TEMP (SET/KITS/TRAYS/PACK) ×4 IMPLANT
TUBE CONNECTING 12'X1/4 (SUCTIONS) ×2
TUBE CONNECTING 12X1/4 (SUCTIONS) ×6 IMPLANT
TUBING INSUFFLATION (TUBING) ×4 IMPLANT
UNDERPAD 30X30 INCONTINENT (UNDERPADS AND DIAPERS) ×4 IMPLANT
WATER STERILE IRR 1000ML POUR (IV SOLUTION) ×8 IMPLANT

## 2016-05-13 NOTE — Procedures (Signed)
Extubation Procedure Note  Patient Details:   Name: Octaviano Glowhilip W Kozloski DOB: 01/05/1971 MRN: [id number]   Airway Documentation:     Evaluation  O2 sats: stable throughout Complications: No apparent complications Patient did tolerate procedure well. Bilateral Breath Sounds: Clear   Yes   Pt extubated to 4L N/C.  No stridor noted.  RN at bedside.  NIF greater than -20, VC ~1200 mL.  Christophe LouisSteven D Odaliz Mcqueary 05/13/2016, 6:19 PM

## 2016-05-13 NOTE — Progress Notes (Signed)
The patient was examined and preop studies reviewed. There has been no change from the prior exam and the patient is ready for surgery.  plan CABG on [name] for unstable angina

## 2016-05-13 NOTE — Progress Notes (Signed)
  Echocardiogram Echocardiogram Transesophageal has been performed.  Janalyn HarderWest, Murrel Freet R 05/13/2016, 8:58 AM

## 2016-05-13 NOTE — Progress Notes (Signed)
Patient ID: [name], male   DOB: 06/14/1971, 45 y.o.   MRN: [id number]   SICU Evening Rounds:   Hemodynamically stable  CI = 2.5 on dop 2  Just extubated.  Urine output good  CT output low  CBC    Component Value Date/Time   WBC 13.7* 05/13/2016 1345   RBC 4.27 05/13/2016 1345   HGB 12.8* 05/13/2016 1345   HCT 36.8* 05/13/2016 1345   PLT 121* 05/13/2016 1345   MCV 86.2 05/13/2016 1345   MCH 30.0 05/13/2016 1345   MCHC 34.8 05/13/2016 1345   RDW 12.0 05/13/2016 1345     BMET    Component Value Date/Time   NA 141 05/13/2016 1344   K 3.4* 05/13/2016 1344   CL 100* 05/13/2016 1232   CO2 28 05/13/2016 0313   GLUCOSE 163* 05/13/2016 1344   BUN 11 05/13/2016 1232   CREATININE 0.60* 05/13/2016 1232   CALCIUM 9.2 05/13/2016 0313   GFRNONAA >60 05/13/2016 0313   GFRAA >60 05/13/2016 0313     A/P:  Stable postop course. Continue current plans

## 2016-05-13 NOTE — Progress Notes (Signed)
Notified MD Bartle of patient's PAD reading 3-5. No new orders at this time. Will continue to monitor patient.

## 2016-05-13 NOTE — Progress Notes (Signed)
Transferred pt to OR. Report given to CRNA assigned to pt. V/S stable. No c/o any  chest pain or  Discomfort. Heparin D/C'd as ordered. Pre-op meds. Given.

## 2016-05-13 NOTE — Brief Op Note (Signed)
05/09/2016 - 05/13/2016  11:37 AM  PATIENT:  [name]  45 y.o. male  PRE-OPERATIVE DIAGNOSIS:  CAD  POST-OPERATIVE DIAGNOSIS:  CAD  PROCEDURE:  Procedure(s) with comments:  CORONARY ARTERY BYPASS GRAFTING x 4 -LIMA to LAD -SVG to DIAGONAL -SVG to OM -SVG to PDA  ENDOSCOPIC HARVEST GREATER SAPHENOUS VEIN  -Right Leg  TRANSESOPHAGEAL ECHOCARDIOGRAM (TEE) (N/A)  SURGEON:  Surgeon(s) and Role:    * Kerin PernaPeter Van Trigt, MD - Primary  PHYSICIAN ASSISTANT: Lowella DandyErin Calisa Luckenbaugh PA-C  ANESTHESIA:   general  EBL:  Total I/O In: 1000 [I.V.:1000] Out: 660 [Urine:660]  BLOOD ADMINISTERED: CELLSAVER  DRAINS: Left Pleural Chest Tube, Mediastinal Chest Drains   LOCAL MEDICATIONS USED:  NONE  SPECIMEN:  No Specimen  DISPOSITION OF SPECIMEN:  N/A  COUNTS:  YES  TOURNIQUET:  * No tourniquets in log *  DICTATION: .Dragon Dictation  PLAN OF CARE: Admit to inpatient   PATIENT DISPOSITION:  ICU - intubated and hemodynamically stable.   Delay start of Pharmacological VTE agent (>24hrs) due to surgical blood loss or risk of bleeding: yes

## 2016-05-13 NOTE — OR Nursing (Signed)
SICU calls:  [facility]; [facility]; third [facility] at 13:07 pm [facility]         Joy Reiger,RN

## 2016-05-13 NOTE — Transfer of Care (Signed)
Immediate Anesthesia Transfer of Care Note  Patient: [name]  Procedure(s) Performed: Procedure(s) with comments: CORONARY ARTERY BYPASS GRAFTING (CABG) X 4 UTILIZING THE LEFT INTERNAL MAMMARY ARTERY AND ENDOSCOPICALLY HARVESTED RIGHT  GREATER SAPHENEOUS VEIN. (N/A) - LIMA TO LAD SVG to Diagonal SVG to OM SVG to PDA TRANSESOPHAGEAL ECHOCARDIOGRAM (TEE) (N/A)  Patient Location: SICU  Anesthesia Type:General  Level of Consciousness: Patient remains intubated per anesthesia plan  Airway & Oxygen Therapy: Patient remains intubated per anesthesia plan and Patient placed on Ventilator (see vital sign flow sheet for setting)  Post-op Assessment: Report given to RN and Post -op Vital signs reviewed and stable  Post vital signs: Reviewed and stable  Last Vitals:  Filed Vitals:   05/12/16 2300 05/13/16 0400  BP: 131/81 130/86  Pulse:    Temp: 36.7 C 36.6 C  Resp: 18 16    Last Pain:  Filed Vitals:   05/13/16 0506  PainSc: 0-No pain         Complications: No apparent anesthesia complications

## 2016-05-13 NOTE — Progress Notes (Signed)
PROGRESS NOTE    [name]  QHU:[id number] DOB: [birth year] DOA: 05/09/2016 PCP: Mickie Hillier, MD    Brief Narrative:  45 y.o. male with a Past Medical History of DM, HTN, Tobacco use who presents with CP. CArdiology on board and pt is s/p left heart cath and coronary angiography. Cath reporting severe 3 vCAD   Assessment & Plan:   Principal Problem:   Chest pain/ NSTEMI (non-ST elevated myocardial infarction) [facility]) - Cardiology on board and managing. They have consulted the Cardiothoracic surgeon as patient will require bypass surgery. - pt take to OR today by Cardiothoracic surgeon  Active Problems:   Diabetes mellitus type II, uncontrolled ([place]) - SSI - diabetic diet    Essential hypertension - on ACEI and B blocker    Tobacco abuse - once able to continue nicotine patch   DVT prophylaxis: heparin gtt Code Status: full Family Communication: d/c pt directly Disposition Plan: CABG monday   Consultants:   Cardiology   Procedures: Please refer to EMR and cardiology notes   Antimicrobials: None   Subjective: Patient intubated.  Objective: Filed Vitals:   05/13/16 1430 05/13/16 1435 05/13/16 1500 05/13/16 1559  BP: 119/78  118/76 109/72  Pulse: 95 96 96 91  Temp: 96.3 F (35.7 C) 96.4 F (35.8 C) 96.4 F (35.8 C)   TempSrc:      Resp: '16 16 21 18  ' Height:      Weight:      SpO2: 100% 100% 100% 100%    Intake/Output Summary (Last 24 hours) at 05/13/16 1613 Last data filed at 05/13/16 1500  Gross per 24 hour  Intake 3582.8 ml  Output   4145 ml  Net -562.2 ml   Filed Weights   05/09/16 2011 05/10/16 0634 05/10/16 1634  Weight: 91.309 kg (201 lb 4.8 oz) 91.536 kg (201 lb 12.8 oz) 91.5 kg (201 lb 11.5 oz)    Examination:  General exam: intubated  VSS  Data Reviewed: I have personally reviewed following labs and imaging studies  CBC:  Recent Labs Lab 05/10/16 0305 05/11/16 0227 05/12/16 0319 05/13/16 0313  05/13/16 1125  05/13/16 1131 05/13/16 1232 05/13/16 1344 05/13/16 1345  WBC 7.7 8.0 8.9 9.7  --   --   --   --   --  13.7*  HGB 14.7 15.2 15.0 14.2  < > 9.9* 9.5* 9.2* 11.6* 12.8*  HCT 42.4 43.5 43.4 41.3  < > 28.6* 28.0* 27.0* 34.0* 36.8*  MCV 87.2 87.7 88.8 88.2  --   --   --   --   --  86.2  PLT 138* 155 136* 144*  --  80*  --   --   --  121*  < > = values in this interval not displayed. Basic Metabolic Panel:  Recent Labs Lab 05/08/16 0931 05/09/16 1327 05/12/16 1512 05/13/16 0313 05/13/16 0816 05/13/16 0950 05/13/16 1009 05/13/16 1131 05/13/16 1232 05/13/16 1344  NA '136 135 138 140 140 139 138 138 136 141 '  K 3.5 4.2 4.6 3.8 3.6 3.8 3.6 4.4 3.9 3.4*  CL 102 101 105 104 102 102 98* 101 100*  --   CO2 '27 25 22 28  ' --   --   --   --   --   --   GLUCOSE 318* 295* 123* 124* 131* 143* 125* 124* 195* 163*  BUN '14 11 13 13 13 13 11 11 11  ' --   CREATININE 0.87 1.00 0.89 0.92 0.60* 0.50* 0.60*  0.60* 0.60*  --   CALCIUM 9.1 9.2 9.4 9.2  --   --   --   --   --   --   MG 1.8  --   --   --   --   --   --   --   --   --    GFR: Estimated Creatinine Clearance: 128.9 mL/min (by C-G formula based on Cr of 0.6). Liver Function Tests: No results for input(s): AST, ALT, ALKPHOS, BILITOT, PROT, ALBUMIN in the last 168 hours. No results for input(s): LIPASE, AMYLASE in the last 168 hours. No results for input(s): AMMONIA in the last 168 hours. Coagulation Profile:  Recent Labs Lab 05/08/16 0931 05/13/16 0313 05/13/16 1345  INR 0.97 1.09 1.27   Cardiac Enzymes:  Recent Labs Lab 05/09/16 0302 05/09/16 1533 05/09/16 2121 05/10/16 0305 05/10/16 1105  TROPONINI 0.17* 0.10* 0.09* 0.13* 0.07*   BNP (last 3 results) No results for input(s): PROBNP in the last 8760 hours. HbA1C: No results for input(s): HGBA1C in the last 72 hours. CBG:  Recent Labs Lab 05/12/16 0816 05/12/16 1249 05/12/16 1652 05/12/16 2138 05/13/16 0635  GLUCAP 148* 187* 121* 225* 139*   Lipid Profile: No results  for input(s): CHOL, HDL, LDLCALC, TRIG, CHOLHDL, LDLDIRECT in the last 72 hours. Thyroid Function Tests: No results for input(s): TSH, T4TOTAL, FREET4, T3FREE, THYROIDAB in the last 72 hours. Anemia Panel: No results for input(s): VITAMINB12, FOLATE, FERRITIN, TIBC, IRON, RETICCTPCT in the last 72 hours. Sepsis Labs: No results for input(s): PROCALCITON, LATICACIDVEN in the last 168 hours.  Recent Results (from the past 240 hour(s))  MRSA PCR Screening     Status: None   Collection Time: 05/10/16  4:44 PM  Result Value Ref Range Status   MRSA by PCR NEGATIVE NEGATIVE Final    Comment:        The GeneXpert MRSA Assay (FDA approved for NASAL specimens only), is one component of a comprehensive MRSA colonization surveillance program. It is not intended to diagnose MRSA infection nor to guide or monitor treatment for MRSA infections.   Surgical PCR screen     Status: None   Collection Time: 05/12/16  8:15 PM  Result Value Ref Range Status   MRSA, PCR NEGATIVE NEGATIVE Final   Staphylococcus aureus NEGATIVE NEGATIVE Final    Comment:        The Xpert SA Assay (FDA approved for NASAL specimens in patients over 95 years of age), is one component of a comprehensive surveillance program.  Test performance has been validated by [facility] for patients greater than or equal to 25 year old. It is not intended to diagnose infection nor to guide or monitor treatment. Performed at [facility]          Radiology Studies: Dg Chest Port 1 View  05/13/2016  CLINICAL DATA:  Postop CABG. EXAM: PORTABLE CHEST 1 VIEW COMPARISON:  05/08/2016. FINDINGS: 1456 hours. Endotracheal tube tip is in the midtrachea. Enteric tube projects below the diaphragm. Mediastinal drain and left chest tube are in place. There is a left IJ Swan-Ganz catheter with its tip in the main pulmonary artery or right ventricular outflow tract. Mild atelectasis is present in both lung bases. There is  no edema, pneumothorax or significant pleural effusion. The heart size is stable status post interval CABG. IMPRESSION: No demonstrated complication following CABG.  Bibasilar atelectasis. Electronically Signed   By: Richardean Sale M.D.   On: 05/13/2016 14:10  Scheduled Meds: . [START ON 05/14/2016] acetaminophen  1,000 mg Oral Q6H   Or  . [START ON 05/14/2016] acetaminophen (TYLENOL) oral liquid 160 mg/5 mL  1,000 mg Per Tube Q6H  . [START ON 05/14/2016] antiseptic oral rinse  7 mL Mouth Rinse QID  . [START ON 05/14/2016] aspirin EC  325 mg Oral Daily   Or  . [START ON 05/14/2016] aspirin  324 mg Per Tube Daily  . atorvastatin  80 mg Oral q1800  . [START ON 05/14/2016] bisacodyl  10 mg Oral Daily   Or  . [START ON 05/14/2016] bisacodyl  10 mg Rectal Daily  . budesonide (PULMICORT) nebulizer solution  0.5 mg Nebulization BID  . cefUROXime (ZINACEF)  IV  1.5 g Intravenous Q12H  . chlorhexidine gluconate (SAGE KIT)  15 mL Mouth Rinse BID  . [START ON 05/14/2016] docusate sodium  200 mg Oral Daily  . famotidine (PEPCID) IV  20 mg Intravenous Q12H  . insulin regular  0-10 Units Intravenous TID WC  . ketorolac  15 mg Intravenous Q6H  . levalbuterol  1.25 mg Nebulization Q6H  . magnesium sulfate  4 g Intravenous Once  . metoCLOPramide (REGLAN) injection  10 mg Intravenous Q6H  . metoprolol tartrate  12.5 mg Oral BID   Or  . metoprolol tartrate  12.5 mg Per Tube BID  . nicotine  21 mg Transdermal Daily  . [START ON 05/15/2016] pantoprazole  40 mg Oral Daily  . potassium chloride  10 mEq Intravenous Q1 Hr x 3  . [START ON 05/14/2016] sodium chloride flush  3 mL Intravenous Q12H  . vancomycin  1,000 mg Intravenous Q12H   Continuous Infusions: . sodium chloride    . [START ON 05/14/2016] sodium chloride    . sodium chloride 20 mL/hr at 05/13/16 1400  . amiodarone 30 mg/hr (05/13/16 1400)  . dexmedetomidine 0.5 mcg/kg/hr (05/13/16 1520)  . DOPamine 2 mcg/kg/min (05/13/16 1400)  .  insulin (NOVOLIN-R) infusion 5.7 Units/hr (05/13/16 1500)  . lactated ringers 20 mL/hr at 05/13/16 1400  . lactated ringers 20 mL/hr at 05/13/16 1400  . nitroGLYCERIN Stopped (05/13/16 1400)  . phenylephrine (NEO-SYNEPHRINE) Adult infusion 20 mcg/min (05/13/16 1521)     LOS: 3 days    Time spent: > 20 minutes    Velvet Bathe, MD [facility] Pager [phone]  If 7PM-7AM, please contact night-coverage [email or web address] Password [facility] 05/13/2016, 4:13 PM

## 2016-05-13 NOTE — Anesthesia Procedure Notes (Addendum)
Procedure Name: Intubation Date/Time: 05/13/2016 7:56 AM Performed by: Lucinda DellECARLO, Ashaki Frosch M Pre-anesthesia Checklist: Patient identified, Emergency Drugs available, Suction available, Patient being monitored and Timeout performed Patient Re-evaluated:Patient Re-evaluated prior to inductionOxygen Delivery Method: Simple face mask Preoxygenation: Pre-oxygenation with 100% oxygen Intubation Type: IV induction Ventilation: Mask ventilation without difficulty and Oral airway inserted - appropriate to patient size Laryngoscope Size: Mac and 4 Grade View: Grade I Tube type: Oral Tube size: 8.0 mm Number of attempts: 1 Airway Equipment and Method: Patient positioned with wedge pillow and Bougie stylet Placement Confirmation: ETT inserted through vocal cords under direct vision,  breath sounds checked- equal and bilateral and positive ETCO2 Secured at: 22 cm Tube secured with: Tape Dental Injury: Teeth and Oropharynx as per pre-operative assessment     Procedures: Left  IJ Theone MurdochSwan Ganz Catheter Insertion: [id number]: The patient was identified and consent obtained.  TO was performed, and full barrier precautions were used.  The skin was anesthetized with lidocaine-4cc plain with 25g needle.  Once the vein was located with the 22 ga. needle using ultrasound guidance , the wire was inserted into the vein.  The wire location was confirmed with ultrasound.  The tissue was dilated and the 8.5 [place] cordis catheter was carefully inserted. Afterwards Theone MurdochSwan Ganz catheter was inserted. PA catheter at 47cm.  The patient tolerated the procedure well. RIJ attempt unsuccessful. Patient tolerated procedure well. CE

## 2016-05-14 ENCOUNTER — Encounter (HOSPITAL_COMMUNITY): Payer: Self-pay | Admitting: Cardiothoracic Surgery

## 2016-05-14 ENCOUNTER — Inpatient Hospital Stay (HOSPITAL_COMMUNITY): Payer: BLUE CROSS/BLUE SHIELD

## 2016-05-14 LAB — BASIC METABOLIC PANEL
ANION GAP: 6 (ref 5–15)
BUN: 9 mg/dL (ref 6–20)
CHLORIDE: 107 mmol/L (ref 101–111)
CO2: 25 mmol/L (ref 22–32)
Calcium: 7.8 mg/dL — ABNORMAL LOW (ref 8.9–10.3)
Creatinine, Ser: 0.74 mg/dL (ref 0.61–1.24)
GFR calc non Af Amer: >60 mL/min (ref >60)
GLUCOSE: 114 mg/dL — AB (ref 65–99)
Potassium: 4.3 mmol/L (ref 3.5–5.1)
Sodium: 138 mmol/L (ref 135–145)

## 2016-05-14 LAB — GLUCOSE, CAPILLARY
GLUCOSE-CAPILLARY: 105 mg/dL — AB (ref 65–99)
GLUCOSE-CAPILLARY: 108 mg/dL — AB (ref 65–99)
GLUCOSE-CAPILLARY: 118 mg/dL — AB (ref 65–99)
GLUCOSE-CAPILLARY: 130 mg/dL — AB (ref 65–99)
GLUCOSE-CAPILLARY: 135 mg/dL — AB (ref 65–99)
GLUCOSE-CAPILLARY: 214 mg/dL — AB (ref 65–99)
GLUCOSE-CAPILLARY: 99 mg/dL (ref 65–99)
GLUCOSE-CAPILLARY: 99 mg/dL (ref 65–99)
Glucose-Capillary: 119 mg/dL — ABNORMAL HIGH (ref 65–99)
Glucose-Capillary: 115 mg/dL — ABNORMAL HIGH (ref 65–99)
Glucose-Capillary: 101 mg/dL — ABNORMAL HIGH (ref 65–99)
Glucose-Capillary: 102 mg/dL — ABNORMAL HIGH (ref 65–99)
Glucose-Capillary: 175 mg/dL — ABNORMAL HIGH (ref 65–99)
Glucose-Capillary: 190 mg/dL — ABNORMAL HIGH (ref 65–99)
Glucose-Capillary: 205 mg/dL — ABNORMAL HIGH (ref 65–99)
Glucose-Capillary: 198 mg/dL — ABNORMAL HIGH (ref 65–99)
Glucose-Capillary: 261 mg/dL — ABNORMAL HIGH (ref 65–99)
Glucose-Capillary: 212 mg/dL — ABNORMAL HIGH (ref 65–99)

## 2016-05-14 LAB — CBC
HEMATOCRIT: 32.6 % — AB (ref 39.0–52.0)
HEMATOCRIT: 34 % — AB (ref 39.0–52.0)
HEMOGLOBIN: 11.2 g/dL — AB (ref 13.0–17.0)
Hemoglobin: 11.5 g/dL — ABNORMAL LOW (ref 13.0–17.0)
MCH: 30.2 pg (ref 26.0–34.0)
MCH: 29.8 pg (ref 26.0–34.0)
MCHC: 34.4 g/dL (ref 30.0–36.0)
MCHC: 33.8 g/dL (ref 30.0–36.0)
MCV: 87.9 fL (ref 78.0–100.0)
MCV: 88.1 fL (ref 78.0–100.0)
Platelets: 121 10*3/uL — ABNORMAL LOW (ref 150–400)
Platelets: 111 10*3/uL — ABNORMAL LOW (ref 150–400)
RBC: 3.71 MIL/uL — ABNORMAL LOW (ref 4.22–5.81)
RBC: 3.86 MIL/uL — ABNORMAL LOW (ref 4.22–5.81)
RDW: 12.3 % (ref 11.5–15.5)
RDW: 12.4 % (ref 11.5–15.5)
WBC: 10.9 10*3/uL — AB (ref 4.0–10.5)
WBC: 13.1 10*3/uL — ABNORMAL HIGH (ref 4.0–10.5)

## 2016-05-14 LAB — POCT I-STAT, CHEM 8
BUN: 14 mg/dL (ref 6–20)
CALCIUM ION: 1.13 mmol/L (ref 1.12–1.23)
CHLORIDE: 97 mmol/L — AB (ref 101–111)
CREATININE: 0.9 mg/dL (ref 0.61–1.24)
Glucose, Bld: 252 mg/dL — ABNORMAL HIGH (ref 65–99)
HCT: 36 % — ABNORMAL LOW (ref 39.0–52.0)
HEMOGLOBIN: 12.2 g/dL — AB (ref 13.0–17.0)
POTASSIUM: 4.2 mmol/L (ref 3.5–5.1)
Sodium: 135 mmol/L (ref 135–145)
TCO2: 25 mmol/L (ref 0–100)

## 2016-05-14 LAB — PREPARE PLATELET PHERESIS: Unit division: 0

## 2016-05-14 LAB — CREATININE, SERUM
Creatinine, Ser: 0.94 mg/dL (ref 0.61–1.24)
GFR calc Af Amer: >60 mL/min (ref >60)

## 2016-05-14 LAB — MAGNESIUM
MAGNESIUM: 2.3 mg/dL (ref 1.7–2.4)
Magnesium: 2.4 mg/dL (ref 1.7–2.4)

## 2016-05-14 MED ORDER — INSULIN DETEMIR 100 UNIT/ML ~~LOC~~ SOLN
20.0000 [IU] | Freq: Two times a day (BID) | SUBCUTANEOUS | Status: DC
  Administered 2016-05-14 – 2016-05-17 (×6): 20 [IU] via SUBCUTANEOUS
  Filled 2016-05-14 (×7): qty 0.2

## 2016-05-14 MED ORDER — INSULIN ASPART 100 UNIT/ML ~~LOC~~ SOLN
0.0000 [IU] | SUBCUTANEOUS | Status: DC
  Administered 2016-05-14: 8 [IU] via SUBCUTANEOUS
  Administered 2016-05-14: 4 [IU] via SUBCUTANEOUS
  Administered 2016-05-14: 12 [IU] via SUBCUTANEOUS
  Administered 2016-05-15: 2 [IU] via SUBCUTANEOUS

## 2016-05-14 MED ORDER — FUROSEMIDE 10 MG/ML IJ SOLN
20.0000 mg | Freq: Two times a day (BID) | INTRAMUSCULAR | Status: AC
  Administered 2016-05-14 – 2016-05-15 (×3): 20 mg via INTRAVENOUS
  Filled 2016-05-14 (×3): qty 2

## 2016-05-14 MED ORDER — INSULIN DETEMIR 100 UNIT/ML ~~LOC~~ SOLN
12.0000 [IU] | Freq: Two times a day (BID) | SUBCUTANEOUS | Status: DC
  Administered 2016-05-14: 12 [IU] via SUBCUTANEOUS
  Filled 2016-05-14 (×3): qty 0.12

## 2016-05-14 MED ORDER — AMIODARONE HCL 200 MG PO TABS
400.0000 mg | ORAL_TABLET | Freq: Two times a day (BID) | ORAL | Status: DC
  Administered 2016-05-14 – 2016-05-17 (×7): 400 mg via ORAL
  Filled 2016-05-14 (×7): qty 2

## 2016-05-14 MED ORDER — FENTANYL CITRATE (PF) 100 MCG/2ML IJ SOLN
50.0000 ug | INTRAMUSCULAR | Status: DC | PRN
  Administered 2016-05-14: 50 ug via INTRAVENOUS
  Filled 2016-05-14: qty 2

## 2016-05-14 MED FILL — Electrolyte-R (PH 7.4) Solution: INTRAVENOUS | Qty: 4000 | Status: AC

## 2016-05-14 MED FILL — Heparin Sodium (Porcine) Inj 1000 Unit/ML: INTRAMUSCULAR | Qty: 10 | Status: AC

## 2016-05-14 MED FILL — Sodium Bicarbonate IV Soln 8.4%: INTRAVENOUS | Qty: 50 | Status: AC

## 2016-05-14 MED FILL — Lidocaine HCl IV Inj 20 MG/ML: INTRAVENOUS | Qty: 5 | Status: AC

## 2016-05-14 MED FILL — Mannitol IV Soln 20%: INTRAVENOUS | Qty: 500 | Status: AC

## 2016-05-14 MED FILL — Sodium Chloride IV Soln 0.9%: INTRAVENOUS | Qty: 2000 | Status: AC

## 2016-05-14 NOTE — Op Note (Signed)
[name], [name] NO.:  [id number]  MEDICAL RECORD NO.:  [id number]  LOCATION:  2S12C                        FACILITY:  [facility]  PHYSICIAN:  Kerin Perna, M.D.  DATE OF BIRTH:  1971-08-17  DATE OF PROCEDURE:  05/13/2016 DATE OF DISCHARGE:                              OPERATIVE REPORT   OPERATION: 1. Coronary artery bypass grafting x4 (left internal mammary artery to     left anterior descending, saphenous vein graft to diagonal,     saphenous vein graft to circumflex marginal, saphenous vein graft     to posterior descending branch of the distal circumflex). 2. Endoscopic harvest of right leg greater saphenous vein.  SURGEON:  Kerin Perna, M.D.  ASSISTANT:  Lowella Dandy, PA-C.  ANESTHESIA:  General by Dr. Sampson Goon.  PREOPERATIVE DIAGNOSIS:  Severe three-vessel coronary artery disease with unstable angina.  POSTOPERATIVE DIAGNOSIS:  Severe three-vessel coronary artery disease with unstable angina.  CLINICAL NOTE:  The patient is a 45 year old diabetic smoker with strong family history of CAD, who presents with symptoms of unstable angina and minimally elevated cardiac enzymes.  Echocardiogram showed preserved LV function.  Cardiac catheterization by Dr. Eldridge Dace demonstrated severe multivessel coronary artery disease with 95% stenosis of the LAD and dominant circumflex proximal 80%-90% stenosis.  He was felt to be a candidate for surgical coronary revascularization.  I discussed the results of the cardiac catheterization and 2D echo with the patient and discussed the recommendation for surgical coronary revascularization for treatment of his coronary artery disease.  I discussed the major aspects of the operation including the use of general anesthesia and cardiopulmonary bypass, the location of the surgical incisions, and the expected postoperative hospital recovery.  I discussed with the patient the risks to him of coronary artery bypass  surgery including the risks of stroke, MI, bleeding, blood transfusion requirement, infection, postoperative pulmonary problems, postoperative pleural effusions, postoperative cardiac arrhythmias and death.  He demonstrated his understanding and agrees to proceed with surgery under informed consent. I also discussed the benefits of the procedure including the benefits of improved symptoms, improved survival, and protection from the risk for myocardial infarction.  OPERATIVE FINDINGS: 1. Coronary artery disease in a diabetic type pattern. 2. Adequate targets except for the circumflex vessel which was very     small almost 1 mm in diameter. 3. No packed cell transfusions required for the surgery.  OPERATIVE PROCEDURE:  The patient was brought to the operating room and placed supine on the operating table where general anesthesia was induced under invasive hemodynamic monitoring.  The chest, abdomen and legs were prepped with Betadine and draped as a sterile field.  A proper time-out was performed.  A sternal incision was made and the saphenous vein was harvested endoscopically from the right leg.  The left internal mammary artery was harvested as a pedicle graft from its origin at the subclavian vessels.  It was a 1.4-mm vessel with good flow.  The sternal retractor was placed and the pericardium was opened and suspended. Pursestrings were placed in the ascending aorta and right atrium and heparin was administered.  When the ACT was documented as being therapeutic, the patient was cannulated  and placed on cardiopulmonary bypass.  The coronaries were identified for grafting.  The right coronary was nondominant and small.  The LAD was an adequate target with tight 95% stenosis at its proximal segment.  The OM1 was barely graftable and would not be graftable as a redo.  The distal circumflex posterior descending was a 1.5-mm vessel adequate for grafting.  Cardioplegia cannulae were placed  for both antegrade and retrograde cold blood cardioplegia.  The patient was cooled to 32 degrees.  The aortic crossclamp was applied.  A liter of cold blood cardioplegia was delivered in split doses between the antegrade aortic and retrograde coronary sinus catheters.  There was good cardioplegic arrest, and septal temperature dropped less than 12 degrees.  Cardioplegia was delivered every 20 minutes with the crossclamp in place.  The distal coronary anastomoses were performed.  The first distal anastomosis was the posterior descending branch of the distal circumflex.  This was a 1.5-mm vessel.  There was a proximal 90% stenosis.  A reverse saphenous vein was sewn end-to-side with running 7- 0 Prolene with good flow through the graft.  Cardioplegia was redosed.  The second distal anastomosis was the OM2 branch of the circumflex.  It was a small 1.2-1.4-mm vessel.  There was a proximal 90% stenosis.  A reverse saphenous vein was sewn end-to-side with running 7-0 Prolene with good flow through the graft.  Cardioplegia was redosed.  The third distal anastomosis was to the first diagonal branch to LAD and had an ostial 80% stenosis.  A reverse saphenous vein was sewn end-to- side with running 7-0 Prolene with good flow through the graft. Cardioplegia was re-dosed.  The fourth distal anastomosis was to the distal third of the LAD which was 1.5-mm vessel.  The left IMA pedicle was brought through an opening, and the left lateral pericardium was brought down onto the LAD and sewn end-to-side with running 8-0 Prolene.  There was good flow through the anastomosis after briefly releasing the bulldog pedicle clamp on the mammary pedicle.  The bulldog was reapplied and the pedicle was secured to the epicardium with 6-0 Prolene.  Cardioplegia was redosed.  The crossclamp was still in place.  Three proximal vein anastomoses were performed on the ascending aorta using a 4.5-mm punch running  6-0 Prolene.  Prior to removing the crossclamp, air was vented from the coronaries with a dose of retrograde warm blood cardioplegia.  After the cross-clamp was removed, the heart was cardioverted back to regular rate and rhythm.  The vein grafts were de-aired and opened and each had good flow and hemostasis was documented at the proximal and distal anastomoses.  The patient was rewarmed and reperfused.  Temporary pacing wires were applied.  The lungs were expanded and ventilator was resumed.  The patient was weaned off cardiopulmonary bypass without difficulty.  No inotropes were needed.  Blood pressure and cardiac output were normal.  Echo showed normal LV function.  Protamine was administered without adverse reaction.  The cannulae were removed.  The mediastinum was irrigated.  The superior pericardial fat was closed over the aorta and right ventricle.  Anterior mediastinal left pleural chest tubes were placed and brought out through separate incisions.  The sternum was closed with a wire.  The patient remained stable.  The pectoralis fascia and subcutaneous layers were closed with running Vicryl.  The skin was closed with a subcuticular suture and sterile dressings were applied.  Total cardiopulmonary bypass time was 130 minutes.     Robert Arista  Donata ClayVan Mcgrath, M.D.     PV/MEDQ  D:  05/13/2016  T:  05/14/2016  Job:  [id number]

## 2016-05-14 NOTE — Care Management Note (Signed)
Case Management Note  Patient Details  Name: [name] MRN: [id number] Date of Birth: 01/31/1971  Subjective/Objective:    Pt s/p CABG                Action/Plan:  Pt is from home with wife - wife will provide recommended supervision at discharge.  CM will continue to monitor for disposition needs   Expected Discharge Date:                  Expected Discharge Plan:  Home/Self Care  In-House Referral:     Discharge planning Services  CM Consult  Post Acute Care Choice:    Choice offered to:     DME Arranged:    DME Agency:     HH Arranged:    HH Agency:     Status of Service:  In process, will continue to follow  Medicare Important Message Given:    Date Medicare IM Given:    Medicare IM give by:    Date Additional Medicare IM Given:    Additional Medicare Important Message give by:     If discussed at Long Length of Stay Meetings, dates discussed:    Additional Comments:  Cherylann ParrClaxton, Aubrei Bouchie S, RN 05/14/2016, 3:19 PM

## 2016-05-14 NOTE — Progress Notes (Signed)
PROGRESS NOTE    [name]  WUJ:[id number] DOB: 02/20/1971 DOA: 05/09/2016 PCP: Lubertha SouthSteve Luking, MD    Brief Narrative:  45 y.o. male with a Past Medical History of DM, HTN, Tobacco use who presents with CP. CArdiology on board and pt is s/p left heart cath and coronary angiography. Cath reporting severe 3 vCAD. Pt is S/p CABG x 4   Assessment & Plan:   Principal Problem:   Chest pain/ NSTEMI (non-ST elevated myocardial infarction) (HCC) - S/p CABG x 4, cardiothoracic surgeon managing currently  Active Problems:   Diabetes mellitus type II, uncontrolled (HCC) - SSI - diabetic diet once able to eat    Essential hypertension - currently on B blocker    Tobacco abuse - once able to continue nicotine patch  DVT prophylaxis: heparin gtt Code Status: full Family Communication: d/c pt directly Disposition Plan: CABG monday   Consultants:   Cardiology   Procedures: Please refer to EMR and cardiology notes   Antimicrobials: None   Subjective: Patient extubated smiling. States he feels better  Objective: Filed Vitals:   05/14/16 0731 05/14/16 0800 05/14/16 0839 05/14/16 0921  BP:  103/61    Pulse: 83 79    Temp: 97.5 F (36.4 C) 97.7 F (36.5 C)  98.3 F (36.8 C)  TempSrc:  Core (Comment)  Oral  Resp: 16 18    Height:      Weight:      SpO2: 96% 97% 100%     Intake/Output Summary (Last 24 hours) at 05/14/16 1022 Last data filed at 05/14/16 0913  Gross per 24 hour  Intake 5069.48 ml  Output   6705 ml  Net -1635.52 ml   Filed Weights   05/10/16 0634 05/10/16 1634 05/14/16 0500  Weight: 91.536 kg (201 lb 12.8 oz) 91.5 kg (201 lb 11.5 oz) 95.391 kg (210 lb 4.8 oz)    Examination:  General exam: intubated  CV: no cyanosis Pulm: speaking in full sentences, no wheezes, chest tube in place.  Data Reviewed: I have personally reviewed following labs and imaging studies  CBC:  Recent Labs Lab 05/12/16 0319 05/13/16 0313  05/13/16 1125   05/13/16 1344 05/13/16 1345 05/13/16 2008 05/13/16 2015 05/14/16 0529  WBC 8.9 9.7  --   --   --   --  13.7*  --  14.1* 10.9*  HGB 15.0 14.2  < > 9.9*  < > 11.6* 12.8* 12.9* 12.3* 11.2*  HCT 43.4 41.3  < > 28.6*  < > 34.0* 36.8* 38.0* 34.9* 32.6*  MCV 88.8 88.2  --   --   --   --  86.2  --  86.4 87.9  PLT 136* 144*  --  80*  --   --  121*  --  140* 121*  < > = values in this interval not displayed. Basic Metabolic Panel:  Recent Labs Lab 05/08/16 0931 05/09/16 1327 05/12/16 1512 05/13/16 0313  05/13/16 1009 05/13/16 1131 05/13/16 1232 05/13/16 1344 05/13/16 2008 05/13/16 2015 05/14/16 0529  NA 136 135 138 140  < > 138 138 136 141 143  --  138  K 3.5 4.2 4.6 3.8  < > 3.6 4.4 3.9 3.4* 3.8  --  4.3  CL 102 101 105 104  < > 98* 101 100*  --  103  --  107  CO2 27 25 22 28   --   --   --   --   --   --   --  25  GLUCOSE 318* 295* 123* 124*  < > 125* 124* 195* 163* 104*  --  114*  BUN 14 11 13 13   < > 11 11 11   --  10  --  9  CREATININE 0.87 1.00 0.89 0.92  < > 0.60* 0.60* 0.60*  --  0.70 0.77 0.74  CALCIUM 9.1 9.2 9.4 9.2  --   --   --   --   --   --   --  7.8*  MG 1.8  --   --   --   --   --   --   --   --   --  2.8* 2.4  < > = values in this interval not displayed. GFR: Estimated Creatinine Clearance: 131.5 mL/min (by C-G formula based on Cr of 0.74). Liver Function Tests: No results for input(s): AST, ALT, ALKPHOS, BILITOT, PROT, ALBUMIN in the last 168 hours. No results for input(s): LIPASE, AMYLASE in the last 168 hours. No results for input(s): AMMONIA in the last 168 hours. Coagulation Profile:  Recent Labs Lab 05/08/16 0931 05/13/16 0313 05/13/16 1345  INR 0.97 1.09 1.27   Cardiac Enzymes:  Recent Labs Lab 05/09/16 0302 05/09/16 1533 05/09/16 2121 05/10/16 0305 05/10/16 1105  TROPONINI 0.17* 0.10* 0.09* 0.13* 0.07*   BNP (last 3 results) No results for input(s): PROBNP in the last 8760 hours. HbA1C: No results for input(s): HGBA1C in the last 72  hours. CBG:  Recent Labs Lab 05/13/16 2002 05/13/16 2106 05/13/16 2205 05/13/16 2305 05/14/16 0005  GLUCAP 101* 119* 115* 99 99   Lipid Profile: No results for input(s): CHOL, HDL, LDLCALC, TRIG, CHOLHDL, LDLDIRECT in the last 72 hours. Thyroid Function Tests: No results for input(s): TSH, T4TOTAL, FREET4, T3FREE, THYROIDAB in the last 72 hours. Anemia Panel: No results for input(s): VITAMINB12, FOLATE, FERRITIN, TIBC, IRON, RETICCTPCT in the last 72 hours. Sepsis Labs: No results for input(s): PROCALCITON, LATICACIDVEN in the last 168 hours.  Recent Results (from the past 240 hour(s))  MRSA PCR Screening     Status: None   Collection Time: 05/10/16  4:44 PM  Result Value Ref Range Status   MRSA by PCR NEGATIVE NEGATIVE Final    Comment:        The GeneXpert MRSA Assay (FDA approved for NASAL specimens only), is one component of a comprehensive MRSA colonization surveillance program. It is not intended to diagnose MRSA infection nor to guide or monitor treatment for MRSA infections.   Surgical PCR screen     Status: None   Collection Time: 05/12/16  8:15 PM  Result Value Ref Range Status   MRSA, PCR NEGATIVE NEGATIVE Final   Staphylococcus aureus NEGATIVE NEGATIVE Final    Comment:        The Xpert SA Assay (FDA approved for NASAL specimens in patients over 62 years of age), is one component of a comprehensive surveillance program.  Test performance has been validated by [facility] for patients greater than or equal to 77 year old. It is not intended to diagnose infection nor to guide or monitor treatment. Performed at [facility]          Radiology Studies: Dg Chest Port 1 View  05/14/2016  CLINICAL DATA:  Cardiac surgery. Endotracheal tube and chest tube position EXAM: PORTABLE CHEST 1 VIEW COMPARISON:  05/13/2016 FINDINGS: Endotracheal tube and NG tube removed. Left chest tube remains in place. No pneumothorax Mild bibasilar  atelectasis slightly increased from yesterday. No  effusion or edema. IMPRESSION: Slight increase in bibasilar atelectasis following extubation. No pneumothorax. Electronically Signed   By: Marlan Palau M.D.   On: 05/14/2016 07:32   Dg Chest Port 1 View  05/13/2016  CLINICAL DATA:  Postop CABG. EXAM: PORTABLE CHEST 1 VIEW COMPARISON:  05/08/2016. FINDINGS: 1456 hours. Endotracheal tube tip is in the midtrachea. Enteric tube projects below the diaphragm. Mediastinal drain and left chest tube are in place. There is a left IJ Swan-Ganz catheter with its tip in the main pulmonary artery or right ventricular outflow tract. Mild atelectasis is present in both lung bases. There is no edema, pneumothorax or significant pleural effusion. The heart size is stable status post interval CABG. IMPRESSION: No demonstrated complication following CABG.  Bibasilar atelectasis. Electronically Signed   By: Carey Bullocks M.D.   On: 05/13/2016 14:10        Scheduled Meds: . acetaminophen  1,000 mg Oral Q6H   Or  . acetaminophen (TYLENOL) oral liquid 160 mg/5 mL  1,000 mg Per Tube Q6H  . amiodarone  400 mg Oral BID  . antiseptic oral rinse  7 mL Mouth Rinse BID  . aspirin EC  325 mg Oral Daily   Or  . aspirin  324 mg Per Tube Daily  . atorvastatin  80 mg Oral q1800  . bisacodyl  10 mg Oral Daily   Or  . bisacodyl  10 mg Rectal Daily  . budesonide (PULMICORT) nebulizer solution  0.5 mg Nebulization BID  . cefUROXime (ZINACEF)  IV  1.5 g Intravenous Q12H  . docusate sodium  200 mg Oral Daily  . furosemide  20 mg Intravenous BID  . insulin aspart  0-24 Units Subcutaneous Q4H  . insulin detemir  12 Units Subcutaneous BID  . insulin regular  0-10 Units Intravenous TID WC  . ketorolac  15 mg Intravenous Q6H  . levalbuterol  1.25 mg Nebulization Q6H  . metoCLOPramide (REGLAN) injection  10 mg Intravenous Q6H  . metoprolol tartrate  12.5 mg Oral BID   Or  . metoprolol tartrate  12.5 mg Per Tube BID  .  nicotine  21 mg Transdermal Daily  . [START ON 05/15/2016] pantoprazole  40 mg Oral Daily  . sodium chloride flush  3 mL Intravenous Q12H  . vancomycin  1,000 mg Intravenous Q12H   Continuous Infusions: . sodium chloride    . sodium chloride    . sodium chloride 20 mL/hr at 05/14/16 0600  . insulin (NOVOLIN-R) infusion 1.2 Units/hr (05/14/16 0800)  . lactated ringers 20 mL/hr at 05/14/16 0600  . lactated ringers 20 mL/hr at 05/14/16 0600  . nitroGLYCERIN Stopped (05/14/16 0815)  . phenylephrine (NEO-SYNEPHRINE) Adult infusion Stopped (05/13/16 2300)     LOS: 4 days    Time spent: > 20 minutes    Penny Pia, MD [facility] Pager [phone]  If 7PM-7AM, please contact night-coverage [email or web address] Password [facility] 05/14/2016, 10:22 AM

## 2016-05-14 NOTE — Progress Notes (Signed)
1 Day Post-Op Procedure(s) (LRB): CORONARY ARTERY BYPASS GRAFTING (CABG) X 4 UTILIZING THE LEFT INTERNAL MAMMARY ARTERY AND ENDOSCOPICALLY HARVESTED RIGHT  GREATER SAPHENEOUS VEIN. (N/A) TRANSESOPHAGEAL ECHOCARDIOGRAM (TEE) (N/A) Subjective: Hemodynamically stable Pain control good Will convert to lantus and SSI glucose coverage  Objective: Vital signs in last 24 hours: Temp:  [96.3 F (35.7 C)-98.4 F (36.9 C)] 97.9 F (36.6 C) (06/13 0600) Pulse Rate:  [71-97] 88 (06/13 0600) Cardiac Rhythm:  [-] Normal sinus rhythm (06/13 0400) Resp:  [10-26] 15 (06/13 0600) BP: (84-130)/(57-83) 108/76 mmHg (06/13 0600) SpO2:  [100 %] 100 % (06/13 0600) Arterial Line BP: (91-159)/(47-73) 150/73 mmHg (06/13 0600) FiO2 (%):  [40 %-50 %] 40 % (06/12 1730) Weight:  [210 lb 4.8 oz (95.391 kg)] 210 lb 4.8 oz (95.391 kg) (06/13 0500)  Hemodynamic parameters for last 24 hours: PAP: (17-32)/(4-12) 23/12 mmHg CO:  [4 L/min-7.3 L/min] 5.9 L/min CI:  [1.9 L/min/m2-3.5 L/min/m2] 2.8 L/min/m2  Intake/Output from previous day: 06/12 0701 - 06/13 0700 In: 5755.8 [I.V.:3150.8; Blood:925; NG/GT:30; IV Piggyback:1650] Out: 1610 [RUEAV:[id number] [Urine:4645; Emesis/NG output:175; Blood:1625; Chest Tube:400] Intake/Output this shift:         Exam    General- alert and comfortable   Lungs- clear without rales, wheezes   Cor- regular rate and rhythm, no murmur , gallop   Abdomen- soft, non-tender   Extremities - warm, non-tender, minimal edema   Neuro- oriented, appropriate, no focal weakness   Lab Results:  Recent Labs  05/13/16 2015 05/14/16 0529  WBC 14.1* 10.9*  HGB 12.3* 11.2*  HCT 34.9* 32.6*  PLT 140* 121*   BMET:  Recent Labs  05/13/16 0313  05/13/16 2008 05/13/16 2015 05/14/16 0529  NA 140  < > 143  --  138  K 3.8  < > 3.8  --  4.3  CL 104  < > 103  --  107  CO2 28  --   --   --  25  GLUCOSE 124*  < > 104*  --  114*  BUN 13  < > 10  --  9  CREATININE 0.92  < > 0.70 0.77 0.74  CALCIUM 9.2   --   --   --  7.8*  < > = values in this interval not displayed.  PT/INR:  Recent Labs  05/13/16 1345  LABPROT 16.1*  INR 1.27   ABG    Component Value Date/Time   PHART 7.314* 05/13/2016 2003   HCO3 24.9* 05/13/2016 2003   TCO2 25 05/13/2016 2008   ACIDBASEDEF 2.0 05/13/2016 2003   O2SAT 98.0 05/13/2016 2003   CBG (last 3)   Recent Labs  05/13/16 2205 05/13/16 2305 05/14/16 0005  GLUCAP 115* 99 99    Assessment/Plan: S/P Procedure(s) (LRB): CORONARY ARTERY BYPASS GRAFTING (CABG) X 4 UTILIZING THE LEFT INTERNAL MAMMARY ARTERY AND ENDOSCOPICALLY HARVESTED RIGHT  GREATER SAPHENEOUS VEIN. (N/A) TRANSESOPHAGEAL ECHOCARDIOGRAM (TEE) (N/A) Mobilize Diuresis Diabetes control d/c tubes/lines See progression orders   LOS: 4 days    Kathlee Nationseter Van Trigt III 05/14/2016

## 2016-05-14 NOTE — Progress Notes (Signed)
Patient ID: [name], male   DOB: 09/06/1971, 45 y.o.   MRN: [id number] EVENING ROUNDS NOTE :     [address]       [place] [id number]             [phone]                 1 Day Post-Op Procedure(s) (LRB): CORONARY ARTERY BYPASS GRAFTING (CABG) X 4 UTILIZING THE LEFT INTERNAL MAMMARY ARTERY AND ENDOSCOPICALLY HARVESTED RIGHT  GREATER SAPHENEOUS VEIN. (N/A) TRANSESOPHAGEAL ECHOCARDIOGRAM (TEE) (N/A)  Total Length of Stay:  LOS: 4 days  BP 139/78 mmHg  Pulse 98  Temp(Src) 97.6 F (36.4 C) (Oral)  Resp 15  Ht 5\' 9"  (1.753 m)  Wt 210 lb 4.8 oz (95.391 kg)  BMI 31.04 kg/m2  SpO2 97%  .Intake/Output      06/12 0701 - 06/13 0700 06/13 0701 - 06/14 0700   I.V. (mL/kg) 3228.1 (33.8) 387.8 (4.1)   Blood 925    NG/GT 30    IV Piggyback 1650 250   Total Intake(mL/kg) 5833.1 (61.2) 637.8 (6.7)   Urine (mL/kg/hr) 4695 (2.1) 1150 (1.1)   Emesis/NG output 175 (0.1)    Blood 1625 (0.7)    Chest Tube 400 (0.2) 90 (0.1)   Total Output 6895 1240   Net -1061.9 -602.2          . sodium chloride    . sodium chloride    . sodium chloride Stopped (05/14/16 0900)  . insulin (NOVOLIN-R) infusion Stopped (05/14/16 1154)  . lactated ringers 20 mL/hr at 05/14/16 1000  . lactated ringers Stopped (05/14/16 0800)  . nitroGLYCERIN Stopped (05/14/16 0815)  . phenylephrine (NEO-SYNEPHRINE) Adult infusion Stopped (05/13/16 2300)     Lab Results  Component Value Date   WBC 13.1* 05/14/2016   HGB 12.2* 05/14/2016   HCT 36.0* 05/14/2016   PLT 111* 05/14/2016   GLUCOSE 252* 05/14/2016   CHOL 239* 05/10/2016   TRIG 427* 05/10/2016   HDL 26* 05/10/2016   LDLCALC UNABLE TO CALCULATE IF TRIGLYCERIDE OVER 400 mg/dL 02/72/536606/08/2016   NA 440135 34/74/259506/13/2017   K 4.2 05/14/2016   CL 97* 05/14/2016   CREATININE 0.90 05/14/2016   BUN 14 05/14/2016   CO2 25 05/14/2016   INR 1.27 05/13/2016   HGBA1C 10.2* 05/09/2016   Stable day, walked around unit Delight OvensEdward B Jayvier Burgher MD  Beeper  [phone] Office [phone] 05/14/2016 6:29 PM

## 2016-05-15 ENCOUNTER — Inpatient Hospital Stay (HOSPITAL_COMMUNITY): Payer: BLUE CROSS/BLUE SHIELD

## 2016-05-15 LAB — GLUCOSE, CAPILLARY
GLUCOSE-CAPILLARY: 138 mg/dL — AB (ref 65–99)
GLUCOSE-CAPILLARY: 148 mg/dL — AB (ref 65–99)
GLUCOSE-CAPILLARY: 163 mg/dL — AB (ref 65–99)
GLUCOSE-CAPILLARY: 176 mg/dL — AB (ref 65–99)
Glucose-Capillary: 104 mg/dL — ABNORMAL HIGH (ref 65–99)
Glucose-Capillary: 226 mg/dL — ABNORMAL HIGH (ref 65–99)

## 2016-05-15 LAB — CBC
HCT: 32.3 % — ABNORMAL LOW (ref 39.0–52.0)
Hemoglobin: 10.9 g/dL — ABNORMAL LOW (ref 13.0–17.0)
MCH: 29.7 pg (ref 26.0–34.0)
MCHC: 33.7 g/dL (ref 30.0–36.0)
MCV: 88 fL (ref 78.0–100.0)
Platelets: 103 10*3/uL — ABNORMAL LOW (ref 150–400)
RBC: 3.67 MIL/uL — ABNORMAL LOW (ref 4.22–5.81)
RDW: 12.1 % (ref 11.5–15.5)
WBC: 9.8 10*3/uL (ref 4.0–10.5)

## 2016-05-15 LAB — BASIC METABOLIC PANEL
Anion gap: 5 (ref 5–15)
BUN: 11 mg/dL (ref 6–20)
CO2: 29 mmol/L (ref 22–32)
Calcium: 8.4 mg/dL — ABNORMAL LOW (ref 8.9–10.3)
Chloride: 101 mmol/L (ref 101–111)
Creatinine, Ser: 0.85 mg/dL (ref 0.61–1.24)
GFR calc Af Amer: >60 mL/min (ref >60)
GFR calc non Af Amer: >60 mL/min (ref >60)
Glucose, Bld: 113 mg/dL — ABNORMAL HIGH (ref 65–99)
Potassium: 3.8 mmol/L (ref 3.5–5.1)
Sodium: 135 mmol/L (ref 135–145)

## 2016-05-15 MED ORDER — MAGNESIUM HYDROXIDE 400 MG/5ML PO SUSP: 30.0000 mL | Freq: Every day | ORAL | Status: DC | PRN

## 2016-05-15 MED ORDER — SODIUM CHLORIDE 0.9% FLUSH: 3.0000 mL | INTRAVENOUS | Status: DC | PRN

## 2016-05-15 MED ORDER — SODIUM CHLORIDE 0.9 % IV SOLN: 250.0000 mL | INTRAVENOUS | Status: DC | PRN

## 2016-05-15 MED ORDER — INSULIN ASPART 100 UNIT/ML ~~LOC~~ SOLN
0.0000 [IU] | Freq: Three times a day (TID) | SUBCUTANEOUS | Status: DC
  Administered 2016-05-15: 4 [IU] via SUBCUTANEOUS
  Administered 2016-05-15: 2 [IU] via SUBCUTANEOUS
  Administered 2016-05-15: 8 [IU] via SUBCUTANEOUS
  Administered 2016-05-16 – 2016-05-17 (×4): 4 [IU] via SUBCUTANEOUS

## 2016-05-15 MED ORDER — MOVING RIGHT ALONG BOOK
Freq: Once | Status: AC
  Administered 2016-05-15: 1
  Filled 2016-05-15: qty 1

## 2016-05-15 MED ORDER — GUAIFENESIN ER 600 MG PO TB12
600.0000 mg | ORAL_TABLET | Freq: Two times a day (BID) | ORAL | Status: DC
  Administered 2016-05-15 – 2016-05-17 (×5): 600 mg via ORAL
  Filled 2016-05-15 (×6): qty 1

## 2016-05-15 MED ORDER — POTASSIUM CHLORIDE CRYS ER 20 MEQ PO TBCR
20.0000 meq | EXTENDED_RELEASE_TABLET | Freq: Every day | ORAL | Status: DC
  Administered 2016-05-16 – 2016-05-17 (×2): 20 meq via ORAL
  Filled 2016-05-15 (×2): qty 1

## 2016-05-15 MED ORDER — SODIUM CHLORIDE 0.9% FLUSH
3.0000 mL | Freq: Two times a day (BID) | INTRAVENOUS | Status: DC
  Administered 2016-05-15 – 2016-05-16 (×2): 3 mL via INTRAVENOUS

## 2016-05-15 MED ORDER — POTASSIUM CHLORIDE 10 MEQ/50ML IV SOLN
10.0000 meq | INTRAVENOUS | Status: AC
  Administered 2016-05-15 (×2): 10 meq via INTRAVENOUS
  Filled 2016-05-15: qty 50

## 2016-05-15 MED ORDER — LIVING WELL WITH DIABETES BOOK
Freq: Once | Status: AC
  Administered 2016-05-17: 10:00:00
  Filled 2016-05-15: qty 1

## 2016-05-15 MED ORDER — INSULIN STARTER KIT- PEN NEEDLES (ENGLISH)
1.0000 | Freq: Once | Status: AC
  Administered 2016-05-17: 1
  Filled 2016-05-15: qty 1

## 2016-05-15 MED ORDER — FUROSEMIDE 40 MG PO TABS
40.0000 mg | ORAL_TABLET | Freq: Every day | ORAL | Status: DC
Start: 2016-05-15 — End: 2016-05-17
  Administered 2016-05-15 – 2016-05-17 (×3): 40 mg via ORAL
  Filled 2016-05-15 (×3): qty 1

## 2016-05-15 NOTE — Progress Notes (Signed)
2 Days Post-Op Procedure(s) (LRB): CORONARY ARTERY BYPASS GRAFTING (CABG) X 4 UTILIZING THE LEFT INTERNAL MAMMARY ARTERY AND ENDOSCOPICALLY HARVESTED RIGHT  GREATER SAPHENEOUS VEIN. (N/A) TRANSESOPHAGEAL ECHOCARDIOGRAM (TEE) (N/A) Subjective: No complaints Stable progress post CABG  Objective: Vital signs in last 24 hours: Temp:  [97.6 F (36.4 C)-99.2 F (37.3 C)] 98.5 F (36.9 C) (06/14 0815) Pulse Rate:  [79-98] 90 (06/14 0400) Cardiac Rhythm:  [-] Normal sinus rhythm (06/14 0400) Resp:  [7-29] 7 (06/14 0700) BP: (107-139)/(47-108) 112/47 mmHg (06/14 0700) SpO2:  [96 %-99 %] 99 % (06/14 0815) Arterial Line BP: (144)/(69) 144/69 mmHg (06/13 0900) Weight:  [209 lb 3.5 oz (94.9 kg)] 209 lb 3.5 oz (94.9 kg) (06/14 0500)  Hemodynamic parameters for last 24 hours: PAP: (30)/(14) 30/14 mmHg  Intake/Output from previous day: 06/13 0701 - 06/14 0700 In: 1147.8 [I.V.:647.8; IV Piggyback:500] Out: 2665 [Urine:2575; Chest Tube:90] Intake/Output this shift:         Exam    General- alert and comfortable   Lungs- clear without rales, wheezes   Cor- regular rate and rhythm, no murmur , gallop   Abdomen- soft, non-tender   Extremities - warm, non-tender, minimal edema   Neuro- oriented, appropriate, no focal weakness   Lab Results:  Recent Labs  05/14/16 1630 05/14/16 1641 05/15/16 0415  WBC 13.1*  --  9.8  HGB 11.5* 12.2* 10.9*  HCT 34.0* 36.0* 32.3*  PLT 111*  --  103*   BMET:  Recent Labs  05/14/16 0529  05/14/16 1641 05/15/16 0415  NA 138  --  135 135  K 4.3  --  4.2 3.8  CL 107  --  97* 101  CO2 25  --   --  29  GLUCOSE 114*  --  252* 113*  BUN 9  --  14 11  CREATININE 0.74  < > 0.90 0.85  CALCIUM 7.8*  --   --  8.4*  < > = values in this interval not displayed.  PT/INR:  Recent Labs  05/13/16 1345  LABPROT 16.1*  INR 1.27   ABG    Component Value Date/Time   PHART 7.314* 05/13/2016 2003   HCO3 24.9* 05/13/2016 2003   TCO2 25 05/14/2016 1641   ACIDBASEDEF 2.0 05/13/2016 2003   O2SAT 98.0 05/13/2016 2003   CBG (last 3)   Recent Labs  05/14/16 1921 05/14/16 2358 05/15/16 0410  GLUCAP 212* 138* 104*    Assessment/Plan: S/P Procedure(s) (LRB): CORONARY ARTERY BYPASS GRAFTING (CABG) X 4 UTILIZING THE LEFT INTERNAL MAMMARY ARTERY AND ENDOSCOPICALLY HARVESTED RIGHT  GREATER SAPHENEOUS VEIN. (N/A) TRANSESOPHAGEAL ECHOCARDIOGRAM (TEE) (N/A) Mobilize Diuresis Diabetes control transfer to 2 [address]   LOS: 5 days    Kathlee Nationseter Van Trigt III 05/15/2016

## 2016-05-15 NOTE — Progress Notes (Signed)
Report given to Dickson CityKatheryn, RN on [address], a/w bedrest to be over to transfer, family at bedside and made aware of room assigned when transferred.  Louie BunWilson,Stokely Jeancharles S 1:02 PM

## 2016-05-15 NOTE — Progress Notes (Signed)
Received on [address]  To room  2W16,  Alert & oriented vital signs stable, mid sternal dressing clean dry & intact, ambulatory in room, denies any pain  Governor SpeckingKathryn Jayceion Lisenby, RN

## 2016-05-16 ENCOUNTER — Inpatient Hospital Stay (HOSPITAL_COMMUNITY): Payer: BLUE CROSS/BLUE SHIELD

## 2016-05-16 ENCOUNTER — Telehealth: Payer: Self-pay | Admitting: Physician Assistant

## 2016-05-16 DIAGNOSIS — I25119 Atherosclerotic heart disease of native coronary artery with unspecified angina pectoris: Secondary | ICD-10-CM

## 2016-05-16 DIAGNOSIS — I2510 Atherosclerotic heart disease of native coronary artery without angina pectoris: Secondary | ICD-10-CM | POA: Insufficient documentation

## 2016-05-16 LAB — CBC
HCT: 33.2 % — ABNORMAL LOW (ref 39.0–52.0)
Hemoglobin: 11 g/dL — ABNORMAL LOW (ref 13.0–17.0)
MCH: 29.4 pg (ref 26.0–34.0)
MCHC: 33.1 g/dL (ref 30.0–36.0)
MCV: 88.8 fL (ref 78.0–100.0)
Platelets: 129 10*3/uL — ABNORMAL LOW (ref 150–400)
RBC: 3.74 MIL/uL — ABNORMAL LOW (ref 4.22–5.81)
RDW: 12.4 % (ref 11.5–15.5)
WBC: 11.1 10*3/uL — ABNORMAL HIGH (ref 4.0–10.5)

## 2016-05-16 LAB — GLUCOSE, CAPILLARY
GLUCOSE-CAPILLARY: 187 mg/dL — AB (ref 65–99)
GLUCOSE-CAPILLARY: 196 mg/dL — AB (ref 65–99)
GLUCOSE-CAPILLARY: 191 mg/dL — AB (ref 65–99)
Glucose-Capillary: 151 mg/dL — ABNORMAL HIGH (ref 65–99)

## 2016-05-16 LAB — TYPE AND SCREEN
ABO/RH(D): A NEG
Antibody Screen: NEGATIVE
Unit division: 0
Unit division: 0

## 2016-05-16 LAB — BASIC METABOLIC PANEL
Anion gap: 8 (ref 5–15)
BUN: 14 mg/dL (ref 6–20)
CO2: 30 mmol/L (ref 22–32)
Calcium: 8.7 mg/dL — ABNORMAL LOW (ref 8.9–10.3)
Chloride: 98 mmol/L — ABNORMAL LOW (ref 101–111)
Creatinine, Ser: 0.98 mg/dL (ref 0.61–1.24)
GFR calc Af Amer: >60 mL/min (ref >60)
GFR calc non Af Amer: >60 mL/min (ref >60)
Glucose, Bld: 109 mg/dL — ABNORMAL HIGH (ref 65–99)
Potassium: 4.2 mmol/L (ref 3.5–5.1)
Sodium: 136 mmol/L (ref 135–145)

## 2016-05-16 MED ORDER — LISINOPRIL 2.5 MG PO TABS
2.5000 mg | ORAL_TABLET | Freq: Every day | ORAL | Status: DC
  Administered 2016-05-16 – 2016-05-17 (×2): 2.5 mg via ORAL
  Filled 2016-05-16 (×2): qty 1

## 2016-05-16 MED ORDER — LEVALBUTEROL HCL 1.25 MG/0.5ML IN NEBU
1.2500 mg | INHALATION_SOLUTION | Freq: Two times a day (BID) | RESPIRATORY_TRACT | Status: DC
Start: 2016-05-16 — End: 2016-05-17
  Administered 2016-05-16 (×2): 1.25 mg via RESPIRATORY_TRACT
  Filled 2016-05-16 (×3): qty 0.5

## 2016-05-16 NOTE — Anesthesia Postprocedure Evaluation (Signed)
Anesthesia Post Note  Patient: [name]  Procedure(s) Performed: Procedure(s) (LRB): CORONARY ARTERY BYPASS GRAFTING (CABG) X 4 UTILIZING THE LEFT INTERNAL MAMMARY ARTERY AND ENDOSCOPICALLY HARVESTED RIGHT  GREATER SAPHENEOUS VEIN. (N/A) TRANSESOPHAGEAL ECHOCARDIOGRAM (TEE) (N/A)  Patient location during evaluation: SICU Anesthesia Type: General Level of consciousness: sedated Pain management: pain level controlled Vital Signs Assessment: post-procedure vital signs reviewed and stable Respiratory status: patient remains intubated per anesthesia plan Cardiovascular status: stable Anesthetic complications: no    Last Vitals:  Filed Vitals:   05/15/16 2047 05/16/16 0624  BP: 114/81 105/68  Pulse: 98 85  Temp: 36.4 C 36.7 C  Resp: 20     Last Pain:  Filed Vitals:   05/16/16 0952  PainSc: 5                  Kennieth RadFitzgerald, Kassia Demarinis E

## 2016-05-16 NOTE — Progress Notes (Signed)
Utilization review completed.  

## 2016-05-16 NOTE — Telephone Encounter (Signed)
Received message while covering [name]'s inbasket that CVTS requests f/u appointment for this patient post-CABG. Please arrange for 1 week, TCM for NSTEMI, post CABG, patient of Dr. Duke Salviaandolph. Call patient with appointment.  Dayna Dunn PA-C

## 2016-05-16 NOTE — Discharge Summary (Signed)
Physician Discharge Summary  Patient ID: [name] MRN: [id number] DOB/AGE: Mar 30, 1971 45 y.o.  Admit date: 05/09/2016 Discharge date: 05/17/2016  Admission Diagnoses:  Patient Active Problem List   Diagnosis Date Noted  . NSTEMI (non-ST elevated myocardial infarction) ([place])   . Tobacco abuse 05/09/2016  . Essential hypertension   . Diabetes mellitus with complication ([place])   . Chest pain 05/08/2016  . Diabetes mellitus type II, uncontrolled ([place]) 05/08/2016   Discharge Diagnoses:   Patient Active Problem List   Diagnosis Date Noted  . CAD (coronary artery disease)   . S/P CABG x 4 05/13/2016  . NSTEMI (non-ST elevated myocardial infarction) ([place])   . Tobacco abuse 05/09/2016  . Essential hypertension   . Diabetes mellitus with complication ([place])   . Chest pain 05/08/2016  . Diabetes mellitus type II, uncontrolled ([place]) 05/08/2016   Discharged Condition: good  History of Present Illness:  [name] is a 45 yo white male with known history of nicotine abuse and poorly controlled diabetes with a pre operative A1c of 10.5.  He presented to the ED with complaints of sudden onset chest pain with associated shortness of breath.  Work up showed non-specific EKG changes and mildly elevated cardiac enzymes.  The patient left the ED AMA after his chest pain resolved.  He again presented to [facility] ED on 05/09/2016 with similar complaints.  At that time he was agreeable to further workup.  He underwent a stress test which showed ischemic changes in the inferior and inferolateral wall.  Cardiac catheterization was also done and showed significant multivessel CAD with preserved EF.  It was felt he should be admitted and he was placed on a Heparin drip.  TCTS consult was obtained for coronary bypass procedure.  Hospital Course:   The patient was evaluated by Dr. Prescott Gum who was in agreement the patient would require CABG procedure.  The risks and benefits of the procedure were explained  to the patient and he was agreeable to proceed.  He remained chest pain free during hospitalization.  He was taken to the operating room on 05/13/2016.  He underwent CABG x 4 utilizing LIMA to LAD, SVG to Diagonal, SVG to OM, and SVG to PDA.  He also underwent endoscopic harvest of greater saphenous vein from the right leg.  The patient was extubated the evening of surgery.  During his stay in the SICU the patient was weaned from Dopamine and NTG drip as tolerated.  He had Atrial Fibrillation in the OR and was transitioned to an oral regimen of Amiodarone.  His chest tubes and arterial lines were removed without difficulty.  He was maintaining NSR and felt medically stable for transfer to the telemetry unit in stable condition.  He continues to make progress.  He continues to maintain NSR.  His pacing wires were removed without difficulty.  He completed diabetes education.  He will be discharged home on Metformin and long acting insulin.  He will require close follow up with PCP for better management of his diabetes.  He is ambulating independently.  Tolerating a diabetic diet.  He is felt medically stable for discharge home today.     Significant Diagnostic Studies: angiography:    Prox RCA to Mid RCA lesion, 80% stenosed. Nondominant vessel.  Ost Cx to Prox Cx lesion, 80% stenosed.  Mid Cx-1 lesion, 90% stenosed.  Mid Cx-2 lesion, 75% stenosed.  Ost 2nd Mrg to 2nd Mrg lesion, 95% stenosed. Small vessel.  Ostial LAD to  diastalLM lesion, 90% stenosed. Ulcerated lesion.  Mid LAD lesion, 80% stenosed.  The left ventricular systolic function is normal.  Ost LPDA lesion, 90% stenosed.  Treatments: surgery:   1. Coronary artery bypass grafting x4 (left internal mammary artery to left anterior descending, saphenous vein graft to diagonal, saphenous vein graft to circumflex marginal, saphenous vein graft to posterior descending branch of the distal circumflex). 2. Endoscopic harvest of right leg  greater saphenous vein  Disposition: 01-Home or Self Care   Discharge Medications:  The patient has been discharged on:   1.Beta Blocker:  Yes [ x  ]                              No   [   ]                              If No, reason:  2.Ace Inhibitor/ARB: Yes [ x  ]                                     No  [    ]                                     If No, reason:  3.Statin:   Yes [ x  ]                  No  [   ]                  If No, reason:  4.Ecasa:  Yes  [x  ]                  No   [   ]                  If No, reason:         Discharge Instructions    Amb Referral to Cardiac Rehabilitation    Complete by:  As directed   Diagnosis:  CABG  CABG X ___:  4            Medication List    STOP taking these medications        naproxen sodium 220 MG tablet  Commonly known as:  ANAPROX      TAKE these medications        acetaminophen 500 MG tablet  Commonly known as:  TYLENOL  Take 2 tablets (1,000 mg total) by mouth every 6 (six) hours as needed for mild pain or fever.     amiodarone 200 MG tablet  Commonly known as:  PACERONE  Take 1 tablet (200 mg total) by mouth 2 (two) times daily.     aspirin 325 MG EC tablet  Take 1 tablet (325 mg total) by mouth daily.     atorvastatin 80 MG tablet  Commonly known as:  LIPITOR  Take 1 tablet (80 mg total) by mouth daily at 6 PM.     blood glucose meter kit and supplies  Dispense based on patient and insurance preference. Use up to four times daily as directed. (FOR ICD-9 250.00, 250.01).     furosemide 40 MG tablet  Commonly known  as:  LASIX  Take 1 tablet (40 mg total) by mouth daily. For 7 Days     glucosamine-chondroitin 500-400 MG tablet  Take 1 tablet by mouth daily.     guaiFENesin 600 MG 12 hr tablet  Commonly known as:  MUCINEX  Take 1 tablet (600 mg total) by mouth 2 (two) times daily as needed for to loosen phlegm.     Insulin Detemir 100 UNIT/ML Pen  Commonly known as:  LEVEMIR FLEXPEN   Inject 20 Units into the skin daily at 10 pm.     Insulin Pen Needle 31G X 6 MM Misc  1 Units by Does not apply route at bedtime. Please use clean needle every insulin administration     lisinopril 2.5 MG tablet  Commonly known as:  PRINIVIL,ZESTRIL  Take 1 tablet (2.5 mg total) by mouth daily.     metFORMIN 500 MG tablet  Commonly known as:  GLUCOPHAGE  Take 1 tablet (500 mg total) by mouth 2 (two) times daily with a meal.     metoprolol tartrate 25 MG tablet  Commonly known as:  LOPRESSOR  Take 0.5 tablets (12.5 mg total) by mouth 2 (two) times daily.     nicotine 21 mg/24hr patch  Commonly known as:  NICODERM CQ - dosed in mg/24 hours  Place 1 patch (21 mg total) onto the skin daily.     oxyCODONE 5 MG immediate release tablet  Commonly known as:  Oxy IR/ROXICODONE  Take 1-2 tablets (5-10 mg total) by mouth every 3 (three) hours as needed for severe pain.     potassium chloride SA 20 MEQ tablet  Commonly known as:  K-DUR,KLOR-CON  Take 1 tablet (20 mEq total) by mouth daily. For 7 Days       Follow-up Information    Follow up with Len Childs, MD On 06/12/2016.   Specialty:  Cardiothoracic Surgery   Why:  Appointment is at 3:30   Contact information:   [place] [address] [phone]       Follow up with [facility] On 06/12/2016.   Why:  Please get CXR at 3:00 prior to your appointment with Dr. Prescott Gum, located on first floor of our office building   Contact information:   [facility]       Follow up with Larae Grooms, MD.   Specialties:  Cardiology, Radiology, Interventional Cardiology   Why:  Office will contact you with appointment   Contact information:   [address] [place] [address] [phone]       Follow up with Mickie Hillier, MD.   Specialty:  Family Medicine   Why:  Please contact to set up early follow up for 1-2 weeks for hospital follow up and management of  diabetes   Contact information:   [place] [address] [phone]       Signed: Ellwood Handler 05/17/2016, 8:08 AM

## 2016-05-16 NOTE — Progress Notes (Signed)
[id number] Cardiac Rehab Pt walked independently in hall this am denies any problems. Completed discharge education with pt. He voices understanding. He states that he has quit smoking. I gave him smoking cessation information. Pt seems very motivated to making changes He watched recovery from heart surgery video pre-op. Melina CopaLisa Orvill Coulthard RN.

## 2016-05-16 NOTE — Progress Notes (Addendum)
      [address]       [place] [id number]             [phone]      3 Days Post-Op Procedure(s) (LRB): CORONARY ARTERY BYPASS GRAFTING (CABG) X 4 UTILIZING THE LEFT INTERNAL MAMMARY ARTERY AND ENDOSCOPICALLY HARVESTED RIGHT  GREATER SAPHENEOUS VEIN. (N/A) TRANSESOPHAGEAL ECHOCARDIOGRAM (TEE) (N/A)   Subjective:  [name] is feeling pretty good.  He is ambulating without difficulty.  + BM  Objective: Vital signs in last 24 hours: Temp:  [97.4 F (36.3 C)-98.5 F (36.9 C)] 98.1 F (36.7 C) (06/15 0624) Pulse Rate:  [85-98] 85 (06/15 0624) Cardiac Rhythm:  [-] Normal sinus rhythm (06/15 0717) Resp:  [17-29] 20 (06/14 2047) BP: (105-130)/(67-86) 105/68 mmHg (06/15 0624) SpO2:  [92 %-100 %] 99 % (06/15 0624)  Intake/Output from previous day: 06/14 0701 - 06/15 0700 In: 630 [P.O.:480; IV Piggyback:150] Out: 975 [Urine:975]  General appearance: alert, cooperative and no distress Heart: regular rate and rhythm Lungs: clear to auscultation bilaterally Abdomen: soft, non-tender; bowel sounds normal; no masses,  no organomegaly Extremities: edema trace, some ecchymosis RLE Wound: clean and dry  Lab Results:  Recent Labs  05/15/16 0415 05/16/16 0406  WBC 9.8 11.1*  HGB 10.9* 11.0*  HCT 32.3* 33.2*  PLT 103* 129*   BMET:  Recent Labs  05/15/16 0415 05/16/16 0406  NA 135 136  K 3.8 4.2  CL 101 98*  CO2 29 30  GLUCOSE 113* 109*  BUN 11 14  CREATININE 0.85 0.98  CALCIUM 8.4* 8.7*    PT/INR:  Recent Labs  05/13/16 1345  LABPROT 16.1*  INR 1.27   ABG    Component Value Date/Time   PHART 7.314* 05/13/2016 2003   HCO3 24.9* 05/13/2016 2003   TCO2 25 05/14/2016 1641   ACIDBASEDEF 2.0 05/13/2016 2003   O2SAT 98.0 05/13/2016 2003   CBG (last 3)   Recent Labs  05/15/16 1650 05/15/16 2043 05/16/16 0700  GLUCAP 148* 176* 187*    Assessment/Plan: S/P Procedure(s) (LRB): CORONARY ARTERY BYPASS GRAFTING (CABG) X 4 UTILIZING THE  LEFT INTERNAL MAMMARY ARTERY AND ENDOSCOPICALLY HARVESTED RIGHT  GREATER SAPHENEOUS VEIN. (N/A) TRANSESOPHAGEAL ECHOCARDIOGRAM (TEE) (N/A)  1. CV- NSR, good HR control- continue Amiodarone, Lopressor, SBP at times as high as 130- will try low dose ACE if can tolerate 2. Pulm- no acute issues, off oxygen, continue IS 3. Renal- creatinine WNL, remains hypervolemic, continue Lasix 4. DM- new diagnosis, preoperative A1c of 10.2, not previous taking DM medications at home, education complete, will likely be discharge on insulin at discharge 5. Dispo- patient stable, d/c EPW, will work on diabetes medications for discharge, continue current care, possibly ready for d/c in AM   LOS: 6 days    Mcgrath, Robert 05/16/2016  Pulmonary status stable, cxr clear patient examined and medical record reviewed,agree with above note. [name] 05/16/2016

## 2016-05-17 ENCOUNTER — Telehealth: Payer: Self-pay | Admitting: Nurse Practitioner

## 2016-05-17 ENCOUNTER — Encounter (HOSPITAL_COMMUNITY): Payer: Self-pay | Admitting: Cardiothoracic Surgery

## 2016-05-17 LAB — GLUCOSE, CAPILLARY: Glucose-Capillary: 174 mg/dL — ABNORMAL HIGH (ref 65–99)

## 2016-05-17 MED ORDER — OXYCODONE HCL 5 MG PO TABS: 5.0000 mg | ORAL_TABLET | ORAL | Status: DC | PRN

## 2016-05-17 MED ORDER — ASPIRIN 325 MG PO TBEC: 325.0000 mg | DELAYED_RELEASE_TABLET | Freq: Every day | ORAL | Status: DC

## 2016-05-17 MED ORDER — GUAIFENESIN ER 600 MG PO TB12: 600.0000 mg | ORAL_TABLET | Freq: Two times a day (BID) | ORAL | Status: DC | PRN

## 2016-05-17 MED ORDER — ACETAMINOPHEN 500 MG PO TABS: 1000.0000 mg | ORAL_TABLET | Freq: Four times a day (QID) | ORAL | Status: AC | PRN

## 2016-05-17 MED ORDER — METOPROLOL TARTRATE 25 MG PO TABS: 12.5000 mg | ORAL_TABLET | Freq: Two times a day (BID) | ORAL | Status: DC

## 2016-05-17 MED ORDER — ATORVASTATIN CALCIUM 80 MG PO TABS
80.0000 mg | ORAL_TABLET | Freq: Every day | ORAL | Status: DC
Start: 2016-05-17 — End: 2016-07-31

## 2016-05-17 MED ORDER — POTASSIUM CHLORIDE CRYS ER 20 MEQ PO TBCR: 20.0000 meq | EXTENDED_RELEASE_TABLET | Freq: Every day | ORAL | Status: DC

## 2016-05-17 MED ORDER — NICOTINE 21 MG/24HR TD PT24: 21.0000 mg | MEDICATED_PATCH | Freq: Every day | TRANSDERMAL | Status: DC

## 2016-05-17 MED ORDER — METFORMIN HCL 500 MG PO TABS: 500.0000 mg | ORAL_TABLET | Freq: Two times a day (BID) | ORAL | Status: DC

## 2016-05-17 MED ORDER — FUROSEMIDE 40 MG PO TABS: 40.0000 mg | ORAL_TABLET | Freq: Every day | ORAL | Status: DC

## 2016-05-17 MED ORDER — BLOOD GLUCOSE METER KIT: PACK | Status: AC

## 2016-05-17 MED ORDER — INSULIN PEN NEEDLE 31G X 6 MM MISC: 1.0000 [IU] | Freq: Every day | Status: AC

## 2016-05-17 MED ORDER — AMIODARONE HCL 200 MG PO TABS: 200.0000 mg | ORAL_TABLET | Freq: Two times a day (BID) | ORAL | Status: DC

## 2016-05-17 MED ORDER — LISINOPRIL 2.5 MG PO TABS: 2.5000 mg | ORAL_TABLET | Freq: Every day | ORAL | Status: DC

## 2016-05-17 MED ORDER — INSULIN DETEMIR 100 UNIT/ML FLEXPEN: 20.0000 [IU] | PEN_INJECTOR | Freq: Every day | SUBCUTANEOUS | Status: DC

## 2016-05-17 NOTE — Telephone Encounter (Signed)
New message      TCM appt made on 05-22-16 with Robert FredricksonLori Mcgrath

## 2016-05-17 NOTE — Progress Notes (Signed)
      [address]       [place] [id number]             [phone]      4 Days Post-Op Procedure(s) (LRB): CORONARY ARTERY BYPASS GRAFTING (CABG) X 4 UTILIZING THE LEFT INTERNAL MAMMARY ARTERY AND ENDOSCOPICALLY HARVESTED RIGHT  GREATER SAPHENEOUS VEIN. (N/A) TRANSESOPHAGEAL ECHOCARDIOGRAM (TEE) (N/A)   Subjective:  [name] has no complaints.  Feeling much better and states he is ready to go home.  Objective: Vital signs in last 24 hours: Temp:  [97.5 F (36.4 C)-98.9 F (37.2 C)] 98.9 F (37.2 C) (06/16 0458) Pulse Rate:  [84-101] 87 (06/16 0458) Cardiac Rhythm:  [-] Normal sinus rhythm (06/15 1900) Resp:  [18-20] 18 (06/16 0458) BP: (105-121)/(57-76) 105/65 mmHg (06/16 0458) SpO2:  [97 %-100 %] 97 % (06/16 0458) Weight:  [204 lb 11.2 oz (92.851 kg)] 204 lb 11.2 oz (92.851 kg) (06/16 0458)  Intake/Output from previous day: 06/15 0701 - 06/16 0700 In: 1200 [P.O.:1200] Out: 1050 [Urine:1050]  General appearance: alert, cooperative and no distress Heart: regular rate and rhythm Lungs: clear to auscultation bilaterally Abdomen: soft, non-tender; bowel sounds normal; no masses,  no organomegaly Extremities: edema trace Wound: clean and dry  Lab Results:  Recent Labs  05/15/16 0415 05/16/16 0406  WBC 9.8 11.1*  HGB 10.9* 11.0*  HCT 32.3* 33.2*  PLT 103* 129*   BMET:  Recent Labs  05/15/16 0415 05/16/16 0406  NA 135 136  K 3.8 4.2  CL 101 98*  CO2 29 30  GLUCOSE 113* 109*  BUN 11 14  CREATININE 0.85 0.98  CALCIUM 8.4* 8.7*    PT/INR: No results for input(s): LABPROT, INR in the last 72 hours. ABG    Component Value Date/Time   PHART 7.314* 05/13/2016 2003   HCO3 24.9* 05/13/2016 2003   TCO2 25 05/14/2016 1641   ACIDBASEDEF 2.0 05/13/2016 2003   O2SAT 98.0 05/13/2016 2003   CBG (last 3)   Recent Labs  05/16/16 1606 05/16/16 2247 05/17/16 0624  GLUCAP 191* 151* 174*    Assessment/Plan: S/P Procedure(s) (LRB): CORONARY  ARTERY BYPASS GRAFTING (CABG) X 4 UTILIZING THE LEFT INTERNAL MAMMARY ARTERY AND ENDOSCOPICALLY HARVESTED RIGHT  GREATER SAPHENEOUS VEIN. (N/A) TRANSESOPHAGEAL ECHOCARDIOGRAM (TEE) (N/A)  1. CV- maintaining NSR- continue Amiodarone, Lopressor, Lisinopril 2. Pulm- no acute issues, continue IS 3. Renal- creatinine has been WNL, continue lasix  4. DM- uncontrolled, will d/c patient metformin and once daily levemir 5. Dispo- patient stable, will d/c patient home today   LOS: 7 days    [name] 05/17/2016

## 2016-05-17 NOTE — Progress Notes (Signed)
Pt discharged to home with wife and son at bedside.  Leg surgical wound CDI and sternal wound CDI.  Denies pain.  Vitals stable.  Pt received discharge instructions.  Verbalized understanding.  [facility].  Pt has all belongings including insulin pen instruction packet and "Living Well with DM" information booklet.  No other questions or concerns.  Pt discharged via wheel chair to personal car.  Barrie LymeVance, Derica Leiber E RN 05/17/2016  1100

## 2016-05-17 NOTE — Discharge Instructions (Signed)
Diabetes Mellitus and Food It is important for you to manage your blood sugar (glucose) level. Your blood glucose level can be greatly affected by what you eat. Eating healthier foods in the appropriate amounts throughout the day at about the same time each day will help you control your blood glucose level. It can also help slow or prevent worsening of your diabetes mellitus. Healthy eating may even help you improve the level of your blood pressure and reach or maintain a healthy weight.  General recommendations for healthful eating and cooking habits include:  Eating meals and snacks regularly. Avoid going long periods of time without eating to lose weight.  Eating a diet that consists mainly of plant-based foods, such as fruits, vegetables, nuts, legumes, and whole grains.  Using low-heat cooking methods, such as baking, instead of high-heat cooking methods, such as deep frying. Work with your dietitian to make sure you understand how to use the Nutrition Facts information on food labels. HOW CAN FOOD AFFECT ME? Carbohydrates Carbohydrates affect your blood glucose level more than any other type of food. Your dietitian will help you determine how many carbohydrates to eat at each meal and teach you how to count carbohydrates. Counting carbohydrates is important to keep your blood glucose at a healthy level, especially if you are using insulin or taking certain medicines for diabetes mellitus. Alcohol Alcohol can cause sudden decreases in blood glucose (hypoglycemia), especially if you use insulin or take certain medicines for diabetes mellitus. Hypoglycemia can be a life-threatening condition. Symptoms of hypoglycemia (sleepiness, dizziness, and disorientation) are similar to symptoms of having too much alcohol.  If your health care provider has given you approval to drink alcohol, do so in moderation and use the following guidelines:  Women should not have more than one drink per day, and men  should not have more than two drinks per day. One drink is equal to:  12 oz of beer.  5 oz of wine.  1 oz of hard liquor.  Do not drink on an empty stomach.  Keep yourself hydrated. Have water, diet soda, or unsweetened iced tea.  Regular soda, juice, and other mixers might contain a lot of carbohydrates and should be counted. WHAT FOODS ARE NOT RECOMMENDED? As you make food choices, it is important to remember that all foods are not the same. Some foods have fewer nutrients per serving than other foods, even though they might have the same number of calories or carbohydrates. It is difficult to get your body what it needs when you eat foods with fewer nutrients. Examples of foods that you should avoid that are high in calories and carbohydrates but low in nutrients include:  Trans fats (most processed foods list trans fats on the Nutrition Facts label).  Regular soda.  Juice.  Candy.  Sweets, such as cake, pie, doughnuts, and cookies.  Fried foods. WHAT FOODS CAN I EAT? Eat nutrient-rich foods, which will nourish your body and keep you healthy. The food you should eat also will depend on several factors, including:  The calories you need.  The medicines you take.  Your weight.  Your blood glucose level.  Your blood pressure level.  Your cholesterol level. You should eat a variety of foods, including:  Protein.  Lean cuts of meat.  Proteins low in saturated fats, such as fish, egg whites, and beans. Avoid processed meats.  Fruits and vegetables.  Fruits and vegetables that may help control blood glucose levels, such as apples, mangoes, and  yams.  Dairy products.  Choose fat-free or low-fat dairy products, such as milk, yogurt, and cheese.  Grains, bread, pasta, and rice.  Choose whole grain products, such as multigrain bread, whole oats, and brown rice. These foods may help control blood pressure.  Fats.  Foods containing healthful fats, such as nuts,  avocado, olive oil, canola oil, and fish. DOES EVERYONE WITH DIABETES MELLITUS HAVE THE SAME MEAL PLAN? Because every person with diabetes mellitus is different, there is not one meal plan that works for everyone. It is very important that you meet with a dietitian who will help you create a meal plan that is just right for you.   This information is not intended to replace advice given to you by your health care provider. Make sure you discuss any questions you have with your health care provider.   Document Released: 08/15/2005 Document Revised: 12/09/2014 Document Reviewed: 10/15/2013 Elsevier Interactive Patient Education 2016 [employer]  Basic Carbohydrate Counting for Diabetes Mellitus Carbohydrate counting is a method for keeping track of the amount of carbohydrates you eat. Eating carbohydrates naturally increases the level of sugar (glucose) in your blood, so it is important for you to know the amount that is okay for you to have in every meal. Carbohydrate counting helps keep the level of glucose in your blood within normal limits. The amount of carbohydrates allowed is different for every person. A dietitian can help you calculate the amount that is right for you. Once you know the amount of carbohydrates you can have, you can count the carbohydrates in the foods you want to eat. Carbohydrates are found in the following foods:  Grains, such as breads and cereals.  Dried beans and soy products.  Starchy vegetables, such as potatoes, peas, and corn.  Fruit and fruit juices.  Milk and yogurt.  Sweets and snack foods, such as cake, cookies, candy, chips, soft drinks, and fruit drinks. CARBOHYDRATE COUNTING There are two ways to count the carbohydrates in your food. You can use either of the methods or a combination of both. Reading the "Nutrition Facts" on Packaged Food The "Nutrition Facts" is an area that is included on the labels of almost all packaged food and beverages in  the [place]. It includes the serving size of that food or beverage and information about the nutrients in each serving of the food, including the grams (g) of carbohydrate per serving.  Decide the number of servings of this food or beverage that you will be able to eat or drink. Multiply that number of servings by the number of grams of carbohydrate that is listed on the label for that serving. The total will be the amount of carbohydrates you will be having when you eat or drink this food or beverage. Learning Standard Serving Sizes of Food When you eat food that is not packaged or does not include "Nutrition Facts" on the label, you need to measure the servings in order to count the amount of carbohydrates.A serving of most carbohydrate-rich foods contains about 15 g of carbohydrates. The following list includes serving sizes of carbohydrate-rich foods that provide 15 g ofcarbohydrate per serving:   1 slice of bread (1 oz) or 1 six-inch tortilla.    of a hamburger bun or English muffin.  4-6 crackers.   cup unsweetened dry cereal.    cup hot cereal.   cup rice or pasta.    cup mashed potatoes or  of a large baked potato.  1 cup  fresh fruit or one small piece of fruit.    cup canned or frozen fruit or fruit juice.  1 cup milk.   cup plain fat-free yogurt or yogurt sweetened with artificial sweeteners.   cup cooked dried beans or starchy vegetable, such as peas, corn, or potatoes.  Decide the number of standard-size servings that you will eat. Multiply that number of servings by 15 (the grams of carbohydrates in that serving). For example, if you eat 2 cups of strawberries, you will have eaten 2 servings and 30 g of carbohydrates (2 servings x 15 g = 30 g). For foods such as soups and casseroles, in which more than one food is mixed in, you will need to count the carbohydrates in each food that is included. EXAMPLE OF CARBOHYDRATE COUNTING Sample Dinner  3 oz  chicken breast.   cup of brown rice.   cup of corn.  1 cup milk.   1 cup strawberries with sugar-free whipped topping.  Carbohydrate Calculation Step 1: Identify the foods that contain carbohydrates:   Rice.   Corn.   Milk.   Strawberries. Step 2:Calculate the number of servings eaten of each:   2 servings of rice.   1 serving of corn.   1 serving of milk.   1 serving of strawberries. Step 3: Multiply each of those number of servings by 15 g:   2 servings of rice x 15 g = 30 g.   1 serving of corn x 15 g = 15 g.   1 serving of milk x 15 g = 15 g.   1 serving of strawberries x 15 g = 15 g. Step 4: Add together all of the amounts to find the total grams of carbohydrates eaten: 30 g + 15 g + 15 g + 15 g = 75 g.   This information is not intended to replace advice given to you by your health care provider. Make sure you discuss any questions you have with your health care provider.   Document Released: 11/18/2005 Document Revised: 12/09/2014 Document Reviewed: 10/15/2013 Elsevier Interactive Patient Education 2016 [employer]  Coronary Artery Bypass Grafting, Care After Refer to this sheet in the next few weeks. These instructions provide you with information on caring for yourself after your procedure. Your health care provider may also give you more specific instructions. Your treatment has been planned according to current medical practices, but problems sometimes occur. Call your health care provider if you have any problems or questions after your procedure. WHAT TO EXPECT AFTER THE PROCEDURE Recovery from surgery will be different for everyone. Some people feel well after 3 or 4 weeks, while for others it takes longer. After your procedure, it is typical to have the following:  Nausea and a lack of appetite.   Constipation.  Weakness and fatigue.   Depression or irritability.   Pain or discomfort at your incision site. HOME CARE  INSTRUCTIONS  Take medicines only as directed by your health care provider. Do not stop taking medicines or start any new medicines without first checking with your health care provider.  Take your pulse as directed by your health care provider.  Perform deep breathing as directed by your health care provider. If you were given a device called an incentive spirometer, use it to practice deep breathing several times a day. Support your chest with a pillow or your arms when you take deep breaths or cough.  Keep incision areas clean, dry, and protected. Remove or  change any bandages (dressings) only as directed by your health care provider. You may have skin adhesive strips over the incision areas. Do not take the strips off. They will fall off on their own.  Check incision areas daily for any swelling, redness, or drainage.  If incisions were made in your legs, do the following:  Avoid crossing your legs.   Avoid sitting for long periods of time. Change positions every 30 minutes.   Elevate your legs when you are sitting.  Wear compression stockings as directed by your health care provider. These stockings help keep blood clots from forming in your legs.  Take showers once your health care provider approves. Until then, only take sponge baths. Pat incisions dry. Do not rub incisions with a washcloth or towel. Do not take baths, swim, or use a hot tub until your health care provider approves.  Eat foods that are high in fiber, such as raw fruits and vegetables, whole grains, beans, and nuts. Meats should be lean cut. Avoid canned, processed, and fried foods.  Drink enough fluid to keep your urine clear or pale yellow.  Weigh yourself every day. This helps identify if you are retaining fluid that may make your heart and lungs work harder.  Rest and limit activity as directed by your health care provider. You may be instructed to:  Stop any activity at once if you have chest pain,  shortness of breath, irregular heartbeats, or dizziness. Get help right away if you have any of these symptoms.  Move around frequently for short periods or take short walks as directed by your health care provider. Increase your activities gradually. You may need physical therapy or cardiac rehabilitation to help strengthen your muscles and build your endurance.  Avoid lifting, pushing, or pulling anything heavier than 10 lb (4.5 kg) for at least 6 weeks after surgery.  Do not drive until your health care provider approves.  Ask your health care provider when you may return to work.  Ask your health care provider when you may resume sexual activity.  Keep all follow-up visits as directed by your health care provider. This is important. SEEK MEDICAL CARE IF:  You have swelling, redness, increasing pain, or drainage at the site of an incision.  You have a fever.  You have swelling in your ankles or legs.  You have pain in your legs.   You gain 2 or more pounds (0.9 kg) a day.  You are nauseous or vomit.  You have diarrhea. SEEK IMMEDIATE MEDICAL CARE IF:  You have chest pain that goes to your jaw or arms.  You have shortness of breath.   You have a fast or irregular heartbeat.   You notice a "clicking" in your breastbone (sternum) when you move.   You have numbness or weakness in your arms or legs.  You feel dizzy or light-headed.  MAKE SURE YOU:  Understand these instructions.  Will watch your condition.  Will get help right away if you are not doing well or get worse.   This information is not intended to replace advice given to you by your health care provider. Make sure you discuss any questions you have with your health care provider.   Document Released: 06/07/2005 Document Revised: 12/09/2014 Document Reviewed: 04/27/2013 Elsevier Interactive Patient Education 2016 [employer]  Endoscopic Saphenous Vein Harvesting, Care After Refer to this sheet  in the next few weeks. These instructions provide you with information on caring for yourself after your  procedure. Your health care provider may also give you more specific instructions. Your treatment has been planned according to current medical practices, but problems sometimes occur. Call your health care provider if you have any problems or questions after your procedure. HOME CARE INSTRUCTIONS Medicine  Take whatever pain medicine your surgeon prescribes. Follow the directions carefully. Do not take over-the-counter pain medicine unless your surgeon says it is okay. Some pain medicine can cause bleeding problems for several weeks after surgery.  Follow your surgeon's instructions about driving. You will probably not be permitted to drive after heart surgery.  Take any medicines your surgeon prescribes. Any medicines you took before your heart surgery should be checked with your health care provider before you start taking them again. Wound care  If your surgeon has prescribed an elastic bandage or stocking, ask how long you should wear it.  Check the area around your surgical cuts (incisions) whenever your bandages (dressings) are changed. Look for any redness or swelling.  You will need to return to have the stitches (sutures) or staples taken out. Ask your surgeon when to do that.  Ask your surgeon when you can shower or bathe. Activity  Try to keep your legs raised when you are sitting.  Do any exercises your health care providers have given you. These may include deep breathing exercises, coughing, walking, or other exercises. SEEK MEDICAL CARE IF:  You have any questions about your medicines.  You have more leg pain, especially if your pain medicine stops working.  New or growing bruises develop on your leg.  Your leg swells, feels tight, or becomes red.  You have numbness in your leg. SEEK IMMEDIATE MEDICAL CARE IF:  Your pain gets much worse.  Blood or fluid leaks  from any of the incisions.  Your incisions become warm, swollen, or red.  You have chest pain.  You have trouble breathing.  You have a fever.  You have more pain near your leg incision. MAKE SURE YOU:  Understand these instructions.  Will watch your condition.  Will get help right away if you are not doing well or get worse.   This information is not intended to replace advice given to you by your health care provider. Make sure you discuss any questions you have with your health care provider.   Document Released: 07/31/2011 Document Revised: 12/09/2014 Document Reviewed: 07/31/2011 Elsevier Interactive Patient Education [employer]

## 2016-05-20 NOTE — Telephone Encounter (Signed)
LM TO CALL BACK ./CY 

## 2016-05-21 ENCOUNTER — Encounter: Payer: Self-pay | Admitting: Nurse Practitioner

## 2016-05-21 NOTE — Telephone Encounter (Signed)
[facility].  Patient understands to follow up with provider Norma FredricksonLori Gerhardt, NP [facility]/ Patient understands discharge instructions? yes Patient understands medications and regiment? yes Patient understands to bring all medications to this visit? yes

## 2016-05-22 ENCOUNTER — Other Ambulatory Visit: Payer: Self-pay | Admitting: *Deleted

## 2016-05-22 ENCOUNTER — Encounter: Payer: Self-pay | Admitting: Nurse Practitioner

## 2016-05-22 ENCOUNTER — Ambulatory Visit (INDEPENDENT_AMBULATORY_CARE_PROVIDER_SITE_OTHER): Payer: BLUE CROSS/BLUE SHIELD | Admitting: Nurse Practitioner

## 2016-05-22 VITALS — BP 128/78 | HR 90 | Ht 69.0 in | Wt 198.2 lb

## 2016-05-22 DIAGNOSIS — I25119 Atherosclerotic heart disease of native coronary artery with unspecified angina pectoris: Secondary | ICD-10-CM | POA: Diagnosis not present

## 2016-05-22 DIAGNOSIS — Z720 Tobacco use: Secondary | ICD-10-CM

## 2016-05-22 DIAGNOSIS — I10 Essential (primary) hypertension: Secondary | ICD-10-CM

## 2016-05-22 DIAGNOSIS — I214 Non-ST elevation (NSTEMI) myocardial infarction: Secondary | ICD-10-CM

## 2016-05-22 DIAGNOSIS — Z951 Presence of aortocoronary bypass graft: Secondary | ICD-10-CM | POA: Diagnosis not present

## 2016-05-22 LAB — BASIC METABOLIC PANEL
BUN: 20 mg/dL (ref 7–25)
CO2: 27 mmol/L (ref 20–31)
Calcium: 9.6 mg/dL (ref 8.6–10.3)
Chloride: 97 mmol/L — ABNORMAL LOW (ref 98–110)
Creat: 1.07 mg/dL (ref 0.60–1.35)
Glucose, Bld: 147 mg/dL — ABNORMAL HIGH (ref 65–99)
Potassium: 4.9 mmol/L (ref 3.5–5.3)
Sodium: 139 mmol/L (ref 135–146)

## 2016-05-22 LAB — CBC
HCT: 38.2 % — ABNORMAL LOW (ref 38.5–50.0)
Hemoglobin: 13 g/dL — ABNORMAL LOW (ref 13.2–17.1)
MCH: 30.8 pg (ref 27.0–33.0)
MCHC: 34 g/dL (ref 32.0–36.0)
MCV: 90.5 fL (ref 80.0–100.0)
MPV: 9.8 fL (ref 7.5–12.5)
Platelets: 440 10*3/uL — ABNORMAL HIGH (ref 140–400)
RBC: 4.22 MIL/uL (ref 4.20–5.80)
RDW: 13 % (ref 11.0–15.0)
WBC: 13.1 10*3/uL — ABNORMAL HIGH (ref 3.8–10.8)

## 2016-05-22 MED ORDER — AMIODARONE HCL 200 MG PO TABS: 200.0000 mg | ORAL_TABLET | Freq: Every day | ORAL | Status: DC

## 2016-05-22 NOTE — Progress Notes (Signed)
CARDIOLOGY OFFICE NOTE  Date:  05/22/2016    Duffy Rhody Date of Birth: Jan 31, 1971 Medical Record #[id number]  PCP:  Mickie Hillier, MD  Cardiologist:  Oval Linsey (NEW)    Chief Complaint  Patient presents with  . Coronary Artery Disease  . Hyperlipidemia  . Hypertension    Post hospital visit - seen for Dr. Oval Linsey    History of Present Illness: [name] is a 45 y.o. male who presents today for a post hospital visit. Seen for Dr. Oval Linsey.   He has a history of nicotine abuse and poorly controlled diabetes with a pre operative A1c of 10.5. He presented to the ED earlier this month with complaints of sudden onset chest pain with associated shortness of breath. Work up showed non-specific EKG changes and mildly elevated cardiac enzymes. The patient left the ED AMA after his chest pain resolved. He again presented to [facility] ED on 05/09/2016 with similar complaints. At that time he was agreeable to further workup. He underwent a stress test which showed ischemic changes in the inferior and inferolateral wall. Cardiac catheterization was also done and showed significant multivessel CAD with preserved EF. It was felt he should be admitted and he was placed on a Heparin drip. TCTS consult was obtained for coronary bypass procedure.  The patient was evaluated by Dr. Prescott Gum who was in agreement the patient would require CABG procedure.  He was taken to the operating room on 05/13/2016. He underwent CABG x 4 utilizing LIMA to LAD, SVG to Diagonal, SVG to OM, and SVG to PDA. He also underwent endoscopic harvest of greater saphenous vein from the right leg. He had atrial fib - placed on amiodarone.   Comes back today. Here with his wife. He is doing well. Has only been home 4 days. Generalized soreness. Some tingling in the fingers of his right hand. Shoulders sore. Not short of breath. Has stopping smoking since day of surgery. Seems very motivated to make changes. To see  PCP next week. Monitoring his sugar. Has changed his diet considerably.    Past Medical History  Diagnosis Date  . Diabetes mellitus without complication (HCC)     diet controlled  . Hypertension     h/o of HTN, on no meds now.    Past Surgical History  Procedure Laterality Date  . Appendectomy    . Knee arthroscopy Left   . Ganglion cyst excision Left 10/12/2015    Procedure: EXCISION MUCOID TUMOR LEFT MIDDLE FINGER ;  Surgeon: Daryll Brod, MD;  Location: [facility];  Service: Orthopedics;  Laterality: Left;  Left middle finger.  . Cardiac catheterization N/A 05/10/2016    Procedure: Left Heart Cath and Coronary Angiography;  Surgeon: Jettie Booze, MD;  Location: [facility];  Service: Cardiovascular;  Laterality: N/A;  . Coronary artery bypass graft N/A 05/13/2016    Procedure: CORONARY ARTERY BYPASS GRAFTING (CABG) X 4 UTILIZING THE LEFT INTERNAL MAMMARY ARTERY AND ENDOSCOPICALLY HARVESTED RIGHT  GREATER SAPHENEOUS VEIN.;  Surgeon: Ivin Poot, MD;  Location: [facility];  Service: Open Heart Surgery;  Laterality: N/A;  LIMA TO LAD SVG to Diagonal SVG to OM SVG to PDA  . Tee without cardioversion N/A 05/13/2016    Procedure: TRANSESOPHAGEAL ECHOCARDIOGRAM (TEE);  Surgeon: Ivin Poot, MD;  Location: [facility];  Service: Open Heart Surgery;  Laterality: N/A;     Medications: Current Outpatient Prescriptions  Medication Sig Dispense Refill  . acetaminophen (  TYLENOL) 500 MG tablet Take 2 tablets (1,000 mg total) by mouth every 6 (six) hours as needed for mild pain or fever. 30 tablet 0  . amiodarone (PACERONE) 200 MG tablet Take 1 tablet (200 mg total) by mouth daily. 60 tablet 1  . aspirin EC 325 MG EC tablet Take 1 tablet (325 mg total) by mouth daily. 30 tablet 0  . atorvastatin (LIPITOR) 80 MG tablet Take 1 tablet (80 mg total) by mouth daily at 6 PM. 30 tablet 3  . BAYER CONTOUR NEXT TEST test strip USE TO TEST BLOOD SUGAR QID  0  . blood glucose meter  kit and supplies Dispense based on patient and insurance preference. Use up to four times daily as directed. (FOR ICD-9 250.00, 250.01). 1 each 0  . Dextromethorphan-Guaifenesin (MUCINEX DM PO) Take 1 tablet by mouth daily.    . furosemide (LASIX) 40 MG tablet Take 1 tablet (40 mg total) by mouth daily. For 7 Days 7 tablet 0  . glucosamine-chondroitin 500-400 MG tablet Take 1 tablet by mouth daily.    . Insulin Detemir (LEVEMIR FLEXPEN) 100 UNIT/ML Pen Inject 20 Units into the skin daily at 10 pm. 15 mL 11  . Insulin Pen Needle 31G X 6 MM MISC 1 Units by Does not apply route at bedtime. Please use clean needle every insulin administration 100 each 0  . lisinopril (PRINIVIL,ZESTRIL) 2.5 MG tablet Take 1 tablet (2.5 mg total) by mouth daily. 30 tablet 3  . metFORMIN (GLUCOPHAGE) 500 MG tablet Take 1 tablet (500 mg total) by mouth 2 (two) times daily with a meal. 60 tablet 3  . metoprolol tartrate (LOPRESSOR) 25 MG tablet Take 0.5 tablets (12.5 mg total) by mouth 2 (two) times daily. 30 tablet 3  . MICROLET LANCETS MISC USE TO TEST BLOOD SUGAR QID  0  . oxyCODONE (OXY IR/ROXICODONE) 5 MG immediate release tablet Take 1-2 tablets (5-10 mg total) by mouth every 3 (three) hours as needed for severe pain. 30 tablet 0  . potassium chloride SA (K-DUR,KLOR-CON) 20 MEQ tablet Take 1 tablet (20 mEq total) by mouth daily. For 7 Days 7 tablet 0   No current facility-administered medications for this visit.    Allergies: No Known Allergies  Social History: The patient  reports that he has been smoking.  He does not have any smokeless tobacco history on file. He reports that he uses illicit drugs (Marijuana). He reports that he does not drink alcohol.   Family History: The patient's family history includes Diabetes in his brother.   Review of Systems: Please see the history of present illness.   Otherwise, the review of systems is positive for none.   All other systems are reviewed and negative.    Physical Exam: VS:  BP 128/78 mmHg  Pulse 90  Ht 5' 9" (1.753 m)  Wt 198 lb 3.2 oz (89.903 kg)  BMI 29.26 kg/m2  SpO2 97% .  BMI Body mass index is 29.26 kg/(m^2).  Wt Readings from Last 3 Encounters:  05/22/16 198 lb 3.2 oz (89.903 kg)  05/17/16 204 lb 11.2 oz (92.851 kg)  05/08/16 215 lb (97.523 kg)    General: Pleasant. He is alert and in no acute distress. Looks older than his stated age. HEENT: Normal. Neck: Supple, no JVD, carotid bruits, or masses noted.  Cardiac: Regular rate and rhythm. No murmurs, rubs, or gallops. No edema. Still has CT sutures in place. Sternum looks good. Vein harvesting of the right leg looks  good.  Respiratory:  Lungs are clear to auscultation bilaterally with normal work of breathing.  GI: Soft and nontender.  MS: No deformity or atrophy. Gait and ROM intact. Skin: Warm and dry. Color is normal.  Neuro:  Strength and sensation are intact and no gross focal deficits noted.  Psych: Alert, appropriate and with normal affect.   LABORATORY DATA:  EKG:  EKG is ordered today. This demonstrates NSR with prolonged QT, inferior Q's.  Lab Results  Component Value Date   WBC 11.1* 05/16/2016   HGB 11.0* 05/16/2016   HCT 33.2* 05/16/2016   PLT 129* 05/16/2016   GLUCOSE 109* 05/16/2016   CHOL 239* 05/10/2016   TRIG 427* 05/10/2016   HDL 26* 05/10/2016   LDLCALC UNABLE TO CALCULATE IF TRIGLYCERIDE OVER 400 mg/dL 05/10/2016   NA 136 05/16/2016   K 4.2 05/16/2016   CL 98* 05/16/2016   CREATININE 0.98 05/16/2016   BUN 14 05/16/2016   CO2 30 05/16/2016   INR 1.27 05/13/2016   HGBA1C 10.2* 05/09/2016    BNP (last 3 results) No results for input(s): BNP in the last 8760 hours.  ProBNP (last 3 results) No results for input(s): PROBNP in the last 8760 hours.   Other Studies Reviewed Today:  Echo Study Conclusions from 05/2016  - Left ventricle: The cavity size was normal. There was mild  concentric hypertrophy. Systolic function was  normal. The  estimated ejection fraction was in the range of 55% to 60%. Wall  motion was normal; there were no regional wall motion  abnormalities. The transmitral flow pattern was normal. Left  ventricular diastolic function parameters were normal. - Aortic valve: Trileaflet; normal thickness, mildly calcified  leaflets. - Mitral valve: There was trivial regurgitation.  Procedures    Left Heart Cath and Coronary Angiography    Conclusion     Prox RCA to Mid RCA lesion, 80% stenosed. Nondominant vessel.  Ost Cx to Prox Cx lesion, 80% stenosed.  Mid Cx-1 lesion, 90% stenosed.  Mid Cx-2 lesion, 75% stenosed.  Ost 2nd Mrg to 2nd Mrg lesion, 95% stenosed. Small vessel.  Ostial LAD to diastalLM lesion, 90% stenosed. Ulcerated lesion.  Mid LAD lesion, 80% stenosed.  The left ventricular systolic function is normal.  Ost LPDA lesion, 90% stenosed.  Cardiac surgery consult for CABG. He will need to stop smoking and take his DM medicines regularly. WIll move to ICU given left main equivalent anatomy     Assessment/Plan: 1. NSTEMI with severe CAD - s/p CABG - early post op course complicated by AF. Has been home just a few days - doing well. Needs repeat labs today. Almost finished with diuretic and potassium. Only on aspirin. He is interested in cardiac rehab in [place] and says they will be calling him. Already walking a mile a day. Reminded about lifting restrictions and avoidance of extreme heat/temperature.   2. Uncontrolled DM - seems very motivated and has already made significant dietary changes.  3. HTN - BP ok on current regimen  4. HLD - on high dose statin   5. Tobacco abuse - hopefully he will be able to continue to abstain going forward.   6. PAF - on amiodarone - QT prolonged - will cut this back to just one a day. Remains in NSR. Would recheck EKG on return.   Current medicines are reviewed with the patient today.  The patient does not have  concerns regarding medicines other than what has been noted above.  The following changes  have been made:  See above.  Labs/ tests ordered today include:    Orders Placed This Encounter  Procedures  . Basic metabolic panel  . CBC  . EKG 12-Lead     Disposition:   FU with Dr. Oval Linsey with fasting labs and repeat EKG in 4 to 6 weeks.    Patient is agreeable to this plan and will call if any problems develop in the interim.   Signed: Burtis Junes, RN, ANP-C 05/22/2016 2:39 PM  [facility] [address] [place], [place]  [address] Phone: [phone] Fax: [phone]

## 2016-05-22 NOTE — Patient Instructions (Addendum)
We will be checking the following labs today - BMET and CBC   Medication Instructions:    Continue with your current medicines.     Testing/Procedures To Be Arranged:  N/A  Follow-Up:   See DrDuke Salvia.. [place] in 4 weeks with fasting labs and repeat EKG    Other Special Instructions:   N/A    If you need a refill on your cardiac medications before your next appointment, please call your pharmacy.   Call the [facility] office at [phone] if you have any questions, problems or concerns.

## 2016-05-24 ENCOUNTER — Ambulatory Visit (INDEPENDENT_AMBULATORY_CARE_PROVIDER_SITE_OTHER): Payer: Self-pay

## 2016-05-24 DIAGNOSIS — Z951 Presence of aortocoronary bypass graft: Secondary | ICD-10-CM

## 2016-05-24 DIAGNOSIS — Z4802 Encounter for removal of sutures: Secondary | ICD-10-CM

## 2016-05-24 NOTE — Progress Notes (Signed)
Removed 2 sutures from chest tube incision sites with no signs of infection and patient tolerated well. 

## 2016-05-30 ENCOUNTER — Encounter: Payer: Self-pay | Admitting: Family Medicine

## 2016-05-30 ENCOUNTER — Ambulatory Visit (INDEPENDENT_AMBULATORY_CARE_PROVIDER_SITE_OTHER): Payer: BLUE CROSS/BLUE SHIELD | Admitting: Family Medicine

## 2016-05-30 VITALS — BP 128/82 | Ht 69.0 in | Wt 204.2 lb

## 2016-05-30 DIAGNOSIS — E785 Hyperlipidemia, unspecified: Secondary | ICD-10-CM | POA: Diagnosis not present

## 2016-05-30 DIAGNOSIS — E119 Type 2 diabetes mellitus without complications: Secondary | ICD-10-CM | POA: Diagnosis not present

## 2016-05-30 DIAGNOSIS — I2510 Atherosclerotic heart disease of native coronary artery without angina pectoris: Secondary | ICD-10-CM

## 2016-05-30 DIAGNOSIS — I10 Essential (primary) hypertension: Secondary | ICD-10-CM

## 2016-05-30 DIAGNOSIS — I2583 Coronary atherosclerosis due to lipid rich plaque: Secondary | ICD-10-CM

## 2016-05-30 NOTE — Progress Notes (Signed)
   Subjective:    Patient ID: [name], male    DOB: 10/03/1971, 45 y.o.   MRN: [id number] Patient arrives office with numerous concerns. HPI  Patient arrives for follow up from hospitalization for heart attack and open heart surgery.. Complete hospital record reviewed in presence of patient next  Patient admits to poor self-care in recent years. Has known history of diabetes. Had become compliant with his medication.  Now on long-term insulin injections plus metformin. Handling it well.  Patient now more motivated since he's had triple bypass surgery. He claims he has stopped smoking for good.  Compliant cholesterol medication. No obvious side effects. Handling well.  Claims compliance with blood pressure medication. Watching salt intake.  No low sugar splells    Review of Systems No headache, no major weight loss or weight gain, no chest pain no back pain abdominal pain no change in bowel habits complete ROS otherwise negative     Objective:   Physical Exam  Alert vital stable blood pressure good on repeat. HEENT normal. Lungs clear heart rare rhythm. Ankles without edema      Assessment & Plan:  Impression 1 type 2 diabetes suboptimum control discussed now improving on insulin to maintain #2 hypertension clinically improved #3 hyperlipidemia recently initiated Lipitor. Claims compliance. #4 coronary artery disease new diagnosis #5 smoking discussed patient claims has stopped plan appropriate blood work. And office visit. Diet exercise discussed. WSL

## 2016-06-10 ENCOUNTER — Telehealth: Payer: Self-pay | Admitting: Family Medicine

## 2016-06-10 ENCOUNTER — Other Ambulatory Visit: Payer: Self-pay | Admitting: Cardiothoracic Surgery

## 2016-06-10 DIAGNOSIS — Z951 Presence of aortocoronary bypass graft: Secondary | ICD-10-CM

## 2016-06-10 NOTE — Telephone Encounter (Signed)
Tiffany Johnson, nurse case manager with [employer] called to check if patPhineas Mcgrath has had follow up here after his MI & CABG x4 His A1C was 10.5 in the hospital  Explained that we have seen patient since his surgery, that we are attempting to manage his diabetes & that if at some point Dr. Brett CanalesSteve feels necessary to refer to endocrin that we would be happy to handle that for the patient  She states that she had to call to leave her name & number in case we ever needed help with managing the patient's care  Robert Semeniffany Johnson, [facility] RN - [phone]

## 2016-06-12 ENCOUNTER — Ambulatory Visit (INDEPENDENT_AMBULATORY_CARE_PROVIDER_SITE_OTHER): Payer: Self-pay | Admitting: Cardiothoracic Surgery

## 2016-06-12 ENCOUNTER — Ambulatory Visit
Admission: RE | Admit: 2016-06-12 | Discharge: 2016-06-12 | Disposition: A | Discharge status: Home / Self Care | Payer: BLUE CROSS/BLUE SHIELD | Source: Ambulatory Visit | Attending: Cardiothoracic Surgery | Admitting: Cardiothoracic Surgery

## 2016-06-12 ENCOUNTER — Encounter: Payer: Self-pay | Admitting: Cardiothoracic Surgery

## 2016-06-12 VITALS — BP 116/77 | HR 68 | Resp 20 | Ht 69.0 in | Wt 208.0 lb

## 2016-06-12 DIAGNOSIS — Z951 Presence of aortocoronary bypass graft: Secondary | ICD-10-CM

## 2016-06-12 NOTE — Progress Notes (Signed)
PCP is Mickie Hillier, MD Referring Provider is Mikey Kirschner, MD  Chief Complaint  Patient presents with  . Routine Post Op    f/u from surgery with CXR s/p Coronary artery bypass grafting x4 on 05/13/16    HPI: The patient returns with chest x-ray for one month follow-up after urgent multivessel CABG. The patient did well after surgery. He developed transient atrial fibrillation which converted sinus rhythm on oral amiodarone. He was discharged home on postop day 4 in sinus rhythm. He has done well his discharge with improved strength and exercise tolerance. He is walking over 2 miles daily. He has had no recurrent angina. He is completely stop smoking. His diabetes is under good control and he is on a heart healthy diet. Surgical incisions all healing well. He has not required any narcotics.  Chest x-ray today shows clear lung fields, no pleural effusion and sternal wires intact. Cardiac silhouette normal.  Past Medical History  Diagnosis Date  . Diabetes mellitus without complication (HCC)     diet controlled  . Hypertension     h/o of HTN, on no meds now.    Past Surgical History  Procedure Laterality Date  . Appendectomy    . Knee arthroscopy Left   . Ganglion cyst excision Left 10/12/2015    Procedure: EXCISION MUCOID TUMOR LEFT MIDDLE FINGER ;  Surgeon: Daryll Brod, MD;  Location: [facility];  Service: Orthopedics;  Laterality: Left;  Left middle finger.  . Cardiac catheterization N/A 05/10/2016    Procedure: Left Heart Cath and Coronary Angiography;  Surgeon: Jettie Booze, MD;  Location: [facility];  Service: Cardiovascular;  Laterality: N/A;  . Coronary artery bypass graft N/A 05/13/2016    Procedure: CORONARY ARTERY BYPASS GRAFTING (CABG) X 4 UTILIZING THE LEFT INTERNAL MAMMARY ARTERY AND ENDOSCOPICALLY HARVESTED RIGHT  GREATER SAPHENEOUS VEIN.;  Surgeon: Ivin Poot, MD;  Location: [facility];  Service: Open Heart Surgery;  Laterality: N/A;  LIMA  TO LAD SVG to Diagonal SVG to OM SVG to PDA  . Tee without cardioversion N/A 05/13/2016    Procedure: TRANSESOPHAGEAL ECHOCARDIOGRAM (TEE);  Surgeon: Ivin Poot, MD;  Location: [facility];  Service: Open Heart Surgery;  Laterality: N/A;    Family History  Problem Relation Age of Onset  . Diabetes Brother     Social History Social History  Substance Use Topics  . Smoking status: Current Every Day Smoker -- 2.00 packs/day  . Smokeless tobacco: None  . Alcohol Use: No    Current Outpatient Prescriptions  Medication Sig Dispense Refill  . acetaminophen (TYLENOL) 500 MG tablet Take 2 tablets (1,000 mg total) by mouth every 6 (six) hours as needed for mild pain or fever. 30 tablet 0  . amiodarone (PACERONE) 200 MG tablet Take 1 tablet (200 mg total) by mouth daily. 60 tablet 1  . aspirin EC 325 MG EC tablet Take 1 tablet (325 mg total) by mouth daily. 30 tablet 0  . atorvastatin (LIPITOR) 80 MG tablet Take 1 tablet (80 mg total) by mouth daily at 6 PM. 30 tablet 3  . BAYER CONTOUR NEXT TEST test strip USE TO TEST BLOOD SUGAR QID  0  . blood glucose meter kit and supplies Dispense based on patient and insurance preference. Use up to four times daily as directed. (FOR ICD-9 250.00, 250.01). 1 each 0  . Dextromethorphan-Guaifenesin (MUCINEX DM PO) Take 1 tablet by mouth daily.    Marland Kitchen glucosamine-chondroitin 500-400 MG tablet  Take 1 tablet by mouth daily.    . Insulin Detemir (LEVEMIR FLEXPEN) 100 UNIT/ML Pen Inject 20 Units into the skin daily at 10 pm. 15 mL 11  . Insulin Pen Needle 31G X 6 MM MISC 1 Units by Does not apply route at bedtime. Please use clean needle every insulin administration 100 each 0  . lisinopril (PRINIVIL,ZESTRIL) 2.5 MG tablet Take 1 tablet (2.5 mg total) by mouth daily. 30 tablet 3  . metFORMIN (GLUCOPHAGE) 500 MG tablet Take 1 tablet (500 mg total) by mouth 2 (two) times daily with a meal. 60 tablet 3  . metoprolol tartrate (LOPRESSOR) 25 MG tablet Take 0.5 tablets  (12.5 mg total) by mouth 2 (two) times daily. 30 tablet 3  . MICROLET LANCETS MISC USE TO TEST BLOOD SUGAR QID  0  . oxyCODONE (OXY IR/ROXICODONE) 5 MG immediate release tablet Take 1-2 tablets (5-10 mg total) by mouth every 3 (three) hours as needed for severe pain. 30 tablet 0   No current facility-administered medications for this visit.    No Known Allergies  Review of Systems  Appetite and sleeping habits are improved since discharge from hospital No problems with ankle edema or weight gain No fever or productive cough Successfully completely stop smoking  BP 116/77 mmHg  Pulse 68  Resp 20  Ht '5\' 9"'  (1.753 m)  Wt 208 lb (94.348 kg)  BMI 30.70 kg/m2  SpO2 97% Physical Exam  Alert and comfortable  Lungs clear  Sternum well-healed and stable  Heart rhythm regular without murmur or gallop  Abdomen soft  Extremities without edema, vein harvest site healing well   Diagnostic Tests: Chest x-ray clear  Impression: Patient doing extremely well one month after urgent CABG 4 He can resume driving and lifting up to 20 pounds. He will reduce his amiodarone to 200 mg daily and discontinue medication when current prescription runs out. He was given a return to work notice for August 1 with limitations on his lifting. He understands that he can lift more than 20 pounds at 3 months postop He will continue indefinitely his aspirin, statin, beta blocker, and ACE inhibitor.  Plan: Patient will return for any future cardiothoracic surgical issues. He understands the importance of diabetic control and smoking cessation for his future cardiac health. Len Childs, MD [facility] [phone]

## 2016-06-27 ENCOUNTER — Other Ambulatory Visit: Payer: Self-pay | Admitting: Physician Assistant

## 2016-06-27 ENCOUNTER — Ambulatory Visit (INDEPENDENT_AMBULATORY_CARE_PROVIDER_SITE_OTHER): Payer: BLUE CROSS/BLUE SHIELD | Admitting: Cardiology

## 2016-06-27 ENCOUNTER — Encounter (INDEPENDENT_AMBULATORY_CARE_PROVIDER_SITE_OTHER): Payer: Self-pay

## 2016-06-27 ENCOUNTER — Encounter: Payer: Self-pay | Admitting: Cardiology

## 2016-06-27 ENCOUNTER — Ambulatory Visit: Payer: BLUE CROSS/BLUE SHIELD | Admitting: Cardiovascular Disease

## 2016-06-27 ENCOUNTER — Telehealth: Payer: Self-pay | Admitting: Family Medicine

## 2016-06-27 VITALS — BP 120/74 | HR 79 | Ht 69.0 in | Wt 207.0 lb

## 2016-06-27 DIAGNOSIS — I9789 Other postprocedural complications and disorders of the circulatory system, not elsewhere classified: Secondary | ICD-10-CM | POA: Diagnosis not present

## 2016-06-27 DIAGNOSIS — I4891 Unspecified atrial fibrillation: Secondary | ICD-10-CM

## 2016-06-27 DIAGNOSIS — I25119 Atherosclerotic heart disease of native coronary artery with unspecified angina pectoris: Secondary | ICD-10-CM

## 2016-06-27 DIAGNOSIS — E785 Hyperlipidemia, unspecified: Secondary | ICD-10-CM

## 2016-06-27 DIAGNOSIS — I10 Essential (primary) hypertension: Secondary | ICD-10-CM

## 2016-06-27 NOTE — Telephone Encounter (Signed)
[facility] 06/27/16 

## 2016-06-27 NOTE — Telephone Encounter (Signed)
Contour Next test strips please for patient that was originally ordered in the Hospital. Last visit with Dr Brett Canales he was told to at least check it in the morning and before he went to be if he wanted to.   [place]

## 2016-06-27 NOTE — Telephone Encounter (Signed)
Spoke with patient's wife and informed her that according to epic testing strips have been refilled in epic. Patient wife verbalized understanding.

## 2016-06-27 NOTE — Progress Notes (Signed)
Clinical Summary [name] is a 45 y.o.male seen today for follow up of the following medical problems. Last seen by NP Servando Snare, this is our first visit together.   1. CAD - admit 05/2016 with NSTEMI, cath with multivessel CAD, s/p CABG 05/2016 with LIMA-LAD, SVG-Diag,SVG-OM, SVG-PDA.  - 05/2016 echo LVEF 55-60% - denies any chest pain. No SOB or DOE.  - compliant with meds - not interested in cardiac rehab. Walking several miles on his own daily  2. Postoperative afib - prolonged QT on most recent EKG, dose decreased last visit - set to stop amio when current Rx runs out per CT surgery.   3. HTN - compliant with meds - does not check regularly  4. Hyperlpidemia - has labs coming up with pcp  5.Tobacco abuse - stopped smoking after his MI  Past Medical History:  Diagnosis Date  . Diabetes mellitus without complication (HCC)    diet controlled  . Hypertension    h/o of HTN, on no meds now.     No Known Allergies   Current Outpatient Prescriptions  Medication Sig Dispense Refill  . acetaminophen (TYLENOL) 500 MG tablet Take 2 tablets (1,000 mg total) by mouth every 6 (six) hours as needed for mild pain or fever. 30 tablet 0  . amiodarone (PACERONE) 200 MG tablet Take 1 tablet (200 mg total) by mouth daily. 60 tablet 1  . aspirin EC 325 MG EC tablet Take 1 tablet (325 mg total) by mouth daily. 30 tablet 0  . atorvastatin (LIPITOR) 80 MG tablet Take 1 tablet (80 mg total) by mouth daily at 6 PM. 30 tablet 3  . BAYER CONTOUR NEXT TEST test strip USE TO TEST BLOOD SUGAR QID  0  . blood glucose meter kit and supplies Dispense based on patient and insurance preference. Use up to four times daily as directed. (FOR ICD-9 250.00, 250.01). 1 each 0  . Dextromethorphan-Guaifenesin (MUCINEX DM PO) Take 1 tablet by mouth daily.    Marland Kitchen glucosamine-chondroitin 500-400 MG tablet Take 1 tablet by mouth daily.    . Insulin Detemir (LEVEMIR FLEXPEN) 100 UNIT/ML Pen Inject 20 Units into  the skin daily at 10 pm. 15 mL 11  . Insulin Pen Needle 31G X 6 MM MISC 1 Units by Does not apply route at bedtime. Please use clean needle every insulin administration 100 each 0  . lisinopril (PRINIVIL,ZESTRIL) 2.5 MG tablet Take 1 tablet (2.5 mg total) by mouth daily. 30 tablet 3  . metFORMIN (GLUCOPHAGE) 500 MG tablet Take 1 tablet (500 mg total) by mouth 2 (two) times daily with a meal. 60 tablet 3  . metoprolol tartrate (LOPRESSOR) 25 MG tablet Take 0.5 tablets (12.5 mg total) by mouth 2 (two) times daily. 30 tablet 3  . MICROLET LANCETS MISC USE TO TEST BLOOD SUGAR QID  0  . oxyCODONE (OXY IR/ROXICODONE) 5 MG immediate release tablet Take 1-2 tablets (5-10 mg total) by mouth every 3 (three) hours as needed for severe pain. 30 tablet 0   No current facility-administered medications for this visit.      Past Surgical History:  Procedure Laterality Date  . APPENDECTOMY    . CARDIAC CATHETERIZATION N/A 05/10/2016   Procedure: Left Heart Cath and Coronary Angiography;  Surgeon: Jettie Booze, MD;  Location: [facility];  Service: Cardiovascular;  Laterality: N/A;  . CORONARY ARTERY BYPASS GRAFT N/A 05/13/2016   Procedure: CORONARY ARTERY BYPASS GRAFTING (CABG) X 4 UTILIZING THE LEFT  INTERNAL MAMMARY ARTERY AND ENDOSCOPICALLY HARVESTED RIGHT  GREATER SAPHENEOUS VEIN.;  Surgeon: Ivin Poot, MD;  Location: [facility];  Service: Open Heart Surgery;  Laterality: N/A;  LIMA TO LAD SVG to Diagonal SVG to OM SVG to PDA  . GANGLION CYST EXCISION Left 10/12/2015   Procedure: EXCISION MUCOID TUMOR LEFT MIDDLE FINGER ;  Surgeon: Daryll Brod, MD;  Location: [facility];  Service: Orthopedics;  Laterality: Left;  Left middle finger.  Marland Kitchen KNEE ARTHROSCOPY Left   . TEE WITHOUT CARDIOVERSION N/A 05/13/2016   Procedure: TRANSESOPHAGEAL ECHOCARDIOGRAM (TEE);  Surgeon: Ivin Poot, MD;  Location: [facility];  Service: Open Heart Surgery;  Laterality: N/A;     No Known  Allergies    Family History  Problem Relation Age of Onset  . Diabetes Brother      Social History [name] reports that he has been smoking.  He has been smoking about 2.00 packs per day. He does not have any smokeless tobacco history on file. [name] reports that he does not drink alcohol.   Review of Systems CONSTITUTIONAL: No weight loss, fever, chills, weakness or fatigue.  HEENT: Eyes: No visual loss, blurred vision, double vision or yellow sclerae.No hearing loss, sneezing, congestion, runny nose or sore throat.  SKIN: No rash or itching.  CARDIOVASCULAR: per HPI RESPIRATORY: No shortness of breath, cough or sputum.  GASTROINTESTINAL: No anorexia, nausea, vomiting or diarrhea. No abdominal pain or blood.  GENITOURINARY: No burning on urination, no polyuria NEUROLOGICAL: No headache, dizziness, syncope, paralysis, ataxia, numbness or tingling in the extremities. No change in bowel or bladder control.  MUSCULOSKELETAL: No muscle, back pain, joint pain or stiffness.  LYMPHATICS: No enlarged nodes. No history of splenectomy.  PSYCHIATRIC: No history of depression or anxiety.  ENDOCRINOLOGIC: No reports of sweating, cold or heat intolerance. No polyuria or polydipsia.  Marland Kitchen   Physical Examination Vitals:   06/27/16 0901  BP: 120/74  Pulse: 79   Vitals:   06/27/16 0901  Weight: 207 lb (93.9 kg)  Height: _0  (1.753 m)    Gen: resting comfortably, no acute distress HEENT: no scleral icterus, pupils equal round and reactive, no palptable cervical adenopathy,  CV: RRR, no m//r,g no jvd Resp: Clear to auscultation bilaterally GI: abdomen is soft, non-tender, non-distended, normal bowel sounds, no hepatosplenomegaly MSK: extremities are warm, no edema.  Skin: warm, no rash Neuro:  no focal deficits Psych: appropriate affect   Diagnostic Studies 05/2016 echo Study Conclusions  - Left ventricle: The cavity size was normal. There was mild   concentric hypertrophy.  Systolic function was normal. The   estimated ejection fraction was in the range of 55% to 60%. Wall   motion was normal; there were no regional wall motion   abnormalities. The transmitral flow pattern was normal. Left   ventricular diastolic function parameters were normal. - Aortic valve: Trileaflet; normal thickness, mildly calcified   leaflets. - Mitral valve: There was trivial regurgitation.  05/2016 Cath  Prox RCA to Mid RCA lesion, 80% stenosed. Nondominant vessel.  Ost Cx to Prox Cx lesion, 80% stenosed.  Mid Cx-1 lesion, 90% stenosed.  Mid Cx-2 lesion, 75% stenosed.  Ost 2nd Mrg to 2nd Mrg lesion, 95% stenosed. Small vessel.  Ostial LAD to diastalLM lesion, 90% stenosed. Ulcerated lesion.  Mid LAD lesion, 80% stenosed.  The left ventricular systolic function is normal.  Ost LPDA lesion, 90% stenosed.   05/2016  Carotid US Summary: The right internal carotid artery  exhibits 1-39% stenosis. The left internal carotid artery exhibits elevated velocities suggestive of upper range 40-59% stenosis. The left vertebral artery is patent with antegrade flow. Unable to visualize the right vertebral artery.  Bilateral ABIs are within normal limits at rest.      Prepared and Electronically Authenticated by Assessment and Plan  1. CAD - doing well after recent CABG - no current symptoms. Continue current meds. Change ASA to 32m daily  2. Postoperative afib - EKG in clinic shows NSR, he has completed his course of amio. No further intervention indicated  3. HTN - at goal, conitnue current meds  4. Hyperlipidemia - continue high dose statin in setting of known CAD   F/u 3 months   JArnoldo Lenis M.D.

## 2016-06-27 NOTE — Patient Instructions (Signed)
Medication Instructions:  DECREASE ASPIRIN TO 81 MG DAILY   Labwork: NONE  Testing/Procedures: NONE  Follow-Up: Your physician recommends that you schedule a follow-up appointment in: 3 MONTHS    Any Other Special Instructions Will Be Listed Below (If Applicable).     If you need a refill on your cardiac medications before your next appointment, please call your pharmacy.

## 2016-07-05 NOTE — Telephone Encounter (Signed)
Not sure why this showed up on cc charts column, lets do prn ref

## 2016-07-19 ENCOUNTER — Other Ambulatory Visit: Payer: Self-pay | Admitting: Physician Assistant

## 2016-07-22 DIAGNOSIS — E119 Type 2 diabetes mellitus without complications: Secondary | ICD-10-CM | POA: Diagnosis not present

## 2016-07-22 DIAGNOSIS — E785 Hyperlipidemia, unspecified: Secondary | ICD-10-CM | POA: Diagnosis not present

## 2016-07-23 LAB — HEPATIC FUNCTION PANEL
ALBUMIN: 4.5 g/dL (ref 3.5–5.5)
ALT: 24 IU/L (ref 0–44)
AST: 14 IU/L (ref 0–40)
Alkaline Phosphatase: 69 IU/L (ref 39–117)
BILIRUBIN TOTAL: 0.2 mg/dL (ref 0.0–1.2)
BILIRUBIN, DIRECT: 0.08 mg/dL (ref 0.00–0.40)
TOTAL PROTEIN: 6.5 g/dL (ref 6.0–8.5)

## 2016-07-23 LAB — BASIC METABOLIC PANEL
BUN / CREAT RATIO: 14 (ref 9–20)
BUN: 12 mg/dL (ref 6–24)
CALCIUM: 10.2 mg/dL (ref 8.7–10.2)
CHLORIDE: 104 mmol/L (ref 96–106)
CO2: 26 mmol/L (ref 18–29)
CREATININE: 0.85 mg/dL (ref 0.76–1.27)
GFR calc non Af Amer: 104 mL/min/{1.73_m2} (ref >59)
GFR, EST AFRICAN AMERICAN: 121 mL/min/{1.73_m2} (ref >59)
Glucose: 125 mg/dL — ABNORMAL HIGH (ref 65–99)
Potassium: 4.9 mmol/L (ref 3.5–5.2)
Sodium: 146 mmol/L — ABNORMAL HIGH (ref 134–144)

## 2016-07-23 LAB — LIPID PANEL
CHOL/HDL RATIO: 3.6 ratio (ref 0.0–5.0)
Cholesterol, Total: 120 mg/dL (ref 100–199)
HDL: 33 mg/dL — ABNORMAL LOW (ref >39)
LDL CALC: 56 mg/dL (ref 0–99)
Triglycerides: 155 mg/dL — ABNORMAL HIGH (ref 0–149)
VLDL Cholesterol Cal: 31 mg/dL (ref 5–40)

## 2016-07-31 ENCOUNTER — Encounter: Payer: Self-pay | Admitting: Family Medicine

## 2016-07-31 ENCOUNTER — Ambulatory Visit (INDEPENDENT_AMBULATORY_CARE_PROVIDER_SITE_OTHER): Payer: BLUE CROSS/BLUE SHIELD | Admitting: Family Medicine

## 2016-07-31 VITALS — BP 142/74 | Ht 69.0 in | Wt 208.0 lb

## 2016-07-31 DIAGNOSIS — E785 Hyperlipidemia, unspecified: Secondary | ICD-10-CM | POA: Diagnosis not present

## 2016-07-31 DIAGNOSIS — I10 Essential (primary) hypertension: Secondary | ICD-10-CM | POA: Diagnosis not present

## 2016-07-31 DIAGNOSIS — E119 Type 2 diabetes mellitus without complications: Secondary | ICD-10-CM | POA: Diagnosis not present

## 2016-07-31 LAB — POCT GLYCOSYLATED HEMOGLOBIN (HGB A1C): HEMOGLOBIN A1C: 5.8

## 2016-07-31 MED ORDER — LISINOPRIL 5 MG PO TABS: 5.0000 mg | ORAL_TABLET | Freq: Every day | ORAL | 1 refills | Status: DC

## 2016-07-31 MED ORDER — INSULIN DETEMIR 100 UNIT/ML FLEXPEN: 12.0000 [IU] | PEN_INJECTOR | Freq: Every day | SUBCUTANEOUS | 5 refills | Status: DC

## 2016-07-31 MED ORDER — METFORMIN HCL 500 MG PO TABS: 500.0000 mg | ORAL_TABLET | Freq: Two times a day (BID) | ORAL | 1 refills | Status: DC

## 2016-07-31 MED ORDER — METOPROLOL TARTRATE 25 MG PO TABS: 12.5000 mg | ORAL_TABLET | Freq: Two times a day (BID) | ORAL | 1 refills | Status: DC

## 2016-07-31 MED ORDER — ATORVASTATIN CALCIUM 80 MG PO TABS: 80.0000 mg | ORAL_TABLET | Freq: Every day | ORAL | 1 refills | Status: DC

## 2016-07-31 NOTE — Progress Notes (Signed)
   Subjective:    Patient ID: [name], male    DOB: 03/24/1971, 45 y.o.   MRN: [id number]  Diabetes  He presents for his follow-up diabetic visit. He has type 2 diabetes mellitus. Diabetic current diet: health conscious, has cut out a lot of sugar in diet. Exercise: tries to exercise, has went back to work.  He does not see a podiatrist.Eye exam is not current.  A1C today.  Results for orders placed or performed in visit on 07/31/16  POCT glycosylated hemoglobin (Hb A1C)  Result Value Ref Range   Hemoglobin A1C 5.8     Pt was walking steady before going bk to work  Blood pressure medicine and blood pressure levels reviewed today with patient. Compliant with blood pressure medicine. States does not miss a dose. No obvious side effects. Blood pressure generally good when checked elsewhere. Watching salt intake.    Patient continues to take lipid medication regularly. No obvious side effects from it. Generally does not miss a dose. Prior blood work results are reviewed with patient. Patient continues to work on fat intake in diet  Patient still not smoking. Exercising regularly         Review of Systems No headache, no major weight loss or weight gain, no chest pain no back pain abdominal pain no change in bowel habits complete ROS otherwise negative     Objective:   Physical Exam Alert vitals stable, NAD. Blood pressure good on repeat. HEENT normal. Lungs clear. Heart regular rate and rhythm. Ankles without edema diabetic foot exam within normal limits  impression hypertension suboptimal control discussed will increase medicine lisinopril. #2 hyperlipidemia much improved control on current medicines discussed to maintain #3 type 2 diabetes excellent control in fact tight. Back off insulin to 12 units rationale discussed. Maintain other medications follow-up as scheduled W[facility]       Assessment & Plan:

## 2016-08-02 ENCOUNTER — Other Ambulatory Visit: Payer: Self-pay | Admitting: Family Medicine

## 2016-08-29 ENCOUNTER — Other Ambulatory Visit: Payer: Self-pay | Admitting: Physician Assistant

## 2016-08-29 ENCOUNTER — Other Ambulatory Visit: Payer: Self-pay | Admitting: Family Medicine

## 2016-10-04 ENCOUNTER — Ambulatory Visit (INDEPENDENT_AMBULATORY_CARE_PROVIDER_SITE_OTHER): Payer: BLUE CROSS/BLUE SHIELD | Admitting: Cardiology

## 2016-10-04 VITALS — BP 124/60 | HR 82 | Ht 69.0 in | Wt 206.0 lb

## 2016-10-04 DIAGNOSIS — I10 Essential (primary) hypertension: Secondary | ICD-10-CM | POA: Diagnosis not present

## 2016-10-04 DIAGNOSIS — E782 Mixed hyperlipidemia: Secondary | ICD-10-CM

## 2016-10-04 DIAGNOSIS — I25119 Atherosclerotic heart disease of native coronary artery with unspecified angina pectoris: Secondary | ICD-10-CM

## 2016-10-04 DIAGNOSIS — Z23 Encounter for immunization: Secondary | ICD-10-CM | POA: Diagnosis not present

## 2016-10-04 NOTE — Patient Instructions (Signed)
Your physician recommends that you continue on your current medications as directed. Please refer to the Current Medication list given to you today.   Your physician recommends that you schedule a follow-up appointment in: 6 MONTHS with Dr. Wyline MoodBranch   Thanks for choosing [facility]!!!

## 2016-10-04 NOTE — Progress Notes (Signed)
Clinical Summary [name] is a 45 y.o.male seen today for follow up of the following medical problems.   1. CAD - admit 05/2016 with NSTEMI, cath with multivessel CAD, s/p CABG 05/2016 with LIMA-LAD, SVG-Diag,SVG-OM, SVG-PDA.  - 05/2016 echo LVEF 55-60%   - no recent chest pain. No SOB or DOE - compliant with meds  2. HTN - compliant with meds   3. 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 Diabetes mellitus without complication (HCC)    diet controlled  . Hypertension    h/o of HTN, on no meds now.     No Known Allergies   Current Outpatient Prescriptions  Medication Sig Dispense Refill  . acetaminophen (TYLENOL) 500 MG tablet Take 2 tablets (1,000 mg total) by mouth every 6 (six) hours as needed for mild pain or fever. 30 tablet 0  . aspirin 325 MG tablet Take 325 mg by mouth daily.    Marland Kitchen atorvastatin (LIPITOR) 80 MG tablet Take 1 tablet (80 mg total) by mouth daily at 6 PM. 90 tablet 1  . B-D UF III MINI PEN NEEDLES 31G X 5 MM MISC USE A CLEAN NEEDLE EVERY NIGHT AT BEDTIME TO INJECT INSULIN 100 each 5  . BAYER CONTOUR NEXT TEST test strip USE TO TEST BLOOD SUGAR FOUR TIMES DAILY 100 each 5  . blood glucose meter kit and supplies Dispense based on patient and insurance preference. Use up to four times daily as directed. (FOR ICD-9 250.00, 250.01). 1 each 0  . glucosamine-chondroitin 500-400 MG tablet Take 1 tablet by mouth daily.    . Insulin Detemir (LEVEMIR FLEXPEN) 100 UNIT/ML Pen Inject 12 Units into the skin daily at 10 pm. 15 mL 5  . Insulin Pen Needle 31G X 6 MM MISC 1 Units by Does not apply route at bedtime. Please use clean needle every insulin administration 100 each 0  . lisinopril (PRINIVIL,ZESTRIL) 5 MG tablet Take 1  tablet (5 mg total) by mouth daily. 90 tablet 1  . metFORMIN (GLUCOPHAGE) 500 MG tablet Take 1 tablet (500 mg total) by mouth 2 (two) times daily with a meal. 180 tablet 1  . metoprolol tartrate (LOPRESSOR) 25 MG tablet Take 0.5 tablets (12.5 mg total) by mouth 2 (two) times daily. 90 tablet 1  . MICROLET LANCETS MISC USE TO TEST BLOOD SUGAR FOUR TIMES DAILY 100 each 5   No current facility-administered medications for this visit.      Past Surgical History:  Procedure Laterality Date  . APPENDECTOMY    . CARDIAC CATHETERIZATION N/A 05/10/2016   Procedure: Left Heart Cath and Coronary Angiography;  Surgeon: Jettie Booze, MD;  Location: [facility];  Service: Cardiovascular;  Laterality: N/A;  . CORONARY ARTERY BYPASS GRAFT N/A 05/13/2016   Procedure: CORONARY ARTERY BYPASS GRAFTING (CABG) X 4 UTILIZING THE LEFT INTERNAL MAMMARY ARTERY AND ENDOSCOPICALLY HARVESTED RIGHT  GREATER SAPHENEOUS VEIN.;  Surgeon: Ivin Poot, MD;  Location: [facility];  Service: Open Heart Surgery;  Laterality: N/A;  LIMA TO LAD SVG to Diagonal SVG to OM SVG to PDA  . GANGLION CYST EXCISION Left 10/12/2015   Procedure: EXCISION MUCOID TUMOR LEFT MIDDLE FINGER ;  Surgeon: Daryll Brod, MD;  Location: [facility];  Service: Orthopedics;  Laterality: Left;  Left middle finger.  Marland Kitchen KNEE ARTHROSCOPY Left   . TEE WITHOUT CARDIOVERSION N/A 05/13/2016   Procedure: TRANSESOPHAGEAL ECHOCARDIOGRAM (TEE);  Surgeon: Ivin Poot, MD;  Location: [place];  Service: Open Heart Surgery;  Laterality: N/A;     No Known Allergies    Family History  Problem Relation Age of Onset  . Diabetes Brother      Social History [name] reports that he quit smoking about 4 months ago. He smoked 2.00 packs per day. He has never used smokeless tobacco. [name] reports that he does not drink alcohol.   Review of Systems CONSTITUTIONAL: No weight loss, fever, chills, weakness or fatigue.  HEENT: Eyes: No  visual loss, blurred vision, double vision or yellow sclerae.No hearing loss, sneezing, congestion, runny nose or sore throat.  SKIN: No rash or itching.  CARDIOVASCULAR: per hpi RESPIRATORY: No shortness of breath, cough or sputum.  GASTROINTESTINAL: No anorexia, nausea, vomiting or diarrhea. No abdominal pain or blood.  GENITOURINARY: No burning on urination, no polyuria NEUROLOGICAL: No headache, dizziness, syncope, paralysis, ataxia, numbness or tingling in the extremities. No change in bowel or bladder control.  MUSCULOSKELETAL: No muscle, back pain, joint pain or stiffness.  LYMPHATICS: No enlarged nodes. No history of splenectomy.  PSYCHIATRIC: No history of depression or anxiety.  ENDOCRINOLOGIC: No reports of sweating, cold or heat intolerance. No polyuria or polydipsia.  Marland Kitchen   Physical Examination Vitals:   10/04/16 0934  BP: 124/60  Pulse: 82   Vitals:   10/04/16 0934  Weight: 206 lb (93.4 kg)  Height: _0  (1.753 m)    Gen: resting comfortably, no acute distress HEENT: no scleral icterus, pupils equal round and reactive, no palptable cervical adenopathy,  CV: RRR, no m/r/g, no jvd Resp: Clear to auscultation bilaterally GI: abdomen is soft, non-tender, non-distended, normal bowel sounds, no hepatosplenomegaly MSK: extremities are warm, no edema.  Skin: warm, no rash Neuro:  no focal deficits Psych: appropriate affect   Diagnostic Studies  05/2016 echo Study Conclusions  - Left ventricle: The cavity size was normal. There was mild concentric hypertrophy. Systolic function was normal. The estimated ejection fraction was in the range of 55% to 60%. Wall motion was normal; there were no regional wall motion abnormalities. The transmitral flow pattern was normal. Left ventricular diastolic function parameters were normal. - Aortic valve: Trileaflet; normal thickness, mildly calcified leaflets. - Mitral valve: There was trivial  regurgitation.  05/2016 Cath  Prox RCA to Mid RCA lesion, 80% stenosed. Nondominant vessel.  Ost Cx to Prox Cx lesion, 80% stenosed.  Mid Cx-1 lesion, 90% stenosed.  Mid Cx-2 lesion, 75% stenosed.  Ost 2nd Mrg to 2nd Mrg lesion, 95% stenosed. Small vessel.  Ostial LAD to diastalLM lesion, 90% stenosed. Ulcerated lesion.  Mid LAD lesion, 80% stenosed.  The left ventricular systolic function is normal.  Ost LPDA lesion, 90% stenosed.   05/2016  Carotid US Summary: The right internal carotid artery exhibits 1-39% stenosis. The left internal carotid artery exhibits elevated velocities suggestive of upper range 40-59% stenosis. The left vertebral artery is patent with antegrade flow. Unable to visualize the right vertebral artery.  Bilateral ABIs are within normal limits at rest. Prepared and Electronically Authenticated by   Assessment and Plan   1. CAD - doing well after recent CABG - no symptoms. Continue current meds  2. HTN - he is at goal,  conitnue current meds  4. Hyperlipidemia - continue high dose statin in setting of known CAD - lipid are at goal       Arnoldo Lenis, M.D.

## 2016-10-06 ENCOUNTER — Encounter: Payer: Self-pay | Admitting: Cardiology

## 2016-12-03 ENCOUNTER — Encounter: Payer: Self-pay | Admitting: Family Medicine

## 2016-12-03 ENCOUNTER — Ambulatory Visit (INDEPENDENT_AMBULATORY_CARE_PROVIDER_SITE_OTHER): Payer: BLUE CROSS/BLUE SHIELD | Admitting: Family Medicine

## 2016-12-03 VITALS — BP 118/78 | Ht 69.0 in | Wt 202.4 lb

## 2016-12-03 DIAGNOSIS — I10 Essential (primary) hypertension: Secondary | ICD-10-CM | POA: Diagnosis not present

## 2016-12-03 DIAGNOSIS — A084 Viral intestinal infection, unspecified: Secondary | ICD-10-CM

## 2016-12-03 DIAGNOSIS — Z79899 Other long term (current) drug therapy: Secondary | ICD-10-CM

## 2016-12-03 DIAGNOSIS — E119 Type 2 diabetes mellitus without complications: Secondary | ICD-10-CM | POA: Diagnosis not present

## 2016-12-03 DIAGNOSIS — E785 Hyperlipidemia, unspecified: Secondary | ICD-10-CM

## 2016-12-03 LAB — POCT GLYCOSYLATED HEMOGLOBIN (HGB A1C): HEMOGLOBIN A1C: 5.7

## 2016-12-03 MED ORDER — METOPROLOL TARTRATE 25 MG PO TABS: 12.5000 mg | ORAL_TABLET | Freq: Two times a day (BID) | ORAL | 1 refills | Status: DC

## 2016-12-03 MED ORDER — ATORVASTATIN CALCIUM 80 MG PO TABS
80.0000 mg | ORAL_TABLET | Freq: Every day | ORAL | 1 refills | Status: DC
Start: 2016-12-03 — End: 2017-06-11

## 2016-12-03 MED ORDER — ONDANSETRON 4 MG PO TBDP: 4.0000 mg | ORAL_TABLET | Freq: Three times a day (TID) | ORAL | 0 refills | Status: DC | PRN

## 2016-12-03 MED ORDER — INSULIN DETEMIR 100 UNIT/ML FLEXPEN: 12.0000 [IU] | PEN_INJECTOR | Freq: Every day | SUBCUTANEOUS | 5 refills | Status: DC

## 2016-12-03 MED ORDER — LISINOPRIL 5 MG PO TABS: 5.0000 mg | ORAL_TABLET | Freq: Every day | ORAL | 1 refills | Status: DC

## 2016-12-03 MED ORDER — METFORMIN HCL 500 MG PO TABS: 500.0000 mg | ORAL_TABLET | Freq: Two times a day (BID) | ORAL | 1 refills | Status: DC

## 2016-12-03 NOTE — Progress Notes (Signed)
   Subjective:    Patient ID: [name], male    DOB: 09/16/1971, 46 y.o.   MRN: [id number] Patient arrives office for numerous concerns Diabetes  He presents for his follow-up diabetic visit. He has type 2 diabetes mellitus. Risk factors for coronary artery disease include diabetes mellitus. Current diabetic treatment includes insulin injections and oral agent (monotherapy). He is compliant with treatment all of the time. His weight is stable. He is following a diabetic diet. He has not had a previous visit with a dietitian. He does not see a podiatrist.Eye exam is not current.   Results for orders placed or performed in visit on 12/03/16  POCT glycosylated hemoglobin (Hb A1C)  Result Value Ref Range   Hemoglobin A1C 5.7    Sugars running high at times  abd bloated, 3 days duration of symptoms. Diminished energy. Diminished appetite. Intermittent abdominal cramping. vom multiple times, no diarrhea, no fever  htn  Patient continues to take lipid medication regularly. No obvious side effects from it. Generally does not miss a dose. Prior blood work results are reviewed with patient. Patient continues to work on fat intake in diet     Review of Systems    No headache, no major weight loss or weight gain, no chest pain no back pain abdominal pain no change in bowel habits complete ROS otherwise negative  Objective:   Physical Exam  Alert vitals stable, NAD. Blood pressure good on repeat. HEENT normal. Lungs clear. Heart regular rate and rhythm. Abdomen hyperactive bowel sounds diffuse mild tenderness no discrete tenderness      Assessment & Plan:  Impression 1 type 2 diabetes discussed excellent control to maintain same dose of insulin No. 2 hypertension controlled discussed compliance discussed maintain same dose meds #3 hyperlipidemia prior blood work reviewed maintain same pending new blood work #4 viral gastritis discussed including symptom care and management. Please see  orders. Diet exercise discussed check in 6 months for wellness plus chronic

## 2016-12-05 ENCOUNTER — Ambulatory Visit: Payer: BLUE CROSS/BLUE SHIELD | Admitting: Family Medicine

## 2017-01-26 ENCOUNTER — Other Ambulatory Visit: Payer: Self-pay | Admitting: Physician Assistant

## 2017-01-30 IMAGING — CR DG CHEST 1V PORT
1 series · 1 of 1 positions shown · non-contrast
Comparison: 05/08/2016.

CLINICAL DATA: Postop CABG.

EXAM:
PORTABLE CHEST 1 VIEW

[AP]
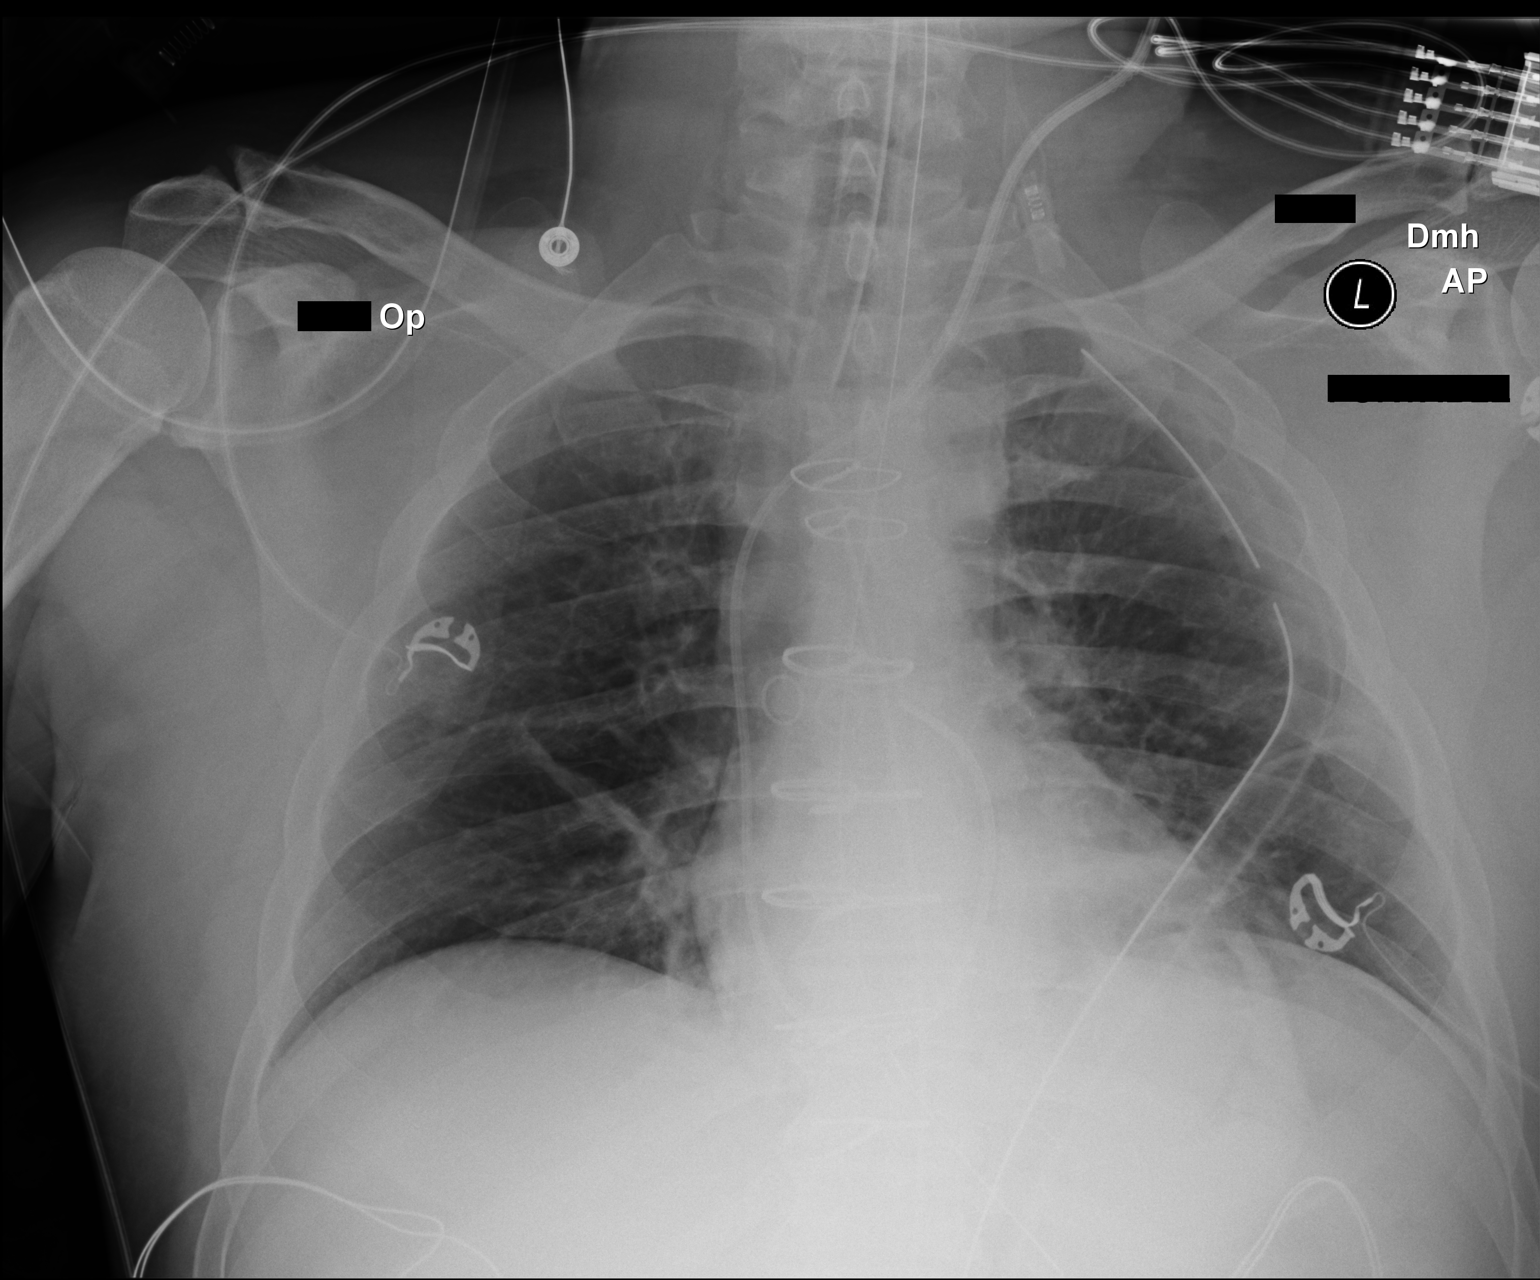

[1 of 1 positions shown; findings below may reference images not displayed]

FINDINGS: 0812 hours. Endotracheal tube tip is in the midtrachea. Enteric tube
projects below the diaphragm. Mediastinal drain and left chest tube
are in place. There is a left IJ Swan-Ganz catheter with its tip in
the main pulmonary artery or right ventricular outflow tract. Mild
atelectasis is present in both lung bases. There is no edema,
pneumothorax or significant pleural effusion. The heart size is
stable status post interval CABG.
IMPRESSION: No demonstrated complication following CABG.  Bibasilar atelectasis.

## 2017-02-25 ENCOUNTER — Other Ambulatory Visit: Payer: Self-pay | Admitting: Physician Assistant

## 2017-03-01 IMAGING — CR DG CHEST 2V
2 series · 2 of 2 positions shown · non-contrast
Comparison: May 16, 2016

CLINICAL DATA: Status post CABG on May 13, 2016.  Follow-up.

EXAM:
CHEST  2 VIEW

[w chest pa]
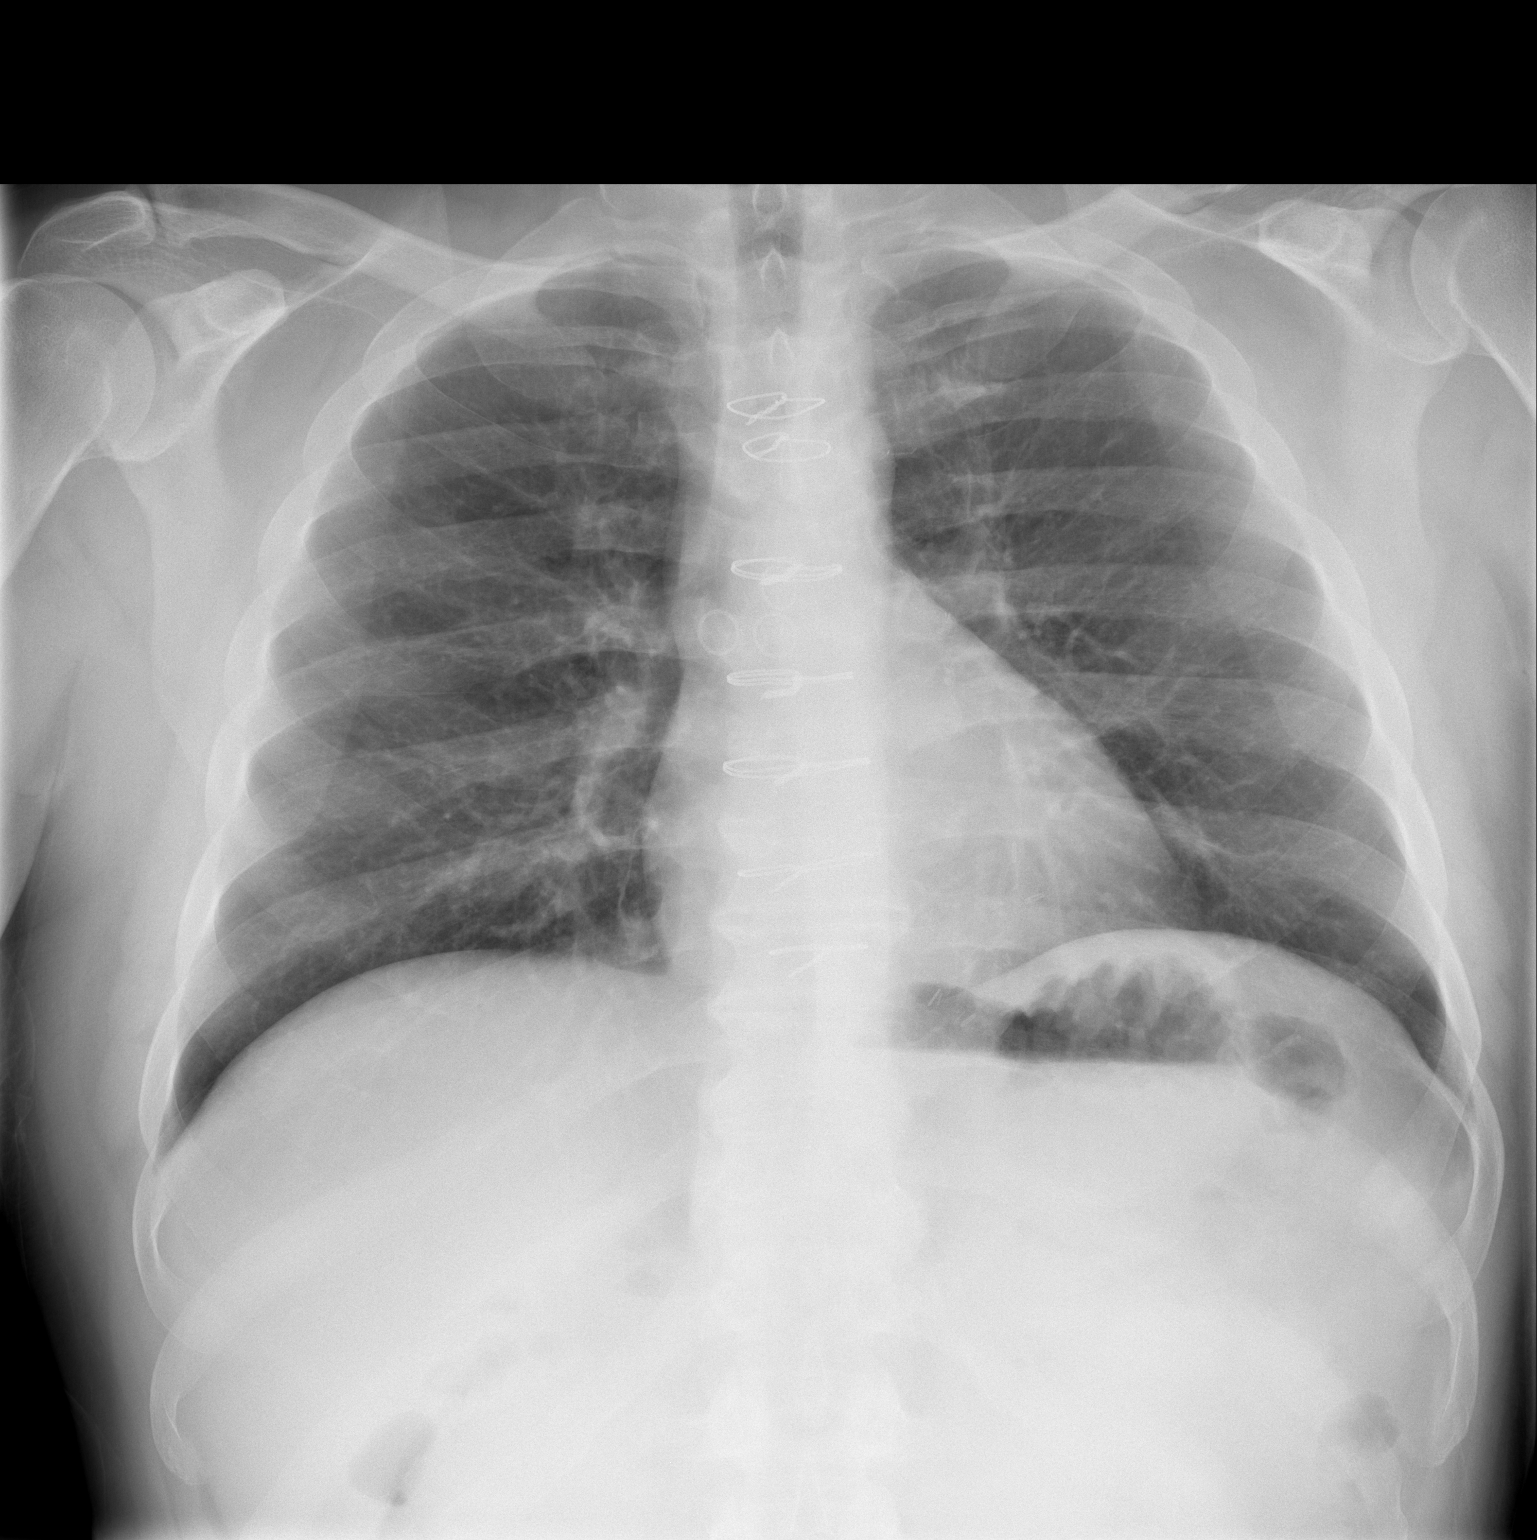

[w chest lat]
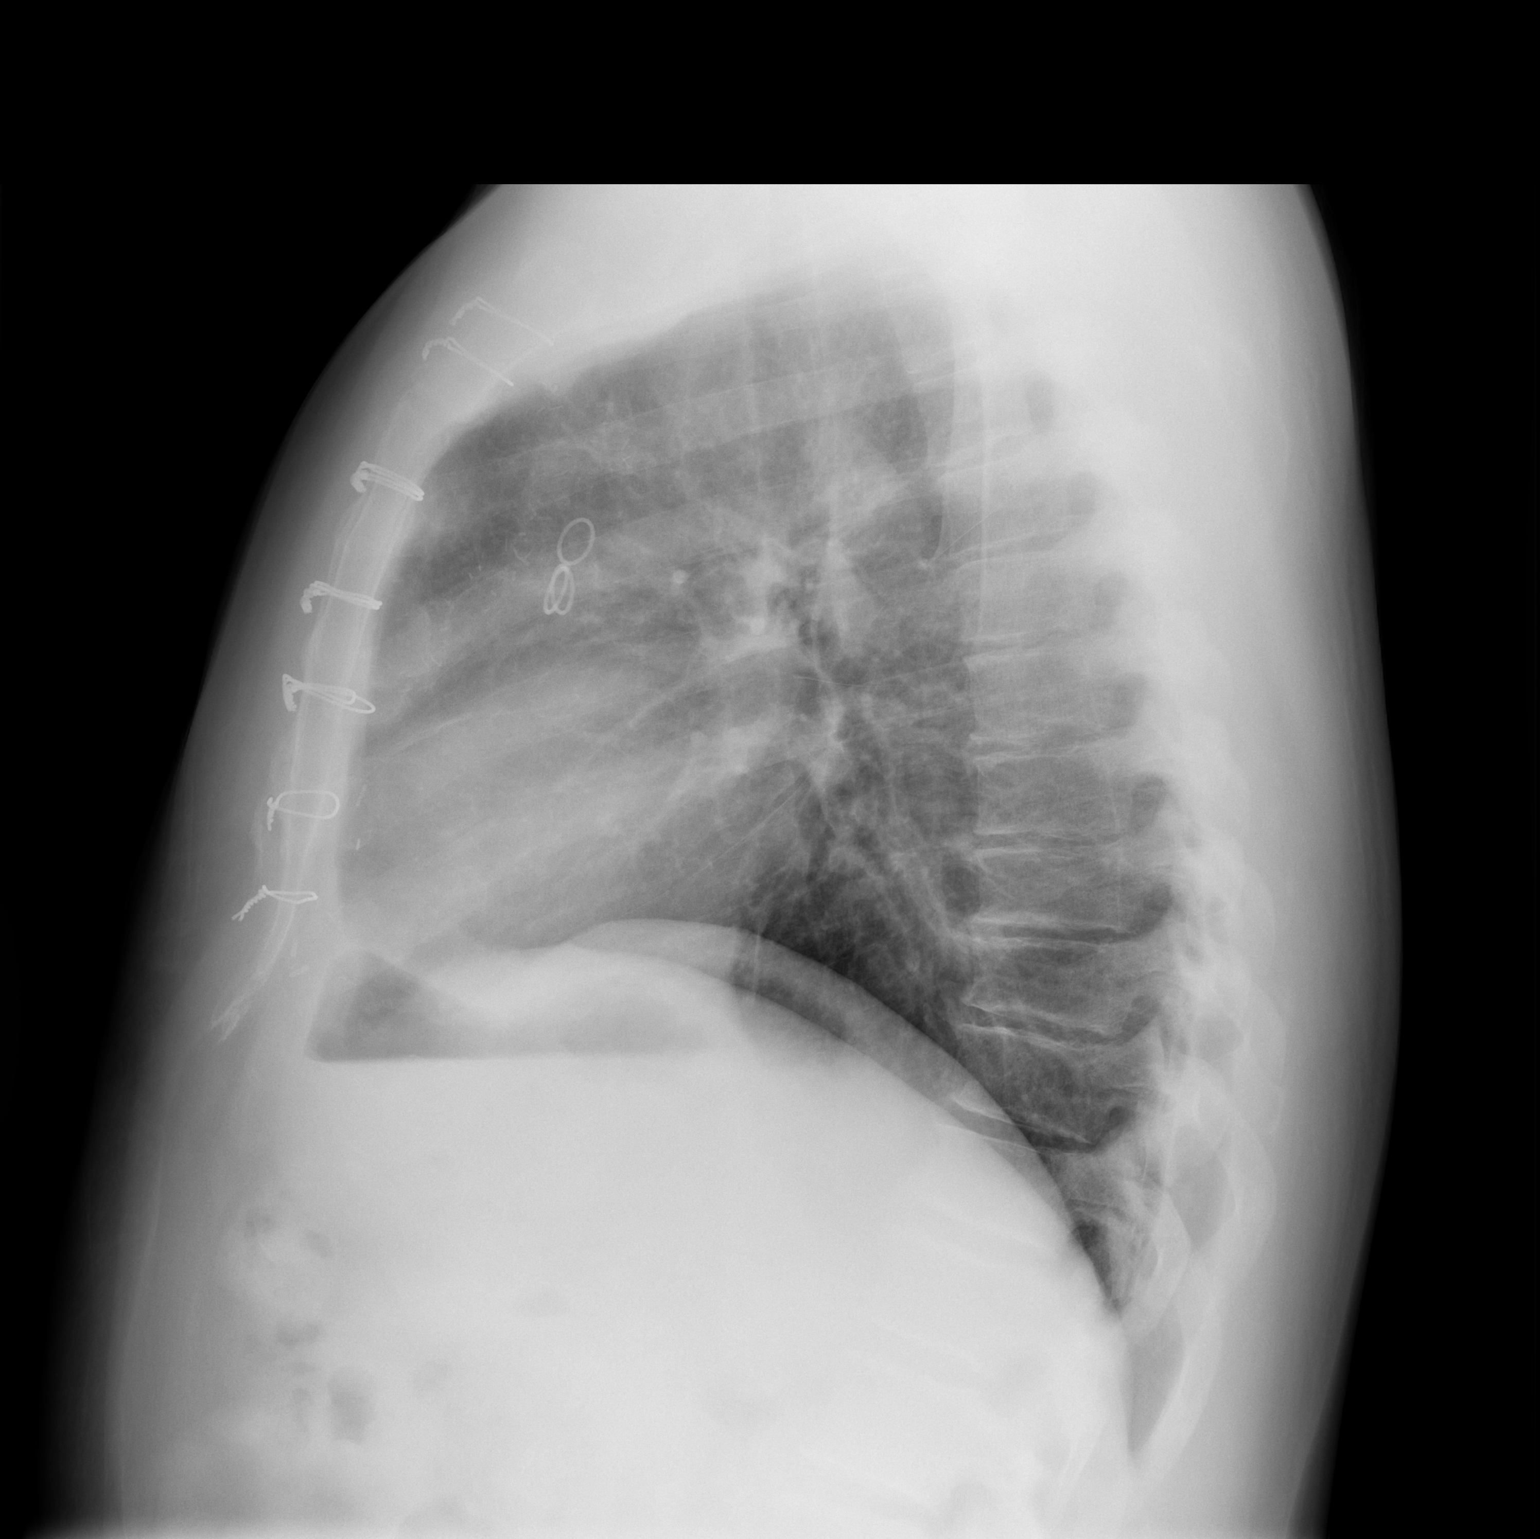

[2 of 2 positions shown; findings below may reference images not displayed]

FINDINGS: The heart size and mediastinal contours are stable. The patient
status post prior median sternotomy and CABG. Both lungs are clear.
The visualized skeletal structures are stable.
IMPRESSION: No active cardiopulmonary disease.

## 2017-03-24 ENCOUNTER — Ambulatory Visit (INDEPENDENT_AMBULATORY_CARE_PROVIDER_SITE_OTHER): Payer: BLUE CROSS/BLUE SHIELD | Admitting: Nurse Practitioner

## 2017-03-24 ENCOUNTER — Encounter: Payer: Self-pay | Admitting: Family Medicine

## 2017-03-24 ENCOUNTER — Encounter: Payer: Self-pay | Admitting: Nurse Practitioner

## 2017-03-24 VITALS — BP 122/82 | Temp 98.2°F | Ht 69.0 in | Wt 209.8 lb

## 2017-03-24 DIAGNOSIS — L03211 Cellulitis of face: Secondary | ICD-10-CM | POA: Diagnosis not present

## 2017-03-24 DIAGNOSIS — H00015 Hordeolum externum left lower eyelid: Secondary | ICD-10-CM

## 2017-03-24 MED ORDER — SULFACETAMIDE SODIUM 10 % OP SOLN: 1.0000 [drp] | OPHTHALMIC | 0 refills | Status: DC

## 2017-03-24 MED ORDER — AMOXICILLIN-POT CLAVULANATE 875-125 MG PO TABS: 1.0000 | ORAL_TABLET | Freq: Two times a day (BID) | ORAL | 0 refills | Status: DC

## 2017-03-24 NOTE — Progress Notes (Signed)
Subjective:  Presents for complaints of pain and swelling in the left lower eyelid for about a week. No specific history of injury. Works in an [employer] so is exposed to different chemicals and metal. No actual eye pain. No vision changes. No fever. Mild sore throat on the left side. Mild headache. Had a similar issue in his left eye several weeks ago where a bump came up patient was able to express some purulent drainage and resolved on its own. Fasting blood sugar this morning was slightly above his usual at 190.  Objective:   BP 122/82   Temp 98.2 F (36.8 C) (Oral)   Ht  (1.753 m)   Wt 209 lb 12.8 oz (95.2 kg)   BMI 30.98 kg/m  NAD. Alert, oriented. TMs very retracted more on the left, no erythema. Pharynx mildly injected with clear PND noted. Neck supple with mild soft anterior adenopathy slightly tender on the left. No preauricular adenopathy. Lungs clear. Heart regular rate rhythm. Conjunctiva clear bilaterally. Funduscopic grossly normal. No foreign body noted. Tangential lighting no obvious abrasions. Florescence stain applied. Exam normal. Mild edema and a tiny closed area in the center of the left lower eyelid with mild erythema and tenderness. Extends about 1 cm into the facial area. No tenderness around the upper eye. Limited to the lower eyelid.  Assessment:  Hordeolum externum of left lower eyelid  Cellulitis of face    Plan:   Meds ordered this encounter  Medications  . sulfacetamide (BLEPH-10) 10 % ophthalmic solution    Sig: Place 1 drop into the left eye every 2 (two) hours while awake. Then QID starting tomorrow    Dispense:  10 mL    Refill:  0    Order Specific Question:   Supervising Provider    Answer:   Merlyn Albert [[id number]]  . amoxicillin-clavulanate (AUGMENTIN) 875-125 MG tablet    Sig: Take 1 tablet by mouth 2 (two) times daily.    Dispense:  20 tablet    Refill:  0    Order Specific Question:   Supervising Provider    Answer:   Merlyn Albert [[id number]]   Warm compresses to the eye. Warning signs reviewed including fever worsening erythema tenderness and infection or any eye symptoms. Call back in 48-72 hours if no improvement, sooner if worse. Continue to monitor his blood sugars.

## 2017-03-24 NOTE — Patient Instructions (Signed)

## 2017-04-02 ENCOUNTER — Ambulatory Visit (INDEPENDENT_AMBULATORY_CARE_PROVIDER_SITE_OTHER): Payer: BLUE CROSS/BLUE SHIELD | Admitting: Cardiology

## 2017-04-02 ENCOUNTER — Encounter: Payer: Self-pay | Admitting: Cardiology

## 2017-04-02 VITALS — BP 116/76 | HR 77 | Ht 69.0 in | Wt 209.0 lb

## 2017-04-02 DIAGNOSIS — I2510 Atherosclerotic heart disease of native coronary artery without angina pectoris: Secondary | ICD-10-CM

## 2017-04-02 DIAGNOSIS — E782 Mixed hyperlipidemia: Secondary | ICD-10-CM

## 2017-04-02 DIAGNOSIS — I10 Essential (primary) hypertension: Secondary | ICD-10-CM | POA: Diagnosis not present

## 2017-04-02 NOTE — Progress Notes (Signed)
Clinical Summary [name] is a 46 y.o.male seen today for follow up of the following medical problems.   1. CAD - admit 05/2016 with NSTEMI, cath with multivessel CAD, s/p CABG 05/2016 with LIMA-LAD, SVG-Diag,SVG-OM, SVG-PDA.  - 05/2016 echo LVEF 55-60%    - denies any chest pain. No SOB/DOE - compliant with meds  2. HTN - he is compliant with meds   3. 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 Diabetes mellitus without complication (HCC)    diet controlled  . Hypertension    h/o of HTN, on no meds now.     No Known Allergies   Current Outpatient Prescriptions  Medication Sig Dispense Refill  . acetaminophen (TYLENOL) 500 MG tablet Take 2 tablets (1,000 mg total) by mouth every 6 (six) hours as needed for mild pain or fever. 30 tablet 0  . amoxicillin-clavulanate (AUGMENTIN) 875-125 MG tablet Take 1 tablet by mouth 2 (two) times daily. 20 tablet 0  . aspirin 325 MG tablet Take 325 mg by mouth daily.    Marland Kitchen atorvastatin (LIPITOR) 80 MG tablet Take 1 tablet (80 mg total) by mouth daily at 6 PM. 90 tablet 1  . B-D UF III MINI PEN NEEDLES 31G X 5 MM MISC USE A CLEAN NEEDLE EVERY NIGHT AT BEDTIME TO INJECT INSULIN 100 each 5  . BAYER CONTOUR NEXT TEST test strip USE TO TEST BLOOD SUGAR FOUR TIMES DAILY 100 each 5  . blood glucose meter kit and supplies Dispense based on patient and insurance preference. Use up to four times daily as directed. (FOR ICD-9 250.00, 250.01). 1 each 0  . glucosamine-chondroitin 500-400 MG tablet Take 1 tablet by mouth daily.    . Insulin Detemir (LEVEMIR FLEXPEN) 100 UNIT/ML Pen Inject 12 Units into the skin daily at 10 pm. 15 mL 5  . Insulin Pen Needle 31G X 6 MM MISC 1 Units by Does not apply route at bedtime. Please use clean needle every insulin administration 100 each 0  . lisinopril (PRINIVIL,ZESTRIL) 5 MG  tablet Take 1 tablet (5 mg total) by mouth daily. 90 tablet 1  . metFORMIN (GLUCOPHAGE) 500 MG tablet Take 1 tablet (500 mg total) by mouth 2 (two) times daily with a meal. 180 tablet 1  . metoprolol tartrate (LOPRESSOR) 25 MG tablet Take 0.5 tablets (12.5 mg total) by mouth 2 (two) times daily. 90 tablet 1  . MICROLET LANCETS MISC USE TO TEST BLOOD SUGAR FOUR TIMES DAILY 100 each 5  . ondansetron (ZOFRAN ODT) 4 MG disintegrating tablet Take 1 tablet (4 mg total) by mouth every 8 (eight) hours as needed for nausea or vomiting. 16 tablet 0  . sulfacetamide (BLEPH-10) 10 % ophthalmic solution Place 1 drop into the left eye every 2 (two) hours while awake. Then QID starting tomorrow 10 mL 0   No current facility-administered medications for this visit.      Past Surgical History:  Procedure Laterality Date  . APPENDECTOMY    . CARDIAC CATHETERIZATION N/A 05/10/2016   Procedure: Left Heart Cath and Coronary Angiography;  Surgeon: Jettie Booze, MD;  Location: [place] [facility];  Service: Cardiovascular;  Laterality: N/A;  . CORONARY ARTERY BYPASS GRAFT N/A 05/13/2016   Procedure: CORONARY ARTERY BYPASS GRAFTING (CABG) X 4 UTILIZING THE LEFT INTERNAL MAMMARY ARTERY AND  ENDOSCOPICALLY HARVESTED RIGHT  GREATER SAPHENEOUS VEIN.;  Surgeon: Ivin Poot, MD;  Location: [place];  Service: Open Heart Surgery;  Laterality: N/A;  LIMA TO LAD SVG to Diagonal SVG to OM SVG to PDA  . GANGLION CYST EXCISION Left 10/12/2015   Procedure: EXCISION MUCOID TUMOR LEFT MIDDLE FINGER ;  Surgeon: Daryll Brod, MD;  Location: [place];  Service: Orthopedics;  Laterality: Left;  Left middle finger.  Marland Kitchen KNEE ARTHROSCOPY Left   . TEE WITHOUT CARDIOVERSION N/A 05/13/2016   Procedure: TRANSESOPHAGEAL ECHOCARDIOGRAM (TEE);  Surgeon: Ivin Poot, MD;  Location: [place];  Service: Open Heart Surgery;  Laterality: N/A;     No Known Allergies    Family History  Problem Relation Age of Onset  .  Diabetes Brother      Social History Mr. Puryear reports that he quit smoking about 10 months ago. He smoked 2.00 packs per day. He has never used smokeless tobacco. Mr. Fortin reports that he does not drink alcohol.   Review of Systems CONSTITUTIONAL: No weight loss, fever, chills, weakness or fatigue.  HEENT: Eyes: No visual loss, blurred vision, double vision or yellow sclerae.No hearing loss, sneezing, congestion, runny nose or sore throat.  SKIN: No rash or itching.  CARDIOVASCULAR: per hpi RESPIRATORY: No shortness of breath, cough or sputum.  GASTROINTESTINAL: No anorexia, nausea, vomiting or diarrhea. No abdominal pain or blood.  GENITOURINARY: No burning on urination, no polyuria NEUROLOGICAL: No headache, dizziness, syncope, paralysis, ataxia, numbness or tingling in the extremities. No change in bowel or bladder control.  MUSCULOSKELETAL: No muscle, back pain, joint pain or stiffness.  LYMPHATICS: No enlarged nodes. No history of splenectomy.  PSYCHIATRIC: No history of depression or anxiety.  ENDOCRINOLOGIC: No reports of sweating, cold or heat intolerance. No polyuria or polydipsia.  Marland Kitchen   Physical Examination Vitals:   04/02/17 0823  BP: 116/76  Pulse: 77   Vitals:   04/02/17 0823  Weight: 209 lb (94.8 kg)  Height: _0  (1.753 m)    Gen: resting comfortably, no acute distress HEENT: no scleral icterus, pupils equal round and reactive, no palptable cervical adenopathy,  CV: RRR, no m/r/g, no jvd Resp: Clear to auscultation bilaterally GI: abdomen is soft, non-tender, non-distended, normal bowel sounds, no hepatosplenomegaly MSK: extremities are warm, no edema.  Skin: warm, no rash Neuro:  no focal deficits Psych: appropriate affect   Diagnostic Studies 05/2016 echo Study Conclusions  - Left ventricle: The cavity size was normal. There was mild concentric hypertrophy. Systolic function was normal. The estimated ejection fraction was in the range of  55% to 60%. Wall motion was normal; there were no regional wall motion abnormalities. The transmitral flow pattern was normal. Left ventricular diastolic function parameters were normal. - Aortic valve: Trileaflet; normal thickness, mildly calcified leaflets. - Mitral valve: There was trivial regurgitation.  05/2016 Cath  Prox RCA to Mid RCA lesion, 80% stenosed. Nondominant vessel.  Ost Cx to Prox Cx lesion, 80% stenosed.  Mid Cx-1 lesion, 90% stenosed.  Mid Cx-2 lesion, 75% stenosed.  Ost 2nd Mrg to 2nd Mrg lesion, 95% stenosed. Small vessel.  Ostial LAD to diastalLM lesion, 90% stenosed. Ulcerated lesion.  Mid LAD lesion, 80% stenosed.  The left ventricular systolic function is normal.  Ost LPDA lesion, 90% stenosed.   05/2016 Carotid US Summary: The right internal carotid artery exhibits 1-39% stenosis. The left internal carotid artery exhibits elevated velocities suggestive of upper range 40-59% stenosis. The left vertebral artery is  patent with antegrade flow. Unable to visualize the right vertebral artery.  Bilateral ABIs are within normal limits at rest. Prepared and Electronically Authenticated by     Assessment and Plan  1. CAD - no recent symptoms, continue current meds  2. HTN - bp is at goal, continue current meds  4. Hyperlipidemia - continue high dose statin in setting of known CAD - lipids have been at goal      Arnoldo Lenis, M.D.

## 2017-04-02 NOTE — Patient Instructions (Signed)

## 2017-05-19 DIAGNOSIS — E785 Hyperlipidemia, unspecified: Secondary | ICD-10-CM | POA: Diagnosis not present

## 2017-05-19 DIAGNOSIS — Z79899 Other long term (current) drug therapy: Secondary | ICD-10-CM | POA: Diagnosis not present

## 2017-05-20 LAB — LIPID PANEL
CHOL/HDL RATIO: 3.7 ratio (ref 0.0–5.0)
Cholesterol, Total: 117 mg/dL (ref 100–199)
HDL: 32 mg/dL — ABNORMAL LOW (ref >39)
LDL CALC: 46 mg/dL (ref 0–99)
Triglycerides: 197 mg/dL — ABNORMAL HIGH (ref 0–149)
VLDL Cholesterol Cal: 39 mg/dL (ref 5–40)

## 2017-05-20 LAB — HEPATIC FUNCTION PANEL
ALBUMIN: 4.2 g/dL (ref 3.5–5.5)
ALT: 27 IU/L (ref 0–44)
AST: 16 IU/L (ref 0–40)
Alkaline Phosphatase: 58 IU/L (ref 39–117)
BILIRUBIN TOTAL: 0.4 mg/dL (ref 0.0–1.2)
BILIRUBIN, DIRECT: 0.1 mg/dL (ref 0.00–0.40)
TOTAL PROTEIN: 6.2 g/dL (ref 6.0–8.5)

## 2017-06-03 ENCOUNTER — Encounter: Payer: BLUE CROSS/BLUE SHIELD | Admitting: Family Medicine

## 2017-06-11 ENCOUNTER — Ambulatory Visit (INDEPENDENT_AMBULATORY_CARE_PROVIDER_SITE_OTHER): Payer: BLUE CROSS/BLUE SHIELD | Admitting: Family Medicine

## 2017-06-11 ENCOUNTER — Encounter: Payer: Self-pay | Admitting: Family Medicine

## 2017-06-11 VITALS — BP 150/90 | Ht 69.0 in | Wt 214.0 lb

## 2017-06-11 DIAGNOSIS — E785 Hyperlipidemia, unspecified: Secondary | ICD-10-CM

## 2017-06-11 DIAGNOSIS — Z0000 Encounter for general adult medical examination without abnormal findings: Secondary | ICD-10-CM | POA: Diagnosis not present

## 2017-06-11 DIAGNOSIS — E1100 Type 2 diabetes mellitus with hyperosmolarity without nonketotic hyperglycemic-hyperosmolar coma (NKHHC): Secondary | ICD-10-CM

## 2017-06-11 DIAGNOSIS — E119 Type 2 diabetes mellitus without complications: Secondary | ICD-10-CM | POA: Diagnosis not present

## 2017-06-11 DIAGNOSIS — I10 Essential (primary) hypertension: Secondary | ICD-10-CM | POA: Diagnosis not present

## 2017-06-11 DIAGNOSIS — E118 Type 2 diabetes mellitus with unspecified complications: Secondary | ICD-10-CM | POA: Diagnosis not present

## 2017-06-11 LAB — POCT GLYCOSYLATED HEMOGLOBIN (HGB A1C): HEMOGLOBIN A1C: 6.7

## 2017-06-11 MED ORDER — INSULIN DETEMIR 100 UNIT/ML FLEXPEN: 12.0000 [IU] | PEN_INJECTOR | Freq: Every day | SUBCUTANEOUS | 5 refills | Status: DC

## 2017-06-11 MED ORDER — ATORVASTATIN CALCIUM 80 MG PO TABS: 80.0000 mg | ORAL_TABLET | Freq: Every day | ORAL | 1 refills | Status: DC

## 2017-06-11 MED ORDER — METFORMIN HCL 500 MG PO TABS: 500.0000 mg | ORAL_TABLET | Freq: Two times a day (BID) | ORAL | 1 refills | Status: DC

## 2017-06-11 MED ORDER — METOPROLOL TARTRATE 25 MG PO TABS: 12.5000 mg | ORAL_TABLET | Freq: Two times a day (BID) | ORAL | 1 refills | Status: DC

## 2017-06-11 NOTE — Progress Notes (Signed)
   Subjective:    Patient ID: [name], male    DOB: 08/25/1971, 46 y.o.   MRN: [id number]  HPI The patient comes in today for a wellness visit.    A review of their health history was completed.  A review of medications was also completed.  Any needed refills; No  Eating habits:Good  Falls/  MVA accidents in past few months: No  Regular exercise: Yes  Specialist pt sees on regular basis: No  Preventative health issues were discussed.   Additional concerns: No  Results for orders placed or performed in visit on 06/11/17  POCT glycosylated hemoglobin (Hb A1C)  Result Value Ref Range   Hemoglobin A1C 6.7    Patient claims compliance with diabetes medication. No obvious side effects. Reports no substantial low sugar spells. Most numbers are generally in good range when checked fasting. Generally does not miss a dose of medication. Watching diabetic diet closely  Blood pressure medicine and blood pressure levels reviewed today with patient. Compliant with blood pressure medicine. States does not miss a dose. No obvious side effects. Blood pressure generally good when checked elsewhere. Watching salt intake.  Glu 120 to 130  Patient continues to take lipid medication regularly. No obvious side effects from it. Generally does not miss a dose. Prior blood work results are reviewed with patient. Patient continues to work on fat intake in diet  No fam hx of co,on ca or prostate ca   Review of Systems  Constitutional: Negative for activity change, appetite change and fever.  HENT: Negative for congestion and rhinorrhea.   Eyes: Negative for discharge.  Respiratory: Negative for cough and wheezing.   Cardiovascular: Negative for chest pain.  Gastrointestinal: Negative for abdominal pain, blood in stool and vomiting.  Genitourinary: Negative for difficulty urinating and frequency.  Musculoskeletal: Negative for neck pain.  Skin: Negative for rash.  Allergic/Immunologic:  Negative for environmental allergies and food allergies.  Neurological: Negative for weakness and headaches.  Psychiatric/Behavioral: Negative for agitation.  All other systems reviewed and are negative.      Objective:   Physical Exam  Constitutional: He appears well-developed and well-nourished.  HENT:  Head: Normocephalic and atraumatic.  Right Ear: External ear normal.  Left Ear: External ear normal.  Nose: Nose normal.  Mouth/Throat: Oropharynx is clear and moist.  Eyes: Pupils are equal, round, and reactive to light. EOM are normal.  Neck: Normal range of motion. Neck supple. No thyromegaly present.  Cardiovascular: Normal rate, regular rhythm and normal heart sounds.   No murmur heard. Pulmonary/Chest: Effort normal and breath sounds normal. No respiratory distress. He has no wheezes.  Abdominal: Soft. Bowel sounds are normal. He exhibits no distension and no mass. There is no tenderness.  Genitourinary: Penis normal.  Musculoskeletal: Normal range of motion. He exhibits no edema.  Lymphadenopathy:    He has no cervical adenopathy.  Neurological: He is alert. He exhibits normal muscle tone.  Skin: Skin is warm and dry. No erythema.  Psychiatric: He has a normal mood and affect. His behavior is normal. Judgment normal.  Vitals reviewed.         Assessment & Plan:  Impression 1 wellness exam. Diet discussed exercise discussed. Fortunately patient continues to not smoke #2 type 2 diabetes good control discussed maintain same approach #3 hyperlipidemia prior blood work discussed good control discussed #4 coronary artery disease discussed. Blood work. Medications refilled. Diet discussed exercise discussed. Follow-up in 6 months. Anticipatory guidance

## 2017-07-15 ENCOUNTER — Encounter: Payer: Self-pay | Admitting: Orthopedic Surgery

## 2017-07-15 ENCOUNTER — Ambulatory Visit (INDEPENDENT_AMBULATORY_CARE_PROVIDER_SITE_OTHER): Payer: BLUE CROSS/BLUE SHIELD | Admitting: Orthopedic Surgery

## 2017-07-15 ENCOUNTER — Ambulatory Visit (INDEPENDENT_AMBULATORY_CARE_PROVIDER_SITE_OTHER): Payer: BLUE CROSS/BLUE SHIELD

## 2017-07-15 VITALS — BP 135/82 | HR 83 | Ht 70.0 in | Wt 209.0 lb

## 2017-07-15 DIAGNOSIS — M1712 Unilateral primary osteoarthritis, left knee: Secondary | ICD-10-CM | POA: Diagnosis not present

## 2017-07-15 DIAGNOSIS — G8929 Other chronic pain: Secondary | ICD-10-CM | POA: Diagnosis not present

## 2017-07-15 DIAGNOSIS — M25562 Pain in left knee: Secondary | ICD-10-CM

## 2017-07-15 NOTE — Patient Instructions (Addendum)

## 2017-07-15 NOTE — Progress Notes (Signed)
Patient ID: [name], male   DOB: December 09, 1970, 47 y.o.   MRN: [id number]   Chief Complaint  Patient presents with  . Knee Pain    left knee pain    [name] is a 46 y.o. male.   HPI 46 year old male presents for evaluation of long-standing left knee pain  The patient reports approximately 12 years ago he had a ganglion cyst removed from his left knee. At the time of surgery he was found to have arthritis and had some "scraping done" he also had a patellofemoral realignment of some kind.  He presents with medial and lateral left knee pain which runs proximally and distally occasionally from his hip to his foot and is associated with swelling of the left knee and painful left knee extension. His pain is unrelieved by Tylenol and Aleve  He had a four-vessel coronary artery bypass 1 year ago secondary to smoking and uncontrolled diabetes   Review of Systems Review of Systems  Respiratory: Negative for shortness of breath.   Cardiovascular: Negative for chest pain.  Musculoskeletal:       ? Back pain     Past Medical History:  Diagnosis Date  . Diabetes mellitus without complication (HCC)    diet controlled  . Hypertension    h/o of HTN, on no meds now.    Past Surgical History:  Procedure Laterality Date  . APPENDECTOMY    . CARDIAC CATHETERIZATION N/A 05/10/2016   Procedure: Left Heart Cath and Coronary Angiography;  Surgeon: Jettie Booze, MD;  Location: [place] [facility];  Service: Cardiovascular;  Laterality: N/A;  . CORONARY ARTERY BYPASS GRAFT N/A 05/13/2016   Procedure: CORONARY ARTERY BYPASS GRAFTING (CABG) X 4 UTILIZING THE LEFT INTERNAL MAMMARY ARTERY AND ENDOSCOPICALLY HARVESTED RIGHT  GREATER SAPHENEOUS VEIN.;  Surgeon: Ivin Poot, MD;  Location: [place];  Service: Open Heart Surgery;  Laterality: N/A;  LIMA TO LAD SVG to Diagonal SVG to OM SVG to PDA  . GANGLION CYST EXCISION Left 10/12/2015   Procedure: EXCISION MUCOID TUMOR LEFT MIDDLE FINGER ;   Surgeon: Daryll Brod, MD;  Location: [place];  Service: Orthopedics;  Laterality: Left;  Left middle finger.  Marland Kitchen KNEE ARTHROSCOPY Left   . TEE WITHOUT CARDIOVERSION N/A 05/13/2016   Procedure: TRANSESOPHAGEAL ECHOCARDIOGRAM (TEE);  Surgeon: Ivin Poot, MD;  Location: [place];  Service: Open Heart Surgery;  Laterality: N/A;    No Known Allergies  Current Outpatient Prescriptions  Medication Sig Dispense Refill  . acetaminophen (TYLENOL) 500 MG tablet Take 2 tablets (1,000 mg total) by mouth every 6 (six) hours as needed for mild pain or fever. 30 tablet 0  . aspirin EC 81 MG tablet Take 81 mg by mouth daily.    Marland Kitchen atorvastatin (LIPITOR) 80 MG tablet Take 1 tablet (80 mg total) by mouth daily at 6 PM. 90 tablet 1  . B-D UF III MINI PEN NEEDLES 31G X 5 MM MISC USE A CLEAN NEEDLE EVERY NIGHT AT BEDTIME TO INJECT INSULIN 100 each 5  . BAYER CONTOUR NEXT TEST test strip USE TO TEST BLOOD SUGAR FOUR TIMES DAILY 100 each 5  . blood glucose meter kit and supplies Dispense based on patient and insurance preference. Use up to four times daily as directed. (FOR ICD-9 250.00, 250.01). 1 each 0  . glucosamine-chondroitin 500-400 MG tablet Take 1 tablet by mouth daily.    . Insulin Detemir (LEVEMIR FLEXPEN) 100 UNIT/ML Pen Inject 12 Units  into the skin daily at 10 pm. 15 mL 5  . Insulin Pen Needle 31G X 6 MM MISC 1 Units by Does not apply route at bedtime. Please use clean needle every insulin administration 100 each 0  . lisinopril (PRINIVIL,ZESTRIL) 5 MG tablet Take 1 tablet (5 mg total) by mouth daily. 90 tablet 1  . metFORMIN (GLUCOPHAGE) 500 MG tablet Take 1 tablet (500 mg total) by mouth 2 (two) times daily with a meal. 180 tablet 1  . metoprolol tartrate (LOPRESSOR) 25 MG tablet Take 0.5 tablets (12.5 mg total) by mouth 2 (two) times daily. 90 tablet 1  . MICROLET LANCETS MISC USE TO TEST BLOOD SUGAR FOUR TIMES DAILY 100 each 5   No current facility-administered medications for this  visit.      Physical Exam BP 135/82   Pulse 83   Ht '5\' 10"'  (1.778 m)   Wt 209 lb (94.8 kg)   BMI 29.99 kg/m  Physical Exam   The patient is well developed well nourished and well groomed.   Orientation to person place and time is normal   Mood is pleasant. Affect normal  Ambulatory status is remarkable for NO  limp in the involved extremity         LEFT Knee examination:  Inspection: Tenderness is noted over the medial joint line   ROM: Is limited by pain with a maximum flexion arc of 120  Stability: Collateral ligaments are stable, the Lachman test and anterior and posterior drawer tests are normal   We do palpate medial joint line tenderness and lateral joint line tenderness with all negative McMurray's  Negative straight leg raise   Motor exam: Grade 5 motor strength in the quadriceps musculature   Skin: Warm dry and intact over the right leg                       Neuro: normal sensation   Vascular: 2+ DP pulse with normal color and no edema.   Currently the right lower extremity and knee examination revealed no tenderness or swelling, full range of motion without contracture subluxation atrophy or tremor. Normal muscle tone no instability and the neurovascular status of the limb is normal.    Assessment and Plan:  IMAGING: I have read and interpret the xrays as follows:  Osteoarthritis medial compartment left knee with tibial femoral alignment of 2 valgus mild patellofemoral arthritis  Diagnosis and treatment:   Osteoarthritis of the knee   Optimal treatment would be anti-inflammatory medication, however with his cardiac history and inset risk obviously we can't optimize that  Therefore we will inject the knee, advised strengthening exercises and provided him with an OA unloader brace using the DonJoy reaction knee brace  Procedure note left knee injection verbal consent was obtained to inject left knee joint  Timeout was completed to confirm the  site of injection  The medications used were 40 mg of Depo-Medrol and 1% lidocaine 3 cc  Anesthesia was provided by ethyl chloride and the skin was prepped with alcohol.  After cleaning the skin with alcohol a 20-gauge needle was used to inject the left knee joint. There were no complications. A sterile bandage was applied.  Encounter Diagnoses  Name Primary?  . Chronic pain of left knee Yes  . Primary osteoarthritis of left knee       Arther Abbott, MD 07/15/2017 9:23 AM

## 2017-07-24 DIAGNOSIS — M1712 Unilateral primary osteoarthritis, left knee: Secondary | ICD-10-CM | POA: Diagnosis not present

## 2017-07-31 ENCOUNTER — Other Ambulatory Visit: Payer: Self-pay | Admitting: Family Medicine

## 2017-08-07 ENCOUNTER — Other Ambulatory Visit: Payer: Self-pay | Admitting: Family Medicine

## 2017-10-10 ENCOUNTER — Other Ambulatory Visit: Payer: Self-pay | Admitting: Family Medicine

## 2017-10-13 ENCOUNTER — Encounter: Payer: Self-pay | Admitting: Family Medicine

## 2017-10-13 ENCOUNTER — Ambulatory Visit: Payer: BLUE CROSS/BLUE SHIELD | Admitting: Family Medicine

## 2017-10-13 VITALS — Temp 98.6°F | Ht 70.0 in | Wt 211.0 lb

## 2017-10-13 DIAGNOSIS — L02419 Cutaneous abscess of limb, unspecified: Secondary | ICD-10-CM

## 2017-10-13 MED ORDER — HYDROCODONE-ACETAMINOPHEN 5-325 MG PO TABS: 1.0000 | ORAL_TABLET | Freq: Four times a day (QID) | ORAL | 0 refills | Status: DC | PRN

## 2017-10-13 MED ORDER — DOXYCYCLINE HYCLATE 100 MG PO TABS: 100.0000 mg | ORAL_TABLET | Freq: Two times a day (BID) | ORAL | 0 refills | Status: DC

## 2017-10-13 NOTE — Progress Notes (Signed)
   Subjective:    Patient ID: [name], male    DOB: 09/23/1971, 46 y.o.   MRN: [id number]  HPI  Patient arrives with c/o boil under left arm for about a week.Substantial pain swelling.  Left axillary Review of Systems No headache, no major weight loss or weight gain, no chest pain no back pain abdominal pain no change in bowel habits complete ROS otherwise negative     Objective:   Physical Exam   Alert vitals stable, NAD. Blood pressure good on repeat. HEENT normal. Lungs clear. Heart regular rate and rhythm. Left axillary region impressive swollen abscess.  Patient was prepped and draped and anesthetized.  Incised.  Large amount of pus withdrawn.  Culture obtained.  Dressing applied.  Care discussed.  Prescribed antibiotics and pain medicine.  Local measures discussed     Assessment & Plan:

## 2017-10-14 ENCOUNTER — Other Ambulatory Visit: Payer: Self-pay | Admitting: Family Medicine

## 2017-10-15 LAB — WOUND CULTURE

## 2017-12-12 ENCOUNTER — Ambulatory Visit: Payer: BLUE CROSS/BLUE SHIELD | Admitting: Family Medicine

## 2017-12-19 ENCOUNTER — Ambulatory Visit: Payer: BLUE CROSS/BLUE SHIELD | Admitting: Family Medicine

## 2017-12-19 ENCOUNTER — Encounter: Payer: Self-pay | Admitting: Family Medicine

## 2017-12-19 VITALS — BP 130/82 | Ht 70.0 in | Wt 214.0 lb

## 2017-12-19 DIAGNOSIS — Z79899 Other long term (current) drug therapy: Secondary | ICD-10-CM | POA: Diagnosis not present

## 2017-12-19 DIAGNOSIS — E119 Type 2 diabetes mellitus without complications: Secondary | ICD-10-CM

## 2017-12-19 DIAGNOSIS — I10 Essential (primary) hypertension: Secondary | ICD-10-CM | POA: Diagnosis not present

## 2017-12-19 DIAGNOSIS — E785 Hyperlipidemia, unspecified: Secondary | ICD-10-CM

## 2017-12-19 LAB — POCT GLYCOSYLATED HEMOGLOBIN (HGB A1C): Hemoglobin A1C: 6.9

## 2017-12-19 MED ORDER — METOPROLOL TARTRATE 25 MG PO TABS: 12.5000 mg | ORAL_TABLET | Freq: Two times a day (BID) | ORAL | 1 refills | Status: DC

## 2017-12-19 MED ORDER — ATORVASTATIN CALCIUM 80 MG PO TABS: 80.0000 mg | ORAL_TABLET | Freq: Every day | ORAL | 1 refills | Status: DC

## 2017-12-19 MED ORDER — LISINOPRIL 5 MG PO TABS: 5.0000 mg | ORAL_TABLET | Freq: Every day | ORAL | 1 refills | Status: DC

## 2017-12-19 MED ORDER — INSULIN DETEMIR 100 UNIT/ML FLEXPEN: PEN_INJECTOR | SUBCUTANEOUS | 0 refills | Status: DC

## 2017-12-19 NOTE — Progress Notes (Signed)
   Subjective:    Patient ID: [name], male    DOB: 03/12/1971, 47 y.o.   MRN: [id number]  HPI Patient is here today to follow up on Dm. He is taking Metformin 500 mg one po BID.Levemir 12 units once daily. He eats healthy and not able to exercise as wants to.   Review of Systems Results for orders placed or performed in visit on 12/19/17  POCT glycosylated hemoglobin (Hb A1C)  Result Value Ref Range   Hemoglobin A1C 6.9    Patient claims compliance with diabetes medication. No obvious side effects. Reports no substantial low sugar spells. Most numbers are generally in good range when checked fasting. Generally does not miss a dose of medication. Watching diabetic diet closely  Blood pressure medicine and blood pressure levels reviewed today with patient. Compliant with blood pressure medicine. States does not miss a dose. No obvious side effects. Blood pressure generally good when checked elsewhere. Watching salt intake.  153 this morn  Patient continues to take lipid medication regularly. No obvious side effects from it. Generally does not miss a dose. Prior blood work results are reviewed with patient. Patient continues to work on fat intake in diet   No headache, no major weight loss or weight gain, no chest pain no back pain abdominal pain no change in bowel habits complete ROS otherwise negative  Diet overall good  Exercise not so good         Objective:   Physical Exam  Alert and oriented, vitals reviewed and stable, NAD ENT-TM's and ext canals WNL bilat via otoscopic exam Soft palate, tonsils and post pharynx WNL via oropharyngeal exam Neck-symmetric, no masses; thyroid nonpalpable and nontender Pulmonary-no tachypnea or accessory muscle use; Clear without wheezes via auscultation Card--no abnrml murmurs, rhythm reg and rate WNL Carotid pulses symmetric, without bruits           Assessment & Plan:  Impression type 2 diabetes.  Control good.  Discussed  to maintain same meds  2.  Hypertension good control discussed side effects benefits of meds discussed compliance discussed  3.  Hyperlipidemia prior blood work reviewed.  Discussed.  Patient to maintain  Appropriate blood work.  Medications refilled.  Diet exercise discussed.  Recheck in 6 months.  Wellness exam plus chronic   Greater than 50% of this 25 minute face to face visit was spent in counseling and discussion and coordination of care regarding the above diagnosis/diagnosies

## 2017-12-20 DIAGNOSIS — E785 Hyperlipidemia, unspecified: Secondary | ICD-10-CM | POA: Diagnosis not present

## 2017-12-20 DIAGNOSIS — Z79899 Other long term (current) drug therapy: Secondary | ICD-10-CM | POA: Diagnosis not present

## 2017-12-20 DIAGNOSIS — E119 Type 2 diabetes mellitus without complications: Secondary | ICD-10-CM | POA: Diagnosis not present

## 2017-12-21 ENCOUNTER — Encounter: Payer: Self-pay | Admitting: Family Medicine

## 2017-12-21 LAB — LIPID PANEL
CHOL/HDL RATIO: 3.2 ratio (ref 0.0–5.0)
CHOLESTEROL TOTAL: 113 mg/dL (ref 100–199)
HDL: 35 mg/dL — AB (ref >39)
LDL Calculated: 58 mg/dL (ref 0–99)
TRIGLYCERIDES: 99 mg/dL (ref 0–149)
VLDL CHOLESTEROL CAL: 20 mg/dL (ref 5–40)

## 2017-12-21 LAB — BASIC METABOLIC PANEL
BUN/Creatinine Ratio: 16 (ref 9–20)
BUN: 14 mg/dL (ref 6–24)
CALCIUM: 9.9 mg/dL (ref 8.7–10.2)
CHLORIDE: 103 mmol/L (ref 96–106)
CO2: 24 mmol/L (ref 20–29)
Creatinine, Ser: 0.87 mg/dL (ref 0.76–1.27)
GFR calc non Af Amer: 103 mL/min/{1.73_m2} (ref >59)
GFR, EST AFRICAN AMERICAN: 119 mL/min/{1.73_m2} (ref >59)
GLUCOSE: 161 mg/dL — AB (ref 65–99)
POTASSIUM: 5.2 mmol/L (ref 3.5–5.2)
Sodium: 141 mmol/L (ref 134–144)

## 2017-12-21 LAB — HEPATIC FUNCTION PANEL
ALT: 33 IU/L (ref 0–44)
AST: 17 IU/L (ref 0–40)
Albumin: 4.6 g/dL (ref 3.5–5.5)
Alkaline Phosphatase: 56 IU/L (ref 39–117)
BILIRUBIN TOTAL: 0.3 mg/dL (ref 0.0–1.2)
Bilirubin, Direct: 0.12 mg/dL (ref 0.00–0.40)
Total Protein: 6.7 g/dL (ref 6.0–8.5)

## 2018-02-03 ENCOUNTER — Other Ambulatory Visit: Payer: Self-pay | Admitting: Family Medicine

## 2018-02-25 ENCOUNTER — Other Ambulatory Visit: Payer: Self-pay | Admitting: Family Medicine

## 2018-02-27 ENCOUNTER — Other Ambulatory Visit: Payer: Self-pay | Admitting: Family Medicine

## 2018-03-05 ENCOUNTER — Other Ambulatory Visit: Payer: Self-pay | Admitting: Family Medicine

## 2018-04-24 ENCOUNTER — Other Ambulatory Visit: Payer: Self-pay | Admitting: Family Medicine

## 2018-05-14 ENCOUNTER — Telehealth: Payer: Self-pay | Admitting: Cardiology

## 2018-05-14 NOTE — Telephone Encounter (Signed)
Spoke with pt, Follow up scheduled at his convenience. Dr Eugenie Fillerhardesty made aware.

## 2018-05-14 NOTE — Telephone Encounter (Signed)
Dr. Christella NoaGrant Hardesty[place] wanting to see if this pt is ok for extractions from a cardiac stand point. Please give him a call @ [phone]

## 2018-05-26 ENCOUNTER — Other Ambulatory Visit: Payer: Self-pay | Admitting: Family Medicine

## 2018-06-09 ENCOUNTER — Encounter: Payer: Self-pay | Admitting: Student

## 2018-06-09 NOTE — Progress Notes (Signed)
Cardiology Office Note    Date:  06/10/2018   ID:  [name], DOB Apr 02, 1971, MRN [id number]  PCP:  Mikey Kirschner, MD  Cardiologist: Carlyle Dolly, MD    Chief Complaint  Patient presents with  . Follow-up    Annual Visit    History of Present Illness:    [name] is a 47 y.o. male with past medical history of CAD (s/p CABG in 05/2016 with LIMA-LAD, SVG-Diag, SVG-OM, and SVG-PDA), HTN, and HLD who presents to the office today for annual follow-up.  He was last examined by Dr. Harl Bowie in 04/2017 and denied any recent chest pain or dyspnea on exertion at that time. BP was well-controlled and LDL was at goal, therefore he was continued on his current medication regimen.   In talking with the patient today, he reports overall doing well from a cardiac perspective since his last office visit.  He is active at work and walks 4 to 5 miles per day and denies any anginal symptoms with this. No recent chest pain, dyspnea on exertion, orthopnea, PND, lower extremity edema, or palpitations.  He does not check his blood pressure regularly in the ambulatory setting but it is well controlled at 132/82 during today's visit.  The patient was initially planning to have a dental extraction at the time that today's visit was scheduled but he reports hoping to postpone this for several months as his dental infection has resolved and he wishes to delay the extraction for as long as possible.    Past Medical History:  Diagnosis Date  . CAD (coronary artery disease)    a. 05/2016 - NSTEMI with cath showing multivessel CAD --> s/p CABG with LIMA-LAD, SVG-Diag,SVG-OM, SVG-PDA  . Diabetes mellitus without complication ([place])   . Hypertension     Past Surgical History:  Procedure Laterality Date  . APPENDECTOMY    . CARDIAC CATHETERIZATION N/A 05/10/2016   Procedure: Left Heart Cath and Coronary Angiography;  Surgeon: Jettie Booze, MD;  Location: [facility];  Service:  Cardiovascular;  Laterality: N/A;  . CORONARY ARTERY BYPASS GRAFT N/A 05/13/2016   Procedure: CORONARY ARTERY BYPASS GRAFTING (CABG) X 4 UTILIZING THE LEFT INTERNAL MAMMARY ARTERY AND ENDOSCOPICALLY HARVESTED RIGHT  GREATER SAPHENEOUS VEIN.;  Surgeon: Ivin Poot, MD;  Location: [facility];  Service: Open Heart Surgery;  Laterality: N/A;  LIMA TO LAD SVG to Diagonal SVG to OM SVG to PDA  . GANGLION CYST EXCISION Left 10/12/2015   Procedure: EXCISION MUCOID TUMOR LEFT MIDDLE FINGER ;  Surgeon: Daryll Brod, MD;  Location: [facility];  Service: Orthopedics;  Laterality: Left;  Left middle finger.  Marland Kitchen KNEE ARTHROSCOPY Left   . TEE WITHOUT CARDIOVERSION N/A 05/13/2016   Procedure: TRANSESOPHAGEAL ECHOCARDIOGRAM (TEE);  Surgeon: Ivin Poot, MD;  Location: [facility];  Service: Open Heart Surgery;  Laterality: N/A;    Current Medications: Outpatient Medications Prior to Visit  Medication Sig Dispense Refill  . acetaminophen (TYLENOL) 500 MG tablet Take 2 tablets (1,000 mg total) by mouth every 6 (six) hours as needed for mild pain or fever. 30 tablet 0  . aspirin EC 81 MG tablet Take 81 mg by mouth daily.    Marland Kitchen atorvastatin (LIPITOR) 80 MG tablet Take 1 tablet (80 mg total) by mouth daily at 6 PM. 90 tablet 1  . B-D UF III MINI PEN NEEDLES 31G X 5 MM MISC USE A CLEAN NEEDLE EVERY NIGHT AT BEDTIME TO INJECT  INSULIN 100 each 5  . blood glucose meter kit and supplies Dispense based on patient and insurance preference. Use up to four times daily as directed. (FOR ICD-9 250.00, 250.01). 1 each 0  . CONTOUR NEXT TEST test strip USE TO TEST BLOOD SUGAR FOUR TIMES DAILY 100 each 5  . glucosamine-chondroitin 500-400 MG tablet Take 1 tablet by mouth daily.    . Insulin Pen Needle 31G X 6 MM MISC 1 Units by Does not apply route at bedtime. Please use clean needle every insulin administration 100 each 0  . LEVEMIR FLEXTOUCH 100 UNIT/ML Pen INJECT 12 UNITS INTO THE SKIN DAILY AT 10 PM 15 mL 0  .  lisinopril (PRINIVIL,ZESTRIL) 5 MG tablet TAKE 1 TABLET BY MOUTH DAILY 90 tablet 1  . metFORMIN (GLUCOPHAGE) 500 MG tablet TAKE 1 TABLET(500 MG) BY MOUTH TWICE DAILY WITH A MEAL 180 tablet 0  . metoprolol tartrate (LOPRESSOR) 25 MG tablet TAKE 1/2 TABLET(12.5 MG) BY MOUTH TWICE DAILY 90 tablet 1  . MICROLET LANCETS MISC USE TO TEST BLOOD SUGAR FOUR TIMES DAILY 100 each 5  . lisinopril (PRINIVIL,ZESTRIL) 5 MG tablet Take 1 tablet (5 mg total) by mouth daily. 90 tablet 1  . metoprolol tartrate (LOPRESSOR) 25 MG tablet Take 0.5 tablets (12.5 mg total) by mouth 2 (two) times daily. 90 tablet 1   No facility-administered medications prior to visit.      Allergies:   Patient has no known allergies.   Social History   Socioeconomic History  . Marital status: Married    Spouse name: Not on file  . Number of children: Not on file  . Years of education: Not on file  . Highest education level: Not on file  Occupational History  . Not on file  Social Needs  . Financial resource strain: Not on file  . Food insecurity:    Worry: Not on file    Inability: Not on file  . Transportation needs:    Medical: Not on file    Non-medical: Not on file  Tobacco Use  . Smoking status: Former Smoker    Packs/day: 2.00    Last attempt to quit: 05/10/2016    Years since quitting: 2.0  . Smokeless tobacco: Never Used  Substance and Sexual Activity  . Alcohol use: No    Alcohol/week: 0.0 oz  . Drug use: Not Currently    Types: Marijuana    Comment: occasionally marjuana  . Sexual activity: Not on file  Lifestyle  . Physical activity:    Days per week: Not on file    Minutes per session: Not on file  . Stress: Not on file  Relationships  . Social connections:    Talks on phone: Not on file    Gets together: Not on file    Attends religious service: Not on file    Active member of club or organization: Not on file    Attends meetings of clubs or organizations: Not on file    Relationship status:  Not on file  Other Topics Concern  . Not on file  Social History Narrative  . Not on file     Family History:  The patient's family history includes Diabetes in his brother.   Review of Systems:   Please see the history of present illness.     General:  No chills, fever, night sweats or weight changes.  Cardiovascular:  No chest pain, dyspnea on exertion, edema, orthopnea, palpitations, paroxysmal nocturnal dyspnea. Dermatological: No  rash, lesions/masses Respiratory: No cough, dyspnea Urologic: No hematuria, dysuria Abdominal:   No nausea, vomiting, diarrhea, bright red blood per rectum, melena, or hematemesis Neurologic:  No visual changes, wkns, changes in mental status.  He denies any of the above symptoms.   All other systems reviewed and are otherwise negative except as noted above.   Physical Exam:    VS:  BP 132/82   Pulse 92   Ht '5\' 10"'  (1.778 m)   Wt 211 lb 9.6 oz (96 kg)   SpO2 97%   BMI 30.36 kg/m    General: Well developed, well nourished Caucasian male appearing in no acute distress. Head: Normocephalic, atraumatic, sclera non-icteric, no xanthomas, nares are without discharge.  Neck: No carotid bruits. JVD not elevated.  Lungs: Respirations regular and unlabored, without wheezes or rales.  Heart: Regular rate and rhythm. No S3 or S4.  No murmur, no rubs, or gallops appreciated. Abdomen: Soft, non-tender, non-distended with normoactive bowel sounds. No hepatomegaly. No rebound/guarding. No obvious abdominal masses. Msk:  Strength and tone appear normal for age. No joint deformities or effusions. Extremities: No clubbing or cyanosis. No lower extremity edema.  Distal pedal pulses are 2+ bilaterally. Neuro: Alert and oriented X 3. Moves all extremities spontaneously. No focal deficits noted. Psych:  Responds to questions appropriately with a normal affect. Skin: No rashes or lesions noted  Wt Readings from Last 3 Encounters:  06/10/18 211 lb 9.6 oz (96 kg)    12/19/17 214 lb 0.4 oz (97.1 kg)  10/13/17 211 lb (95.7 kg)     Studies/Labs Reviewed:   EKG:  EKG is ordered today. The EKG ordered today demonstrates NSR, HR 84, with no acute ST or T-wave changes when compared to prior tracings.    Recent Labs: 12/20/2017: ALT 33; BUN 14; Creatinine, Ser 0.87; Potassium 5.2; Sodium 141   Lipid Panel    Component Value Date/Time   CHOL 113 12/20/2017 0923   TRIG 99 12/20/2017 0923   HDL 35 (L) 12/20/2017 0923   CHOLHDL 3.2 12/20/2017 0923   CHOLHDL 9.2 05/10/2016 0305   VLDL UNABLE TO CALCULATE IF TRIGLYCERIDE OVER 400 mg/dL 05/10/2016 0305   LDLCALC 58 12/20/2017 0923    Additional studies/ records that were reviewed today include:   Echocardiogram: 05/09/2016 Study Conclusions  - Left ventricle: The cavity size was normal. There was mild   concentric hypertrophy. Systolic function was normal. The   estimated ejection fraction was in the range of 55% to 60%. Wall   motion was normal; there were no regional wall motion   abnormalities. The transmitral flow pattern was normal. Left   ventricular diastolic function parameters were normal. - Aortic valve: Trileaflet; normal thickness, mildly calcified   leaflets. - Mitral valve: There was trivial regurgitation.  Cardiac Catheterization: 05/2016  Prox RCA to Mid RCA lesion, 80% stenosed. Nondominant vessel.  Ost Cx to Prox Cx lesion, 80% stenosed.  Mid Cx-1 lesion, 90% stenosed.  Mid Cx-2 lesion, 75% stenosed.  Ost 2nd Mrg to 2nd Mrg lesion, 95% stenosed. Small vessel.  Ostial LAD to diastalLM lesion, 90% stenosed. Ulcerated lesion.  Mid LAD lesion, 80% stenosed.  The left ventricular systolic function is normal.  Ost LPDA lesion, 90% stenosed.   Cardiac surgery consult for CABG.  He will need to stop smoking and take his DM medicines regularly.  WIll move to ICU given left main equivalent anatomy.   Assessment:    1. Coronary artery disease involving native coronary  artery of  native heart without angina pectoris   2. Essential hypertension   3. Mixed hyperlipidemia   4. Preoperative clearance      Plan:   In order of problems listed above:  1. CAD - s/p CABG in 05/2016 with LIMA-LAD, SVG-Diag, SVG-OM, and SVG-PDA. He denies any recent chest pain or dyspnea on exertion. Has been walking 4-5 miles daily without anginal symptoms. - EKG today shows NSR with no acute ischemic changes.  - continue ASA, BB, and statin therapy.   2. HTN - BP initially elevated at 142/88, improved to 132/82 on recheck. - continue Lisinopril 49m daily and Lopressor 12.533mBID.   3. HLD - FLP in 12/2017 showed total cholesterol 113, triglycerides 99, HDL 35, and LDL 58. At goal of LDL < 70. - continue Atorvastatin 8078maily (He has been having muscle aches and is unsure if this is due to the medication. We reviewed he can try taking 40m24mily for a few weeks to see if this helps. If no improvement, would resume at 80mg20mly).   4. Preoperative Clearance for Dental Extractions - he was initially scheduled for an extraction but says this has been postponed due to his infection resolving and is unsure when it will be rescheduled. Cleared to have extractions from a cardiac perspective as this is overall low-risk and he denies any recent anginal symptoms.  - if a single extraction, ASA should not need to be held. If undergoing multiple extractions, can hold ASA for 5 days prior to the procedure.    Medication Adjustments/Labs and Tests Ordered: Current medicines are reviewed at length with the patient today.  Concerns regarding medicines are outlined above.  Medication changes, Labs and Tests ordered today are listed in the Patient Instructions below. Patient Instructions  Medication Instructions:  Your physician recommends that you continue on your current medications as directed. Please refer to the Current Medication list given to you today.   Labwork: NONE    Testing/Procedures: NONE   Follow-Up: Your physician wants you to follow-up in: 1 Year. You will receive a reminder letter in the mail two months in advance. If you don't receive a letter, please call our office to schedule the follow-up appointment.   Any Other Special Instructions Will Be Listed Below (If Applicable).  If you need a refill on your cardiac medications before your next appointment, please call your pharmacy.  [facility] you for choosing [facility]igned, BrittErma HeritageC  06/10/2018 7:28 PM    [facility] [address]s[id number]: [phone]

## 2018-06-10 ENCOUNTER — Ambulatory Visit: Payer: BLUE CROSS/BLUE SHIELD | Admitting: Student

## 2018-06-10 ENCOUNTER — Encounter: Payer: Self-pay | Admitting: Student

## 2018-06-10 VITALS — BP 132/82 | HR 92 | Ht 70.0 in | Wt 211.6 lb

## 2018-06-10 DIAGNOSIS — Z01818 Encounter for other preprocedural examination: Secondary | ICD-10-CM

## 2018-06-10 DIAGNOSIS — I2510 Atherosclerotic heart disease of native coronary artery without angina pectoris: Secondary | ICD-10-CM

## 2018-06-10 DIAGNOSIS — I10 Essential (primary) hypertension: Secondary | ICD-10-CM | POA: Diagnosis not present

## 2018-06-10 DIAGNOSIS — E782 Mixed hyperlipidemia: Secondary | ICD-10-CM | POA: Diagnosis not present

## 2018-06-10 NOTE — Patient Instructions (Signed)
Medication Instructions:  Your physician recommends that you continue on your current medications as directed. Please refer to the Current Medication list given to you today.   Labwork: NONE   Testing/Procedures: NONE   Follow-Up: Your physician wants you to follow-up in: 1 Year. You will receive a reminder letter in the mail two months in advance. If you don't receive a letter, please call our office to schedule the follow-up appointment.   Any Other Special Instructions Will Be Listed Below (If Applicable).     If you need a refill on your cardiac medications before your next appointment, please call your pharmacy.  Thank you for choosing [facility]!   

## 2018-06-18 ENCOUNTER — Encounter: Payer: BLUE CROSS/BLUE SHIELD | Admitting: Family Medicine

## 2018-06-27 ENCOUNTER — Other Ambulatory Visit: Payer: Self-pay

## 2018-06-27 ENCOUNTER — Encounter (HOSPITAL_COMMUNITY): Payer: Self-pay | Admitting: Emergency Medicine

## 2018-06-27 ENCOUNTER — Emergency Department (HOSPITAL_COMMUNITY)
Admission: EM | Admit: 2018-06-27 | Discharge: 2018-06-27 | Disposition: A | Discharge status: Home / Self Care | Payer: BLUE CROSS/BLUE SHIELD | Attending: Emergency Medicine | Admitting: Emergency Medicine

## 2018-06-27 DIAGNOSIS — H53149 Visual discomfort, unspecified: Secondary | ICD-10-CM | POA: Diagnosis not present

## 2018-06-27 DIAGNOSIS — Y999 Unspecified external cause status: Secondary | ICD-10-CM | POA: Diagnosis not present

## 2018-06-27 DIAGNOSIS — Z7982 Long term (current) use of aspirin: Secondary | ICD-10-CM | POA: Diagnosis not present

## 2018-06-27 DIAGNOSIS — E119 Type 2 diabetes mellitus without complications: Secondary | ICD-10-CM | POA: Insufficient documentation

## 2018-06-27 DIAGNOSIS — Z7984 Long term (current) use of oral hypoglycemic drugs: Secondary | ICD-10-CM | POA: Diagnosis not present

## 2018-06-27 DIAGNOSIS — I2510 Atherosclerotic heart disease of native coronary artery without angina pectoris: Secondary | ICD-10-CM | POA: Diagnosis not present

## 2018-06-27 DIAGNOSIS — T1502XA Foreign body in cornea, left eye, initial encounter: Secondary | ICD-10-CM | POA: Insufficient documentation

## 2018-06-27 DIAGNOSIS — Z87891 Personal history of nicotine dependence: Secondary | ICD-10-CM | POA: Insufficient documentation

## 2018-06-27 DIAGNOSIS — Y9389 Activity, other specified: Secondary | ICD-10-CM | POA: Diagnosis not present

## 2018-06-27 DIAGNOSIS — Z951 Presence of aortocoronary bypass graft: Secondary | ICD-10-CM | POA: Insufficient documentation

## 2018-06-27 DIAGNOSIS — H5789 Other specified disorders of eye and adnexa: Secondary | ICD-10-CM | POA: Diagnosis not present

## 2018-06-27 DIAGNOSIS — H10012 Acute follicular conjunctivitis, left eye: Secondary | ICD-10-CM | POA: Diagnosis not present

## 2018-06-27 DIAGNOSIS — W458XXA Other foreign body or object entering through skin, initial encounter: Secondary | ICD-10-CM | POA: Insufficient documentation

## 2018-06-27 DIAGNOSIS — Y929 Unspecified place or not applicable: Secondary | ICD-10-CM | POA: Insufficient documentation

## 2018-06-27 DIAGNOSIS — S058X2A Other injuries of left eye and orbit, initial encounter: Secondary | ICD-10-CM | POA: Diagnosis not present

## 2018-06-27 DIAGNOSIS — I10 Essential (primary) hypertension: Secondary | ICD-10-CM | POA: Insufficient documentation

## 2018-06-27 DIAGNOSIS — I252 Old myocardial infarction: Secondary | ICD-10-CM | POA: Insufficient documentation

## 2018-06-27 DIAGNOSIS — S0502XD Injury of conjunctiva and corneal abrasion without foreign body, left eye, subsequent encounter: Secondary | ICD-10-CM | POA: Diagnosis not present

## 2018-06-27 DIAGNOSIS — H5712 Ocular pain, left eye: Secondary | ICD-10-CM | POA: Diagnosis not present

## 2018-06-27 DIAGNOSIS — H18892 Other specified disorders of cornea, left eye: Secondary | ICD-10-CM

## 2018-06-27 MED ORDER — TOBRAMYCIN 0.3 % OP SOLN
2.0000 [drp] | OPHTHALMIC | Status: DC
  Administered 2018-06-27: 2 [drp] via OPHTHALMIC
  Filled 2018-06-27: qty 5

## 2018-06-27 MED ORDER — HYDROCODONE-ACETAMINOPHEN 5-325 MG PO TABS: ORAL_TABLET | ORAL | 0 refills | Status: DC

## 2018-06-27 MED ORDER — TETRACAINE HCL 0.5 % OP SOLN
2.0000 [drp] | Freq: Once | OPHTHALMIC | Status: DC
  Filled 2018-06-27: qty 4

## 2018-06-27 MED ORDER — FLUORESCEIN SODIUM 1 MG OP STRP
1.0000 | ORAL_STRIP | Freq: Once | OPHTHALMIC | Status: DC
  Filled 2018-06-27: qty 1

## 2018-06-27 NOTE — ED Triage Notes (Signed)
FB sensation to L eye since Weds when he felt that he had a "piece of metal" in his eye  Seen at urgent care and they reportsed that they was going to try to get him an appointment but he has heard nothing from them and now comes for eval

## 2018-06-27 NOTE — ED Provider Notes (Signed)
[facility] EMERGENCY DEPARTMENT Provider Note   CSN: [id number] Arrival date & time: 06/27/18  1502     History   Chief Complaint Chief Complaint  Patient presents with  . Eye Problem    HPI [name] is a 47 y.o. male.  HPI  [name] is a 47 y.o. male who presents to the Emergency Department complaining of left eye pain and possible foreign body.  Symptoms present for 3 days.  Symptoms began after he was grinding metal.  Was wearing safety glasses at the time.  Symptoms associated with excessive tearing and redness of the eye.  Pt admits to rubbing his eye frequently.  Pt tried to irrigate his eye and he was also seen at a urgent care earlier and eye was flushed there as well.  He complains of blurred vision of the left eye.  He denies headache, dizziness, facial pain.  Does not wear corrective lenses.   Past Medical History:  Diagnosis Date  . CAD (coronary artery disease)    a. 05/2016 - NSTEMI with cath showing multivessel CAD --> s/p CABG with LIMA-LAD, SVG-Diag,SVG-OM, SVG-PDA  . Diabetes mellitus without complication ([place])   . Hypertension     Patient Active Problem List   Diagnosis Date Noted  . CAD (coronary artery disease)   . S/P CABG x 4 05/13/2016  . NSTEMI (non-ST elevated myocardial infarction) ([place])   . Tobacco abuse 05/09/2016  . Essential hypertension   . Diabetes mellitus with complication ([place])   . Chest pain 05/08/2016  . Diabetes mellitus type II, uncontrolled ([place]) 05/08/2016    Past Surgical History:  Procedure Laterality Date  . APPENDECTOMY    . CARDIAC CATHETERIZATION N/A 05/10/2016   Procedure: Left Heart Cath and Coronary Angiography;  Surgeon: Jettie Booze, MD;  Location: [facility];  Service: Cardiovascular;  Laterality: N/A;  . CORONARY ARTERY BYPASS GRAFT N/A 05/13/2016   Procedure: CORONARY ARTERY BYPASS GRAFTING (CABG) X 4 UTILIZING THE LEFT INTERNAL MAMMARY ARTERY AND ENDOSCOPICALLY HARVESTED RIGHT  GREATER  SAPHENEOUS VEIN.;  Surgeon: Ivin Poot, MD;  Location: [facility];  Service: Open Heart Surgery;  Laterality: N/A;  LIMA TO LAD SVG to Diagonal SVG to OM SVG to PDA  . GANGLION CYST EXCISION Left 10/12/2015   Procedure: EXCISION MUCOID TUMOR LEFT MIDDLE FINGER ;  Surgeon: Daryll Brod, MD;  Location: [facility];  Service: Orthopedics;  Laterality: Left;  Left middle finger.  Marland Kitchen KNEE ARTHROSCOPY Left   . TEE WITHOUT CARDIOVERSION N/A 05/13/2016   Procedure: TRANSESOPHAGEAL ECHOCARDIOGRAM (TEE);  Surgeon: Ivin Poot, MD;  Location: [facility];  Service: Open Heart Surgery;  Laterality: N/A;        Home Medications    Prior to Admission medications   Medication Sig Start Date End Date Taking? Authorizing Provider  acetaminophen (TYLENOL) 500 MG tablet Take 2 tablets (1,000 mg total) by mouth every 6 (six) hours as needed for mild pain or fever. 05/17/16   Barrett, Lodema Hong, PA-C  aspirin EC 81 MG tablet Take 81 mg by mouth daily.    [provider]  atorvastatin (LIPITOR) 80 MG tablet Take 1 tablet (80 mg total) by mouth daily at 6 PM. 12/19/17   Wolfgang Phoenix, Grace Bushy, MD  B-D UF III MINI PEN NEEDLES 31G X 5 MM MISC USE A CLEAN NEEDLE EVERY NIGHT AT BEDTIME TO INJECT INSULIN 03/05/18   Kathyrn Drown, MD  blood glucose meter kit and supplies  Dispense based on patient and insurance preference. Use up to four times daily as directed. (FOR ICD-9 250.00, 250.01). 05/17/16   Barrett, Erin R, PA-C  CONTOUR NEXT TEST test strip USE TO TEST BLOOD SUGAR FOUR TIMES DAILY 10/14/17   Kathyrn Drown, MD  glucosamine-chondroitin 500-400 MG tablet Take 1 tablet by mouth daily.    [provider]  Insulin Pen Needle 31G X 6 MM MISC 1 Units by Does not apply route at bedtime. Please use clean needle every insulin administration 05/17/16   Barrett, Lodema Hong, PA-C  LEVEMIR FLEXTOUCH 100 UNIT/ML Pen INJECT 12 UNITS INTO THE SKIN DAILY AT 10 PM 04/24/18   Mikey Kirschner, MD  lisinopril  (PRINIVIL,ZESTRIL) 5 MG tablet TAKE 1 TABLET BY MOUTH DAILY 02/03/18   Mikey Kirschner, MD  metFORMIN (GLUCOPHAGE) 500 MG tablet TAKE 1 TABLET(500 MG) BY MOUTH TWICE DAILY WITH A MEAL 05/26/18   Mikey Kirschner, MD  metoprolol tartrate (LOPRESSOR) 25 MG tablet TAKE 1/2 TABLET(12.5 MG) BY MOUTH TWICE DAILY 02/27/18   Mikey Kirschner, MD  MICROLET LANCETS MISC USE TO TEST BLOOD SUGAR FOUR TIMES DAILY 08/29/16   Kathyrn Drown, MD    Family History Family History  Problem Relation Age of Onset  . Diabetes Brother     Social History Social History   Tobacco Use  . Smoking status: Former Smoker    Packs/day: 2.00    Last attempt to quit: 05/10/2016    Years since quitting: 2.1  . Smokeless tobacco: Never Used  Substance Use Topics  . Alcohol use: No    Alcohol/week: 0.0 oz  . Drug use: Not Currently    Types: Marijuana    Comment: occasionally marjuana     Allergies   Patient has no known allergies.   Review of Systems Review of Systems  Constitutional: Negative for appetite change and fever.  HENT: Negative for congestion and facial swelling.   Eyes: Positive for photophobia, redness and visual disturbance. Negative for pain and itching.  Gastrointestinal: Negative for nausea and vomiting.  Genitourinary: Negative for dysuria and flank pain.  Skin: Negative for rash.  Neurological: Negative for dizziness, syncope, facial asymmetry, weakness and headaches.  Psychiatric/Behavioral: Negative for confusion.     Physical Exam Updated Vital Signs BP 124/79 (BP Location: Right Arm)   Pulse 79   Temp 98 F (36.7 C) (Oral)   Resp 16   Ht _0  (1.778 m)   Wt 95.7 kg (211 lb)   SpO2 97%   BMI 30.28 kg/m   Physical Exam  Constitutional: He appears well-developed and well-nourished. No distress.  HENT:  Head: Atraumatic.  Mouth/Throat: Oropharynx is clear and moist.  Eyes: Pupils are equal, round, and reactive to light. EOM are normal. Left eye exhibits no chemosis  and no discharge. Foreign body present in the left eye. Left conjunctiva is injected.    Embedded metallic appearing FB present at 12 o clock position.    Neck: Normal range of motion.  Cardiovascular: Normal rate and regular rhythm.  Pulmonary/Chest: Effort normal.  Musculoskeletal: Normal range of motion.  Neurological: He is alert. No sensory deficit.  Skin: Skin is warm. No rash noted.  Nursing note and vitals reviewed.    ED Treatments / Results  Labs (all labs ordered are listed, but only abnormal results are displayed) Labs Reviewed - No data to display  EKG None  Radiology No results found.  Procedures .Foreign Body Removal Date/Time: 06/27/2018 4:50 PM  Performed by: Kem Parkinson, PA-C Authorized by: Kem Parkinson, PA-C  Consent: Verbal consent obtained. Consent given by: patient Patient consent: the patient's understanding of the procedure matches consent given Patient identity confirmed: verbally with patient Time out: Immediately prior to procedure a "time out" was called to verify the correct patient, procedure, equipment, support staff and site/side marked as required. Body area: eye Location details: left cornea Anesthesia method: topical application.  Anesthesia: Local Anesthetic: tetracaine drops  Sedation: Patient sedated: no  Patient restrained: no Patient cooperative: yes Localization method: magnification Removal mechanism: ophthalmic burr and moist cotton swab Eye examined with fluorescein. Fluorescein uptake. Residual rust ring present. Dressing: antibiotic drops Depth: embedded Post-procedure assessment: residual foreign bodies remain Patient tolerance: Patient tolerated the procedure well with no immediate complications   (including critical care time)    Visual Acuity  Right Eye Distance: 20/20 Left Eye Distance: 20/25 Bilateral Distance: 20/20   Medications Ordered in ED Medications  fluorescein ophthalmic strip 1 strip  (has no administration in time range)  tetracaine (PONTOCAINE) 0.5 % ophthalmic solution 2 drop (has no administration in time range)     Initial Impression / Assessment and Plan / ED Course  I have reviewed the triage vital signs and the nursing notes.  Pertinent labs & imaging results that were available during my care of the patient were reviewed by me and considered in my medical decision making (see chart for details).    FB left cornea, small particles removed from left eye, but unable to remove completely.    [facility], Dr. Cher Nakai, will see in her office on Monday.  Recommended ophthalmic abx drops.  Discussed with Pt and he agrees to close f/u.    Final Clinical Impressions(s) / ED Diagnoses   Final diagnoses:  Corneal rust ring of left eye    ED Discharge Orders    None       Kem Parkinson, PA-C 06/28/18 1352    Pattricia Boss, MD 06/28/18 1517

## 2018-06-27 NOTE — Discharge Instructions (Addendum)
Avoid rubbing or touching your eye.  Apply one drop of the tobramycin to your left eye every 4 hrs.  You may also use an over the counter lubricating eye drop, one drop every 1-2 hrs if needed.  Dr. Tawny AsalBottorff will see you in her office on Monday morning.

## 2018-06-29 DIAGNOSIS — T1502XA Foreign body in cornea, left eye, initial encounter: Secondary | ICD-10-CM | POA: Diagnosis not present

## 2018-06-29 MED FILL — Hydrocodone-Acetaminophen Tab 5-325 MG: ORAL | Qty: 6 | Status: AC

## 2018-07-06 ENCOUNTER — Encounter: Payer: Self-pay | Admitting: Family Medicine

## 2018-07-06 ENCOUNTER — Ambulatory Visit (INDEPENDENT_AMBULATORY_CARE_PROVIDER_SITE_OTHER): Payer: BLUE CROSS/BLUE SHIELD | Admitting: Family Medicine

## 2018-07-06 VITALS — BP 156/98 | Ht 70.0 in | Wt 213.0 lb

## 2018-07-06 DIAGNOSIS — E118 Type 2 diabetes mellitus with unspecified complications: Secondary | ICD-10-CM

## 2018-07-06 DIAGNOSIS — Z79899 Other long term (current) drug therapy: Secondary | ICD-10-CM | POA: Diagnosis not present

## 2018-07-06 DIAGNOSIS — E119 Type 2 diabetes mellitus without complications: Secondary | ICD-10-CM | POA: Diagnosis not present

## 2018-07-06 DIAGNOSIS — Z125 Encounter for screening for malignant neoplasm of prostate: Secondary | ICD-10-CM

## 2018-07-06 DIAGNOSIS — Z0000 Encounter for general adult medical examination without abnormal findings: Secondary | ICD-10-CM | POA: Diagnosis not present

## 2018-07-06 DIAGNOSIS — E785 Hyperlipidemia, unspecified: Secondary | ICD-10-CM

## 2018-07-06 DIAGNOSIS — I10 Essential (primary) hypertension: Secondary | ICD-10-CM

## 2018-07-06 LAB — POCT GLYCOSYLATED HEMOGLOBIN (HGB A1C): Hemoglobin A1C: 8.2 % — AB (ref 4.0–5.6)

## 2018-07-06 MED ORDER — INSULIN DETEMIR 100 UNIT/ML FLEXPEN: 18.0000 [IU] | PEN_INJECTOR | Freq: Every day | SUBCUTANEOUS | 5 refills | Status: DC

## 2018-07-06 MED ORDER — METOPROLOL TARTRATE 25 MG PO TABS: ORAL_TABLET | ORAL | 1 refills | Status: DC

## 2018-07-06 MED ORDER — METFORMIN HCL 500 MG PO TABS: ORAL_TABLET | ORAL | 1 refills | Status: DC

## 2018-07-06 MED ORDER — ATORVASTATIN CALCIUM 80 MG PO TABS: 80.0000 mg | ORAL_TABLET | Freq: Every day | ORAL | 1 refills | Status: DC

## 2018-07-06 MED ORDER — DOXYCYCLINE HYCLATE 100 MG PO TABS: 100.0000 mg | ORAL_TABLET | Freq: Two times a day (BID) | ORAL | 0 refills | Status: AC

## 2018-07-06 MED ORDER — LISINOPRIL 5 MG PO TABS: 5.0000 mg | ORAL_TABLET | Freq: Every day | ORAL | 1 refills | Status: DC

## 2018-07-06 NOTE — Progress Notes (Signed)
Subjective:    Patient ID: [name], male    DOB: 09-23-1971, 47 y.o.   MRN: [id number]  HPI The patient comes in today for a wellness visit.    A review of their health history was completed.  A review of medications was also completed.  Any needed refills; No  Eating habits: Good  Falls/  MVA accidents in past few months: No  Regular exercise: Daily walks  Specialist pt sees on regular basis: No  Preventative health issues were discussed.   Additional concerns: Rash under arm thinks its shingles.  Patient claims compliance with diabetes medication. No obvious side effects. Reports no substantial low sugar spells. Most numbers are generally in good range when checked fasting. Generally does not miss a dose of medication. Watching diabetic diet closely Results for orders placed or performed in visit on 12/19/17  Lipid panel  Result Value Ref Range   Cholesterol, Total 113 100 - 199 mg/dL   Triglycerides 99 0 - 149 mg/dL   HDL 35 (L) >40 mg/dL   VLDL Cholesterol Cal 20 5 - 40 mg/dL   LDL Calculated 58 0 - 99 mg/dL   Chol/HDL Ratio 3.2 0.0 - 5.0 ratio  Hepatic function panel  Result Value Ref Range   Total Protein 6.7 6.0 - 8.5 g/dL   Albumin 4.6 3.5 - 5.5 g/dL   Bilirubin Total 0.3 0.0 - 1.2 mg/dL   Bilirubin, Direct 9.81 0.00 - 0.40 mg/dL   Alkaline Phosphatase 56 39 - 117 IU/L   AST 17 0 - 40 IU/L   ALT 33 0 - 44 IU/L  Basic metabolic panel  Result Value Ref Range   Glucose 161 (H) 65 - 99 mg/dL   BUN 14 6 - 24 mg/dL   Creatinine, Ser 1.91 0.76 - 1.27 mg/dL   GFR calc non Af Amer 103 >59 mL/min/1.73   GFR calc Af Amer 119 >59 mL/min/1.73   BUN/Creatinine Ratio 16 9 - 20   Sodium 141 134 - 144 mmol/L   Potassium 5.2 3.5 - 5.2 mmol/L   Chloride 103 96 - 106 mmol/L   CO2 24 20 - 29 mmol/L   Calcium 9.9 8.7 - 10.2 mg/dL  POCT glycosylated hemoglobin (Hb A1C)  Result Value Ref Range   Hemoglobin A1C 6.9    Patient claims compliance with diabetes  medication. No obvious side effects. Reports no substantial low sugar spells. Most numbers are generally in good range when checked fasting. Generally does not miss a dose of medication. Watching diabetic diet closely  Patient continues to take lipid medication regularly. No obvious side effects from it. Generally does not miss a dose. Prior blood work results are reviewed with patient. Patient continues to work on fat intake in diet  Blood pressure medicine and blood pressure levels reviewed today with patient. Compliant with blood pressure medicine. States does not miss a dose. No obvious side effects. Blood pressure generally good when checked elsewhere. Watching salt intake.    Bad rash cropped up around one month or so,   8.2 per crnt A1c  No colon ca no prost ca hx     Results for orders placed or performed in visit on 07/06/18  POCT glycosylated hemoglobin (Hb A1C)  Result Value Ref Range   Hemoglobin A1C 8.2 (A) 4.0 - 5.6 %   HbA1c POC (<> result, manual entry)  4.0 - 5.6 %   HbA1c, POC (prediabetic range)  5.7 - 6.4 %   HbA1c,  POC (controlled diabetic range)  0.0 - 7.0 %      Review of Systems  Constitutional: Negative for activity change, appetite change and fever.  HENT: Negative for congestion and rhinorrhea.   Eyes: Negative for discharge.  Respiratory: Negative for cough and wheezing.   Cardiovascular: Negative for chest pain.  Gastrointestinal: Negative for abdominal pain, blood in stool and vomiting.  Genitourinary: Negative for difficulty urinating and frequency.  Musculoskeletal: Negative for neck pain.  Skin: Negative for rash.  Allergic/Immunologic: Negative for environmental allergies and food allergies.  Neurological: Negative for weakness and headaches.  Psychiatric/Behavioral: Negative for agitation.  All other systems reviewed and are negative.      Objective:   Physical Exam  Constitutional: He appears well-developed and well-nourished.  HENT:    Head: Normocephalic and atraumatic.  Right Ear: External ear normal.  Left Ear: External ear normal.  Nose: Nose normal.  Mouth/Throat: Oropharynx is clear and moist.  Eyes: Pupils are equal, round, and reactive to light. EOM are normal.  Neck: Normal range of motion. Neck supple. No thyromegaly present.  Cardiovascular: Normal rate, regular rhythm and normal heart sounds.  No murmur heard. Pulmonary/Chest: Effort normal and breath sounds normal. No respiratory distress. He has no wheezes.  Abdominal: Soft. Bowel sounds are normal. He exhibits no distension and no mass. There is no tenderness.  Genitourinary: Penis normal.  Musculoskeletal: Normal range of motion. He exhibits no edema.  Lymphadenopathy:    He has no cervical adenopathy.  Neurological: He is alert. He exhibits normal muscle tone.  Skin: Skin is warm and dry. No erythema.  Psychiatric: He has a normal mood and affect. His behavior is normal. Judgment normal.  Vitals reviewed.         Assessment & Plan:  Impression wellness exam.  Diet discussed.  Exercise discussed.  Blood work ordered.  Up-to-date on vaccines except for flu shot encouraged.  2.  Type 2 diabetes suboptimal control discussed we will adjust long-acting insulin rationale discussed.  Need for fasting sugars to fall below 130 generally discussed  3.  Hypertension good control discussed maintain same meds  4.  Hyperlipidemia status uncertain will evaluate  Follow-up in 6 months diet exercise discussed medications refilled appropriate blood work

## 2018-07-11 DIAGNOSIS — E785 Hyperlipidemia, unspecified: Secondary | ICD-10-CM | POA: Diagnosis not present

## 2018-07-11 DIAGNOSIS — E119 Type 2 diabetes mellitus without complications: Secondary | ICD-10-CM | POA: Diagnosis not present

## 2018-07-11 DIAGNOSIS — Z79899 Other long term (current) drug therapy: Secondary | ICD-10-CM | POA: Diagnosis not present

## 2018-07-11 DIAGNOSIS — I10 Essential (primary) hypertension: Secondary | ICD-10-CM | POA: Diagnosis not present

## 2018-07-11 DIAGNOSIS — Z125 Encounter for screening for malignant neoplasm of prostate: Secondary | ICD-10-CM | POA: Diagnosis not present

## 2018-07-13 LAB — BASIC METABOLIC PANEL
BUN / CREAT RATIO: 14 (ref 9–20)
BUN: 12 mg/dL (ref 6–24)
CALCIUM: 9.2 mg/dL (ref 8.7–10.2)
CHLORIDE: 101 mmol/L (ref 96–106)
CO2: 23 mmol/L (ref 20–29)
Creatinine, Ser: 0.84 mg/dL (ref 0.76–1.27)
GFR, EST AFRICAN AMERICAN: 120 mL/min/{1.73_m2} (ref >59)
GFR, EST NON AFRICAN AMERICAN: 104 mL/min/{1.73_m2} (ref >59)
Glucose: 243 mg/dL — ABNORMAL HIGH (ref 65–99)
Potassium: 4.9 mmol/L (ref 3.5–5.2)
Sodium: 140 mmol/L (ref 134–144)

## 2018-07-13 LAB — HEPATIC FUNCTION PANEL
ALBUMIN: 4.4 g/dL (ref 3.5–5.5)
ALT: 35 IU/L (ref 0–44)
AST: 20 IU/L (ref 0–40)
Alkaline Phosphatase: 70 IU/L (ref 39–117)
BILIRUBIN TOTAL: 0.4 mg/dL (ref 0.0–1.2)
BILIRUBIN, DIRECT: 0.1 mg/dL (ref 0.00–0.40)
Total Protein: 6.2 g/dL (ref 6.0–8.5)

## 2018-07-13 LAB — MICROALBUMIN / CREATININE URINE RATIO
Creatinine, Urine: 210.3 mg/dL
MICROALBUM., U, RANDOM: 539.8 ug/mL
Microalb/Creat Ratio: 256.7 mg/g creat — ABNORMAL HIGH (ref 0.0–30.0)

## 2018-07-13 LAB — LIPID PANEL
Chol/HDL Ratio: 3.2 ratio (ref 0.0–5.0)
Cholesterol, Total: 109 mg/dL (ref 100–199)
HDL: 34 mg/dL — ABNORMAL LOW (ref >39)
LDL CALC: 53 mg/dL (ref 0–99)
Triglycerides: 109 mg/dL (ref 0–149)
VLDL Cholesterol Cal: 22 mg/dL (ref 5–40)

## 2018-07-13 LAB — PSA: Prostate Specific Ag, Serum: 0.6 ng/mL (ref 0.0–4.0)

## 2018-07-14 ENCOUNTER — Encounter: Payer: Self-pay | Admitting: Family Medicine

## 2018-07-15 ENCOUNTER — Other Ambulatory Visit (HOSPITAL_COMMUNITY): Payer: Self-pay | Admitting: Family Medicine

## 2018-07-15 DIAGNOSIS — Z79899 Other long term (current) drug therapy: Secondary | ICD-10-CM

## 2018-07-31 ENCOUNTER — Other Ambulatory Visit: Payer: Self-pay | Admitting: Family Medicine

## 2018-08-27 ENCOUNTER — Other Ambulatory Visit: Payer: Self-pay | Admitting: Family Medicine

## 2018-09-25 ENCOUNTER — Other Ambulatory Visit: Payer: Self-pay | Admitting: Family Medicine

## 2018-11-24 ENCOUNTER — Other Ambulatory Visit: Payer: Self-pay | Admitting: Family Medicine

## 2019-01-06 ENCOUNTER — Encounter: Payer: Self-pay | Admitting: Family Medicine

## 2019-01-06 ENCOUNTER — Ambulatory Visit (INDEPENDENT_AMBULATORY_CARE_PROVIDER_SITE_OTHER): Payer: BLUE CROSS/BLUE SHIELD | Admitting: Family Medicine

## 2019-01-06 ENCOUNTER — Telehealth: Payer: Self-pay | Admitting: Family Medicine

## 2019-01-06 VITALS — BP 132/82 | Ht 70.0 in | Wt 205.0 lb

## 2019-01-06 DIAGNOSIS — I10 Essential (primary) hypertension: Secondary | ICD-10-CM | POA: Diagnosis not present

## 2019-01-06 DIAGNOSIS — E785 Hyperlipidemia, unspecified: Secondary | ICD-10-CM | POA: Diagnosis not present

## 2019-01-06 DIAGNOSIS — Z0000 Encounter for general adult medical examination without abnormal findings: Secondary | ICD-10-CM

## 2019-01-06 DIAGNOSIS — Z79899 Other long term (current) drug therapy: Secondary | ICD-10-CM

## 2019-01-06 DIAGNOSIS — Z125 Encounter for screening for malignant neoplasm of prostate: Secondary | ICD-10-CM

## 2019-01-06 DIAGNOSIS — E119 Type 2 diabetes mellitus without complications: Secondary | ICD-10-CM

## 2019-01-06 LAB — POCT GLYCOSYLATED HEMOGLOBIN (HGB A1C): Hemoglobin A1C: 8 % — AB (ref 4.0–5.6)

## 2019-01-06 MED ORDER — METFORMIN HCL 500 MG PO TABS: 1000.0000 mg | ORAL_TABLET | Freq: Two times a day (BID) | ORAL | 1 refills | Status: DC

## 2019-01-06 MED ORDER — INSULIN DETEMIR 100 UNIT/ML FLEXPEN: 24.0000 [IU] | PEN_INJECTOR | Freq: Every day | SUBCUTANEOUS | 5 refills | Status: DC

## 2019-01-06 MED ORDER — METOPROLOL TARTRATE 25 MG PO TABS: ORAL_TABLET | ORAL | 1 refills | Status: DC

## 2019-01-06 MED ORDER — LISINOPRIL 5 MG PO TABS: 5.0000 mg | ORAL_TABLET | Freq: Every day | ORAL | 1 refills | Status: DC

## 2019-01-06 MED ORDER — ATORVASTATIN CALCIUM 80 MG PO TABS: 80.0000 mg | ORAL_TABLET | Freq: Every day | ORAL | 1 refills | Status: DC

## 2019-01-06 NOTE — Telephone Encounter (Signed)
Patient's last lab 07/2018-  Lipid, Liver, Met 7, PSA, Hgb A1c and Microalbumin Urine

## 2019-01-06 NOTE — Progress Notes (Signed)
   Subjective:    Patient ID: [name], male    DOB: 09/29/1971, 48 y.o.   MRN: [id number]  Diabetes  He presents for his follow-up diabetic visit. He has type 2 diabetes mellitus. Risk factors for coronary artery disease include diabetes mellitus, dyslipidemia and hypertension. Current diabetic treatment includes insulin injections and oral agent (monotherapy).   Patient claims compliance with diabetes medication. No obvious side effects. Reports no substantial low sugar spells. Most numbers are generally in good range when checked fasting. Generally does not miss a dose of medication. Watching diabetic diet closely  Results for orders placed or performed in visit on 01/06/19  POCT glycosylated hemoglobin (Hb A1C)  Result Value Ref Range   Hemoglobin A1C 8.0 (A) 4.0 - 5.6 %   HbA1c POC (<> result, manual entry)     HbA1c, POC (prediabetic range)     HbA1c, POC (controlled diabetic range)      Patient continues to take lipid medication regularly. No obvious side effects from it. Generally does not miss a dose. Prior blood work results are reviewed with patient. Patient continues to work on fat intake in diet  Blood pressure medicine and blood pressure levels reviewed today with patient. Compliant with blood pressure medicine. States does not miss a dose. No obvious side effects. Blood pressure generally good when checked elsewhere. Watching salt intake.  Drinking lots of sugar drinks    Review of Systems No headache, no major weight loss or weight gain, no chest pain no back pain abdominal pain no change in bowel habits complete ROS otherwise negative     Objective:   Physical Exam  Alert and oriented, vitals reviewed and stable, NAD ENT-TM's and ext canals WNL bilat via otoscopic exam Soft palate, tonsils and post pharynx WNL via oropharyngeal exam Neck-symmetric, no masses; thyroid nonpalpable and nontender Pulmonary-no tachypnea or accessory muscle use; Clear without  wheezes via auscultation Card--no abnrml murmurs, rhythm reg and rate WNL Carotid pulses symmetric, without bruits       Assessment & Plan:  Impression #1 type 2 diabetes.  Suboptimal control.  Discussed.  Will increase insulin dose.  Patient to tighten up diet  2.  Hypertension very good control.  Discussed to maintain same meds  3.  Hyperlipidemia.  Discussed.  Prior blood work reviewed.  Medications maintain/follow-up in 6 months for wellness.  Greater than 50% of this 25 minute face to face visit was spent in counseling and discussion and coordination of care regarding the above diagnosis/diagnosies

## 2019-01-06 NOTE — Telephone Encounter (Signed)
Pt has wellness 07/07/19, requesting blood work.

## 2019-01-07 NOTE — Telephone Encounter (Signed)
Orders put in. Tried to call. Voicemail not set up for pt to do bw near his wellness in august.

## 2019-01-07 NOTE — Telephone Encounter (Signed)
Dr Steve to see please 

## 2019-01-07 NOTE — Telephone Encounter (Signed)
Rep same, hopefully pt will do close to wllness

## 2019-01-08 NOTE — Telephone Encounter (Signed)
Telephone call-voicemail not set up ?

## 2019-01-12 NOTE — Telephone Encounter (Signed)
Contacted patient and informed patient that lab orders were placed and to go about a week or so before wellness visit. Pt verbalized understanding.

## 2019-06-01 ENCOUNTER — Other Ambulatory Visit: Payer: Self-pay | Admitting: Family Medicine

## 2019-06-11 ENCOUNTER — Ambulatory Visit (INDEPENDENT_AMBULATORY_CARE_PROVIDER_SITE_OTHER): Payer: BC Managed Care – PPO | Admitting: Cardiology

## 2019-06-11 ENCOUNTER — Other Ambulatory Visit: Payer: Self-pay

## 2019-06-11 ENCOUNTER — Encounter: Payer: Self-pay | Admitting: Cardiology

## 2019-06-11 VITALS — BP 139/79 | HR 92 | Temp 98.6°F | Ht 70.0 in | Wt 204.4 lb

## 2019-06-11 DIAGNOSIS — E782 Mixed hyperlipidemia: Secondary | ICD-10-CM | POA: Diagnosis not present

## 2019-06-11 DIAGNOSIS — I10 Essential (primary) hypertension: Secondary | ICD-10-CM

## 2019-06-11 DIAGNOSIS — I2510 Atherosclerotic heart disease of native coronary artery without angina pectoris: Secondary | ICD-10-CM | POA: Diagnosis not present

## 2019-06-11 NOTE — Patient Instructions (Signed)

## 2019-06-11 NOTE — Progress Notes (Signed)
Clinical Summary [name] is a 48 y.o.male seen today for follow up of the following medical problems.   1. CAD - admit 05/2016 with NSTEMI, cath with multivessel CAD, s/p CABG 05/2016 with LIMA-LAD, SVG-Diag,SVG-OM, SVG-PDA.  - 05/2016 echo LVEF 55-60%   - no recent chest pain. No SOB/DOE - compliant withmeds  2. HTN - compliant with meds   3. 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 Recently lost job in April due [place] 19. Hoping to restart in next few weeks  Past Medical History:  Diagnosis Date  . CAD (coronary artery disease)    a. 05/2016 - NSTEMI with cath showing multivessel CAD --> s/p CABG with LIMA-LAD, SVG-Diag,SVG-OM, SVG-PDA  . Diabetes mellitus without complication ([place])   . Hypertension      No Known Allergies   Current Outpatient Medications  Medication Sig Dispense Refill  . acetaminophen (TYLENOL) 500 MG tablet Take 2 tablets (1,000 mg total) by mouth every 6 (six) hours as needed for mild pain or fever. 30 tablet 0  . aspirin EC 81 MG tablet Take 81 mg by mouth daily.    Marland Kitchen atorvastatin (LIPITOR) 80 MG tablet Take 1 tablet (80 mg total) by mouth daily at 6 PM. 90 tablet 1  . B-D UF III MINI PEN NEEDLES 31G X 5 MM MISC USE A CLEAN NEEDLE EVERY NIGHT AT BEDTIME TO INJECT INSULIN 100 each 5  . blood glucose meter kit and supplies Dispense based on patient and insurance preference. Use up to four times daily as directed. (FOR ICD-9 250.00, 250.01). 1 each 0  . CONTOUR NEXT TEST test strip USE TO TEST BLOOD SUGAR FOUR TIMES DAILY 100 each 5  . glucosamine-chondroitin 500-400 MG tablet Take 1 tablet by mouth daily.    Marland Kitchen HYDROcodone-acetaminophen (NORCO/VICODIN) 5-325 MG tablet Take one tab po q 4 hrs prn pain 6 tablet 0  . Insulin Detemir (LEVEMIR FLEXTOUCH) 100 UNIT/ML Pen Inject 24 Units into the skin at bedtime. 15 mL 5  . Insulin Pen Needle 31G X 6 MM MISC  1 Units by Does not apply route at bedtime. Please use clean needle every insulin administration 100 each 0  . lisinopril (PRINIVIL,ZESTRIL) 5 MG tablet Take 1 tablet (5 mg total) by mouth daily. 90 tablet 1  . metFORMIN (GLUCOPHAGE) 500 MG tablet TAKE 1 TABLET(500 MG) BY MOUTH TWICE DAILY WITH A MEAL 180 tablet 0  . metoprolol tartrate (LOPRESSOR) 25 MG tablet TAKE 1/2 TABLET(12.5 MG) BY MOUTH TWICE DAILY 90 tablet 1  . MICROLET LANCETS MISC USE TO TEST BLOOD SUGAR FOUR TIMES DAILY 100 each 5   No current facility-administered medications for this visit.      Past Surgical History:  Procedure Laterality Date  . APPENDECTOMY    . CARDIAC CATHETERIZATION N/A 05/10/2016   Procedure: Left Heart Cath and Coronary Angiography;  Surgeon: Jettie Booze, MD;  Location: [facility];  Service: Cardiovascular;  Laterality: N/A;  . CORONARY ARTERY BYPASS GRAFT N/A 05/13/2016   Procedure: CORONARY ARTERY BYPASS GRAFTING (CABG) X 4 UTILIZING THE LEFT INTERNAL MAMMARY ARTERY AND ENDOSCOPICALLY HARVESTED RIGHT  GREATER SAPHENEOUS VEIN.;  Surgeon: Ivin Poot, MD;  Location: [facility];  Service: Open Heart Surgery;  Laterality: N/A;  LIMA TO LAD SVG to Diagonal SVG to OM SVG to PDA  . GANGLION CYST EXCISION Left  10/12/2015   Procedure: EXCISION MUCOID TUMOR LEFT MIDDLE FINGER ;  Surgeon: Daryll Brod, MD;  Location: [facility];  Service: Orthopedics;  Laterality: Left;  Left middle finger.  Marland Kitchen KNEE ARTHROSCOPY Left   . TEE WITHOUT CARDIOVERSION N/A 05/13/2016   Procedure: TRANSESOPHAGEAL ECHOCARDIOGRAM (TEE);  Surgeon: Ivin Poot, MD;  Location: [facility];  Service: Open Heart Surgery;  Laterality: N/A;     No Known Allergies    Family History  Problem Relation Age of Onset  . Diabetes Brother      Social History [name] reports that he quit smoking about 3 years ago. He smoked 2.00 packs per day. He has never used smokeless tobacco. [name] reports no history of  alcohol use.   Review of Systems CONSTITUTIONAL: No weight loss, fever, chills, weakness or fatigue.  HEENT: Eyes: No visual loss, blurred vision, double vision or yellow sclerae.No hearing loss, sneezing, congestion, runny nose or sore throat.  SKIN: No rash or itching.  CARDIOVASCULAR: per hpi RESPIRATORY: No shortness of breath, cough or sputum.  GASTROINTESTINAL: No anorexia, nausea, vomiting or diarrhea. No abdominal pain or blood.  GENITOURINARY: No burning on urination, no polyuria NEUROLOGICAL: No headache, dizziness, syncope, paralysis, ataxia, numbness or tingling in the extremities. No change in bowel or bladder control.  MUSCULOSKELETAL: No muscle, back pain, joint pain or stiffness.  LYMPHATICS: No enlarged nodes. No history of splenectomy.  PSYCHIATRIC: No history of depression or anxiety.  ENDOCRINOLOGIC: No reports of sweating, cold or heat intolerance. No polyuria or polydipsia.  Marland Kitchen   Physical Examination Today's Vitals   06/11/19 1353  BP: 139/79  Pulse: 92  Temp: 98.6 F (37 C)  SpO2: 96%  Weight: 204 lb 6.4 oz (92.7 kg)  Height: '5\' 10"'  (1.778 m)   Body mass index is 29.33 kg/m.  Gen: resting comfortably, no acute distress HEENT: no scleral icterus, pupils equal round and reactive, no palptable cervical adenopathy,  CV: RRR, no m/r/g, no jvd Resp: Clear to auscultation bilaterally GI: abdomen is soft, non-tender, non-distended, normal bowel sounds, no hepatosplenomegaly MSK: extremities are warm, no edema.  Skin: warm, no rash Neuro:  no focal deficits Psych: appropriate affect   Diagnostic Studies 05/2016 echo Study Conclusions  - Left ventricle: The cavity size was normal. There was mild concentric hypertrophy. Systolic function was normal. The estimated ejection fraction was in the range of 55% to 60%. Wall motion was normal; there were no regional wall motion abnormalities. The transmitral flow pattern was normal. Left ventricular  diastolic function parameters were normal. - Aortic valve: Trileaflet; normal thickness, mildly calcified leaflets. - Mitral valve: There was trivial regurgitation.  05/2016 Cath  Prox RCA to Mid RCA lesion, 80% stenosed. Nondominant vessel.  Ost Cx to Prox Cx lesion, 80% stenosed.  Mid Cx-1 lesion, 90% stenosed.  Mid Cx-2 lesion, 75% stenosed.  Ost 2nd Mrg to 2nd Mrg lesion, 95% stenosed. Small vessel.  Ostial LAD to diastalLM lesion, 90% stenosed. Ulcerated lesion.  Mid LAD lesion, 80% stenosed.  The left ventricular systolic function is normal.  Ost LPDA lesion, 90% stenosed.   05/2016 Carotid US Summary: The right internal carotid artery exhibits 1-39% stenosis. The left internal carotid artery exhibits elevated velocities suggestive of upper range 40-59% stenosis. The left vertebral artery is patent with antegrade flow. Unable to visualize the right vertebral artery.  Bilateral ABIs are within normal limits at rest. Prepared and Electronically Authenticated by    Assessment and Plan   1.  CAD - no recent symptoms - continue current meds  2. HTN - manual recheck 125/65, he is at goal. Continue current meds  3. Hyperlipidemia -LDL at goal, continue statin   F/u 1 year     Arnoldo Lenis, M.D., F.A.C.C.

## 2019-06-14 ENCOUNTER — Telehealth: Payer: Self-pay | Admitting: Family Medicine

## 2019-06-14 DIAGNOSIS — E785 Hyperlipidemia, unspecified: Secondary | ICD-10-CM

## 2019-06-14 DIAGNOSIS — Z79899 Other long term (current) drug therapy: Secondary | ICD-10-CM

## 2019-06-14 DIAGNOSIS — E119 Type 2 diabetes mellitus without complications: Secondary | ICD-10-CM

## 2019-06-14 NOTE — Telephone Encounter (Signed)
Lip liv A1c 

## 2019-06-14 NOTE — Telephone Encounter (Signed)
Blood work ordered in Epic. Patient notified. 

## 2019-06-14 NOTE — Telephone Encounter (Signed)
Patient  is requesting lab orders for physical 8/5. Please call [phone]

## 2019-06-14 NOTE — Telephone Encounter (Signed)
Last labs 01/2019:  Lipid, Liver, Met 7, PSA, HgbA1c, Micro albumin

## 2019-06-18 DIAGNOSIS — E119 Type 2 diabetes mellitus without complications: Secondary | ICD-10-CM | POA: Diagnosis not present

## 2019-06-18 DIAGNOSIS — Z79899 Other long term (current) drug therapy: Secondary | ICD-10-CM | POA: Diagnosis not present

## 2019-06-18 DIAGNOSIS — E785 Hyperlipidemia, unspecified: Secondary | ICD-10-CM | POA: Diagnosis not present

## 2019-06-19 LAB — HEPATIC FUNCTION PANEL
ALT: 26 IU/L (ref 0–44)
AST: 13 IU/L (ref 0–40)
Albumin: 4.5 g/dL (ref 4.0–5.0)
Alkaline Phosphatase: 75 IU/L (ref 39–117)
Bilirubin Total: 0.3 mg/dL (ref 0.0–1.2)
Bilirubin, Direct: 0.11 mg/dL (ref 0.00–0.40)
Total Protein: 6.5 g/dL (ref 6.0–8.5)

## 2019-06-19 LAB — HEMOGLOBIN A1C
Est. average glucose Bld gHb Est-mCnc: 226 mg/dL
Hgb A1c MFr Bld: 9.5 % — ABNORMAL HIGH (ref 4.8–5.6)

## 2019-06-19 LAB — LIPID PANEL
Chol/HDL Ratio: 3.9 ratio (ref 0.0–5.0)
Cholesterol, Total: 126 mg/dL (ref 100–199)
HDL: 32 mg/dL — ABNORMAL LOW (ref >39)
LDL Calculated: 51 mg/dL (ref 0–99)
Triglycerides: 214 mg/dL — ABNORMAL HIGH (ref 0–149)
VLDL Cholesterol Cal: 43 mg/dL — ABNORMAL HIGH (ref 5–40)

## 2019-07-02 ENCOUNTER — Other Ambulatory Visit: Payer: Self-pay | Admitting: Family Medicine

## 2019-07-07 ENCOUNTER — Encounter: Payer: Self-pay | Admitting: Family Medicine

## 2019-07-07 ENCOUNTER — Other Ambulatory Visit: Payer: Self-pay

## 2019-07-07 ENCOUNTER — Ambulatory Visit (INDEPENDENT_AMBULATORY_CARE_PROVIDER_SITE_OTHER): Payer: BC Managed Care – PPO | Admitting: Family Medicine

## 2019-07-07 VITALS — BP 142/88 | Temp 97.0°F | Ht 70.0 in | Wt 209.4 lb

## 2019-07-07 DIAGNOSIS — Z0000 Encounter for general adult medical examination without abnormal findings: Secondary | ICD-10-CM | POA: Diagnosis not present

## 2019-07-07 DIAGNOSIS — E119 Type 2 diabetes mellitus without complications: Secondary | ICD-10-CM | POA: Diagnosis not present

## 2019-07-07 DIAGNOSIS — E785 Hyperlipidemia, unspecified: Secondary | ICD-10-CM

## 2019-07-07 DIAGNOSIS — I10 Essential (primary) hypertension: Secondary | ICD-10-CM

## 2019-07-07 DIAGNOSIS — E1165 Type 2 diabetes mellitus with hyperglycemia: Secondary | ICD-10-CM

## 2019-07-07 MED ORDER — METOPROLOL TARTRATE 25 MG PO TABS: ORAL_TABLET | ORAL | 1 refills | Status: DC

## 2019-07-07 MED ORDER — LISINOPRIL 5 MG PO TABS: 5.0000 mg | ORAL_TABLET | Freq: Every day | ORAL | 1 refills | Status: DC

## 2019-07-07 MED ORDER — LEVEMIR FLEXTOUCH 100 UNIT/ML ~~LOC~~ SOPN: 24.0000 [IU] | PEN_INJECTOR | Freq: Every day | SUBCUTANEOUS | 5 refills | Status: DC

## 2019-07-07 MED ORDER — CONTOUR NEXT TEST VI STRP: ORAL_STRIP | 5 refills | Status: AC

## 2019-07-07 MED ORDER — MICROLET LANCETS MISC: 5 refills | Status: DC

## 2019-07-07 MED ORDER — METFORMIN HCL 500 MG PO TABS: ORAL_TABLET | ORAL | 1 refills | Status: DC

## 2019-07-07 NOTE — Progress Notes (Signed)
Subjective:    Patient ID: [name], male    DOB: 01/25/1971, 48 y.o.   MRN: [id number]  Diabetes He presents for his follow-up diabetic visit. He has type 2 diabetes mellitus. There are no hypoglycemic associated symptoms. Pertinent negatives for hypoglycemia include no headaches. There are no diabetic associated symptoms. Pertinent negatives for diabetes include no chest pain and no weakness. There are no hypoglycemic complications. There are no diabetic complications. He does not see a podiatrist.Eye exam is not current.   The patient comes in today for a wellness visit.    A review of their health history was completed.  A review of medications was also completed.  Any needed refills; all refills are on auto  Eating habits: eats good  Falls/  MVA accidents in past few months: none  Regular exercise: tries to   Specialist pt sees on regular basis: cardiologist  Preventative health issues were discussed.   Additional concerns: pt would like a script for hydroxychlorquine  for prophylactic reasons  Pt is wanting for preventive purposes   Pt thinks low dose might help    Pt wantig to avoid inflammation which is critical   Results for orders placed or performed in visit on 06/14/19  Lipid panel  Result Value Ref Range   Cholesterol, Total 126 100 - 199 mg/dL   Triglycerides 409214 (H) 0 - 149 mg/dL   HDL 32 (L) >81>39 mg/dL   VLDL Cholesterol Cal 43 (H) 5 - 40 mg/dL   LDL Calculated 51 0 - 99 mg/dL   Chol/HDL Ratio 3.9 0.0 - 5.0 ratio  Hepatic function panel  Result Value Ref Range   Total Protein 6.5 6.0 - 8.5 g/dL   Albumin 4.5 4.0 - 5.0 g/dL   Bilirubin Total 0.3 0.0 - 1.2 mg/dL   Bilirubin, Direct 1.910.11 0.00 - 0.40 mg/dL   Alkaline Phosphatase 75 39 - 117 IU/L   AST 13 0 - 40 IU/L   ALT 26 0 - 44 IU/L  Hemoglobin A1c  Result Value Ref Range   Hgb A1c MFr Bld 9.5 (H) 4.8 - 5.6 %   Est. average glucose Bld gHb Est-mCnc 226 mg/dL   exercse good ewith work      Fair amnt of soft drinks  Review of Systems  Constitutional: Negative for activity change, appetite change and fever.  HENT: Negative for congestion and rhinorrhea.   Eyes: Negative for discharge.  Respiratory: Negative for cough and wheezing.   Cardiovascular: Negative for chest pain.  Gastrointestinal: Negative for abdominal pain, blood in stool and vomiting.  Genitourinary: Negative for difficulty urinating and frequency.  Musculoskeletal: Negative for neck pain.  Skin: Negative for rash.  Allergic/Immunologic: Negative for environmental allergies and food allergies.  Neurological: Negative for weakness and headaches.  Psychiatric/Behavioral: Negative for agitation.  All other systems reviewed and are negative.      Objective:   Physical Exam Vitals signs reviewed.  Constitutional:      Appearance: He is well-developed.  HENT:     Head: Normocephalic and atraumatic.     Right Ear: External ear normal.     Left Ear: External ear normal.     Nose: Nose normal.  Eyes:     Pupils: Pupils are equal, round, and reactive to light.  Neck:     Musculoskeletal: Normal range of motion and neck supple.     Thyroid: No thyromegaly.  Cardiovascular:     Rate and Rhythm: Normal rate and regular rhythm.  Heart sounds: Normal heart sounds. No murmur.  Pulmonary:     Effort: Pulmonary effort is normal. No respiratory distress.     Breath sounds: Normal breath sounds. No wheezing.  Abdominal:     General: Bowel sounds are normal. There is no distension.     Palpations: Abdomen is soft. There is no mass.     Tenderness: There is no abdominal tenderness.  Genitourinary:    Penis: Normal.   Musculoskeletal: Normal range of motion.  Lymphadenopathy:     Cervical: No cervical adenopathy.  Skin:    General: Skin is warm and dry.     Findings: No erythema.  Neurological:     Mental Status: He is alert.     Motor: No abnormal muscle tone.  Psychiatric:        Behavior:  Behavior normal.        Judgment: Judgment normal.           Assessment & Plan:  Impression wellness exam.  Diet discussed.  Exercise discussed.  Vaccines discussed compliance with medicines discussed.  2.  Type 2 diabetes.  Control poor.  On further history patient has been taking his insulin only 3 or 4 out of 7 days.  Also notes his diet is really poor  3.  Coronary artery disease asymptomatic at this time followed by specialist  4.  Hyperlipidemia.  LDL in good control.  Triglycerides up some encouraged to tighten up diet  Patient to work hard on his diet.  Shift to 7 days/week insulin administration.  If glucoses remain elevated above 150 patient to call us medications refilled diet exercise discussed

## 2019-07-08 ENCOUNTER — Telehealth: Payer: Self-pay | Admitting: Family Medicine

## 2019-07-08 ENCOUNTER — Other Ambulatory Visit: Payer: Self-pay | Admitting: Family Medicine

## 2019-07-08 MED ORDER — MOMETASONE FUROATE 0.1 % EX CREA: TOPICAL_CREAM | CUTANEOUS | 1 refills | Status: AC

## 2019-07-08 NOTE — Telephone Encounter (Signed)
Patient was seen yesterday and suppose to have got cream or pill  Called in for his eczema on his foot.[facility]

## 2019-07-08 NOTE — Telephone Encounter (Signed)
Nurses first please apologize to the patient it is possible that Dr. Richardson Landry got busy and did not send in this prescription-unfortunately Dr. Richardson Landry is not here today Please find out from the patient what is going on with him which pharmacy he uses and I will get this sent in by suppertime  Other option is Dr. Richardson Landry will be back in the morning

## 2019-07-08 NOTE — Telephone Encounter (Signed)
This was sent in thank you

## 2019-07-08 NOTE — Telephone Encounter (Addendum)
Patient has eczema on his foot and uses [facility] on [address] and would like it sent in today and will check with pharmacy at supper time

## 2019-08-28 ENCOUNTER — Other Ambulatory Visit: Payer: Self-pay | Admitting: Family Medicine

## 2019-10-18 ENCOUNTER — Other Ambulatory Visit: Payer: Self-pay | Admitting: Family Medicine

## 2019-12-31 ENCOUNTER — Other Ambulatory Visit: Payer: Self-pay | Admitting: Family Medicine

## 2019-12-31 NOTE — Telephone Encounter (Signed)
Scheduled 3/9

## 2019-12-31 NOTE — Telephone Encounter (Signed)
Call pt, sched six mo f u next mo, may ref times one

## 2019-12-31 NOTE — Telephone Encounter (Signed)
Please schedule and then route back to nurses 

## 2020-01-05 ENCOUNTER — Encounter: Payer: Self-pay | Admitting: Family Medicine

## 2020-01-07 ENCOUNTER — Ambulatory Visit: Payer: BC Managed Care – PPO | Admitting: Family Medicine

## 2020-01-28 ENCOUNTER — Other Ambulatory Visit: Payer: Self-pay | Admitting: Family Medicine

## 2020-02-08 ENCOUNTER — Other Ambulatory Visit: Payer: Self-pay

## 2020-02-08 ENCOUNTER — Ambulatory Visit (INDEPENDENT_AMBULATORY_CARE_PROVIDER_SITE_OTHER): Payer: 59 | Admitting: Family Medicine

## 2020-02-08 DIAGNOSIS — I2510 Atherosclerotic heart disease of native coronary artery without angina pectoris: Secondary | ICD-10-CM

## 2020-02-08 DIAGNOSIS — Z79899 Other long term (current) drug therapy: Secondary | ICD-10-CM

## 2020-02-08 DIAGNOSIS — E119 Type 2 diabetes mellitus without complications: Secondary | ICD-10-CM | POA: Diagnosis not present

## 2020-02-08 DIAGNOSIS — I10 Essential (primary) hypertension: Secondary | ICD-10-CM | POA: Diagnosis not present

## 2020-02-08 DIAGNOSIS — E785 Hyperlipidemia, unspecified: Secondary | ICD-10-CM

## 2020-02-08 DIAGNOSIS — Z125 Encounter for screening for malignant neoplasm of prostate: Secondary | ICD-10-CM

## 2020-02-08 MED ORDER — METOPROLOL TARTRATE 25 MG PO TABS: ORAL_TABLET | ORAL | 1 refills | Status: DC

## 2020-02-08 MED ORDER — ATORVASTATIN CALCIUM 80 MG PO TABS: ORAL_TABLET | ORAL | 1 refills | Status: DC

## 2020-02-08 MED ORDER — LISINOPRIL 5 MG PO TABS: ORAL_TABLET | ORAL | 1 refills | Status: DC

## 2020-02-08 MED ORDER — METFORMIN HCL 500 MG PO TABS: ORAL_TABLET | ORAL | 1 refills | Status: DC

## 2020-02-08 NOTE — Progress Notes (Signed)
   Subjective:  Audio plus video  Patient ID: [name], male    DOB: [birth year], 49 y.o.   MRN: [id number]  Diabetes He presents for his follow-up diabetic visit. He has type 2 diabetes mellitus. He is compliant with treatment all of the time. He is following a generally healthy diet. Exercise: walks 4 -5 miles a day at work. Home blood sugar record trend: around 140 - 150. Eye exam is current (6 months ago).   Virtual Visit via Telephone Note  I connected with Octaviano Glow on 02/08/20 at  3:00 PM EST by telephone and verified that I am speaking with the correct person using two identifiers.  Location: Patient: home Provider: office   I discussed the limitations, risks, security and privacy concerns of performing an evaluation and management service by telephone and the availability of in person appointments. I also discussed with the patient that there may be a patient responsible charge related to this service. The patient expressed understanding and agreed to proceed.   History of Present Illness:    Observations/Objective:   Assessment and Plan:   Follow Up Instructions:    I discussed the assessment and treatment plan with the patient. The patient was provided an opportunity to ask questions and all were answered. The patient agreed with the plan and demonstrated an understanding of the instructions.   The patient was advised to call back or seek an in-person evaluation if the symptoms worsen or if the condition fails to improve as anticipated.  I provide 22 minutes of non-face-to-face time during this encounter.   Blood pressure medicine and blood pressure levels reviewed today with patient. Compliant with blood pressure medicine. States does not miss a dose. No obvious side effects. Blood pressure generally good when checked elsewhere. Watching salt intake.   Patient claims compliance with diabetes medication. No obvious side effects. Reports no substantial low  sugar spells. Most numbers are generally in good range when checked fasting. Generally does not miss a dose of medication. Watching diabetic diet closely  Patient continues to take lipid medication regularly. No obvious side effects from it. Generally does not miss a dose. Prior blood work results are reviewed with patient. Patient continues to work on fat intake in diet  Patient has known coronary artery disease followed by specialist.  Review of Systems No headache no current chest pain no shortness of breath    Objective:   Physical Exam   Virtual     Assessment & Plan:  Impression 1 type 2 diabetes.  Exact control uncertain.  Await A1c.  Diet discussed  2.  Hypertension.  Blood pressure is good when checked elsewhere to maintain same dose  3.  Hyperlipidemia status uncertain  4.  Coronary artery disease ongoing  Follow-up in 6 months.  Diet exercise discussed medications refilled await blood work

## 2020-04-02 ENCOUNTER — Other Ambulatory Visit: Payer: Self-pay | Admitting: Family Medicine

## 2020-08-09 ENCOUNTER — Telehealth: Payer: Self-pay | Admitting: Family Medicine

## 2020-08-09 MED ORDER — LISINOPRIL 5 MG PO TABS: ORAL_TABLET | ORAL | 0 refills | Status: DC

## 2020-08-09 NOTE — Telephone Encounter (Signed)
Pt's wife states pharmacy has been requesting refill on pts BP medication. Pt is completely out of medication, has est care appt on 10/4 with Dr. Ladona Ridgel.   Hoping to have refill called in today on lisinopril (ZESTRIL) 5 MG tablet    [id number] - [place], [place] - [address]

## 2020-08-09 NOTE — Telephone Encounter (Signed)
Refill sent to pharmacy. Pt does not have voicemail set up at this time; unable to leave message

## 2020-08-09 NOTE — Telephone Encounter (Signed)
May have 90 tablets

## 2020-08-09 NOTE — Telephone Encounter (Signed)
Please advise. Thank you

## 2020-08-23 ENCOUNTER — Other Ambulatory Visit: Payer: Self-pay | Admitting: *Deleted

## 2020-08-23 MED ORDER — METFORMIN HCL 500 MG PO TABS: ORAL_TABLET | ORAL | 0 refills | Status: DC

## 2020-08-23 MED ORDER — METOPROLOL TARTRATE 25 MG PO TABS: ORAL_TABLET | ORAL | 0 refills | Status: DC

## 2020-09-04 ENCOUNTER — Other Ambulatory Visit: Payer: Self-pay

## 2020-09-04 ENCOUNTER — Ambulatory Visit (INDEPENDENT_AMBULATORY_CARE_PROVIDER_SITE_OTHER): Payer: 59 | Admitting: Family Medicine

## 2020-09-04 ENCOUNTER — Encounter: Payer: Self-pay | Admitting: Family Medicine

## 2020-09-04 VITALS — BP 148/76 | HR 95 | Temp 98.0°F | Ht 70.0 in | Wt 204.0 lb

## 2020-09-04 DIAGNOSIS — I10 Essential (primary) hypertension: Secondary | ICD-10-CM

## 2020-09-04 DIAGNOSIS — Z720 Tobacco use: Secondary | ICD-10-CM | POA: Diagnosis not present

## 2020-09-04 DIAGNOSIS — I2510 Atherosclerotic heart disease of native coronary artery without angina pectoris: Secondary | ICD-10-CM

## 2020-09-04 DIAGNOSIS — E119 Type 2 diabetes mellitus without complications: Secondary | ICD-10-CM

## 2020-09-04 DIAGNOSIS — Z0100 Encounter for examination of eyes and vision without abnormal findings: Secondary | ICD-10-CM

## 2020-09-04 DIAGNOSIS — Z125 Encounter for screening for malignant neoplasm of prostate: Secondary | ICD-10-CM

## 2020-09-04 LAB — POCT GLYCOSYLATED HEMOGLOBIN (HGB A1C): Hemoglobin A1C: 7.8 % — AB (ref 4.0–5.6)

## 2020-09-04 MED ORDER — FREESTYLE LIBRE 14 DAY READER DEVI: 0 refills | Status: DC

## 2020-09-04 MED ORDER — FREESTYLE LIBRE 14 DAY SENSOR MISC: 3 refills | Status: DC

## 2020-09-04 NOTE — Progress Notes (Signed)
Patient ID: [name], male    DOB: 05/23/1971, 49 y.o.   MRN: [id number]   Chief Complaint  Patient presents with  . Establish Care  . Hypertension  . Coronary Artery Disease  . Hyperlipidemia   Subjective:    HPI  F/u on HTN, CAD, HLD.  pt arrives to establish care.   Has been over 1 yr since last visit. On levemir and metformin.  Pt stating for HLD- Dec in cholesterol meds due to leg pain now taking 1/2 atorvastatin. Seeing cards, and cutting in half, hasn't seen them in past year. Not eating as late and working on dec pork in his diet and high cholesterol foods. Drinking more sodas, drinking 2 16 oz drinks per day.  Dm2- Compliant with medications. Checking blood glucose.   Not seeing any high or low numbers.  Denies polyuria or polydipsia.  Eye exam: overdue to get eye exam. Foot exam: no concerns.  HTN Pt compliant with BP meds.  No SEs Denies chest pain, sob, LE swelling, or blurry vision.   Pt stating more stress and smoking again and having relationship/marriage issues.  Results for orders placed or performed in visit on 09/04/20  POCT glycosylated hemoglobin (Hb A1C)  Result Value Ref Range   Hemoglobin A1C 7.8 (A) 4.0 - 5.6 %   HbA1c POC (<> result, manual entry)     HbA1c, POC (prediabetic range)     HbA1c, POC (controlled diabetic range)       Medical History [place] has a past medical history of CAD (coronary artery disease), Diabetes mellitus without complication ([place]), and Hypertension.   Outpatient Encounter Medications as of 09/04/2020  Medication Sig  . acetaminophen (TYLENOL) 500 MG tablet Take 2 tablets (1,000 mg total) by mouth every 6 (six) hours as needed for mild pain or fever.  Marland Kitchen aspirin EC 81 MG tablet Take 81 mg by mouth daily.  Marland Kitchen atorvastatin (LIPITOR) 80 MG tablet TAKE 1 TABLET(80 MG) BY MOUTH DAILY AT 6 PM  . B-D UF III MINI PEN NEEDLES 31G X 5 MM MISC USE A CLEAN NEEDLE EVERY NIGHT AT BEDTIME TO INJECT INSULIN  . blood  glucose meter kit and supplies Dispense based on patient and insurance preference. Use up to four times daily as directed. (FOR ICD-9 250.00, 250.01).  Marland Kitchen glucosamine-chondroitin 500-400 MG tablet Take 1 tablet by mouth daily.  Marland Kitchen glucose blood (CONTOUR NEXT TEST) test strip USE TO TEST BLOOD SUGAR FOUR TIMES DAILY  . Insulin Detemir (LEVEMIR FLEXTOUCH) 100 UNIT/ML Pen Inject 24 Units into the skin at bedtime.  . Insulin Pen Needle 31G X 6 MM MISC 1 Units by Does not apply route at bedtime. Please use clean needle every insulin administration  . lisinopril (ZESTRIL) 5 MG tablet TAKE 1 TABLET(5 MG) BY MOUTH DAILY  . metFORMIN (GLUCOPHAGE) 500 MG tablet TAKE 1 TABLET(500 MG) BY MOUTH TWICE DAILY WITH A MEAL  . metoprolol tartrate (LOPRESSOR) 25 MG tablet Take one half tablet by mouth twice daily  . Microlet Lancets MISC USE TO TEST BLOOD SUGAR FOUR TIMES DAILY  . OVER THE COUNTER MEDICATION Vit d 3 and zinc   No facility-administered encounter medications on file as of 09/04/2020.     Review of Systems  Constitutional: Negative for chills and fever.  HENT: Negative for congestion, rhinorrhea and sore throat.   Respiratory: Negative for cough, shortness of breath and wheezing.   Cardiovascular: Negative for chest pain and leg swelling.  Gastrointestinal: Negative for  abdominal pain, diarrhea, nausea and vomiting.  Genitourinary: Negative for dysuria and frequency.  Skin: Negative for rash.  Neurological: Negative for dizziness, weakness and headaches.     Vitals BP (!) 148/76   Pulse 95   Temp 98 F (36.7 C)   Ht _0  (1.778 m)   Wt 204 lb (92.5 kg)   SpO2 96%   BMI 29.27 kg/m   Objective:   Physical Exam Vitals and nursing note reviewed.  Constitutional:      General: He is not in acute distress.    Appearance: Normal appearance. He is not ill-appearing.  HENT:     Head: Normocephalic.     Nose: Nose normal. No congestion.     Mouth/Throat:     Mouth: Mucous membranes  are moist.     Pharynx: No oropharyngeal exudate.  Eyes:     Extraocular Movements: Extraocular movements intact.     Conjunctiva/sclera: Conjunctivae normal.     Pupils: Pupils are equal, round, and reactive to light.  Cardiovascular:     Rate and Rhythm: Normal rate and regular rhythm.     Pulses: Normal pulses.     Heart sounds: Normal heart sounds. No murmur heard.   Pulmonary:     Effort: Pulmonary effort is normal.     Breath sounds: Normal breath sounds. No wheezing, rhonchi or rales.  Musculoskeletal:        General: Normal range of motion.     Right lower leg: No edema.     Left lower leg: No edema.  Skin:    General: Skin is warm and dry.     Findings: No rash.  Neurological:     General: No focal deficit present.     Mental Status: He is alert and oriented to person, place, and time.     Cranial Nerves: No cranial nerve deficit.  Psychiatric:        Mood and Affect: Mood normal.        Behavior: Behavior normal.        Thought Content: Thought content normal.        Judgment: Judgment normal.      Assessment and Plan   1. Diabetes mellitus without complication (HCC) - POCT glycosylated hemoglobin (Hb A1C) - CBC - CMP14+EGFR - Hemoglobin A1c - Lipid panel - Microalbumin, urine  2. Screening PSA (prostate specific antigen) - PSA; Future  3. Tobacco abuse  4. Coronary artery disease involving native coronary artery of native heart without angina pectoris  5. Essential hypertension  6. Diabetic eye exam [facility]) - Ambulatory referral to Ophthalmology    DM2- improved a1c.  Cont meds. a1c at 7.8, dec from 9.5 last year, will recheck labs. Pt wanting to see about getting CBG monitoring. Libre freestyle- ordered. Pt to get labs his week. Diabetic eye exam ordered.  Pt restarted smoking.  Not ready to quit.  Advising to call back when ready.  Reviewed using nicoderm patches, chantix, or wellbutrin.  CAD- cont meds. Advising to work toward quitting  smoking. Cont to keep bp, DM and hld in control to help with his cad.   htn- suboptimal.  Advising to dec salt in diet and dec smoking.  Will recheck on next visit.  Cont meds.  Feeling stressed with recent news of his step mother sick in hospital.  Will recheck on next visit.  F/u 3 mo or prn.

## 2020-09-06 LAB — CMP14+EGFR
ALT: 22 IU/L (ref 0–44)
AST: 13 IU/L (ref 0–40)
Albumin/Globulin Ratio: 2.3 — ABNORMAL HIGH (ref 1.2–2.2)
Albumin: 4.3 g/dL (ref 4.0–5.0)
Alkaline Phosphatase: 79 IU/L (ref 44–121)
BUN/Creatinine Ratio: 11 (ref 9–20)
BUN: 10 mg/dL (ref 6–24)
Bilirubin Total: 0.3 mg/dL (ref 0.0–1.2)
CO2: 24 mmol/L (ref 20–29)
Calcium: 9.6 mg/dL (ref 8.7–10.2)
Chloride: 103 mmol/L (ref 96–106)
Creatinine, Ser: 0.94 mg/dL (ref 0.76–1.27)
GFR calc Af Amer: 109 mL/min/{1.73_m2} (ref >59)
GFR calc non Af Amer: 94 mL/min/{1.73_m2} (ref >59)
Globulin, Total: 1.9 g/dL (ref 1.5–4.5)
Glucose: 232 mg/dL — ABNORMAL HIGH (ref 65–99)
Potassium: 4.1 mmol/L (ref 3.5–5.2)
Sodium: 141 mmol/L (ref 134–144)
Total Protein: 6.2 g/dL (ref 6.0–8.5)

## 2020-09-06 LAB — CBC
Hematocrit: 50.2 % (ref 37.5–51.0)
Hemoglobin: 16.6 g/dL (ref 13.0–17.7)
MCH: 31.4 pg (ref 26.6–33.0)
MCHC: 33.1 g/dL (ref 31.5–35.7)
MCV: 95 fL (ref 79–97)
Platelets: 201 10*3/uL (ref 150–450)
RBC: 5.29 x10E6/uL (ref 4.14–5.80)
RDW: 12.1 % (ref 11.6–15.4)
WBC: 7.7 10*3/uL (ref 3.4–10.8)

## 2020-09-06 LAB — MICROALBUMIN, URINE: Microalbumin, Urine: 717.2 ug/mL

## 2020-09-06 LAB — LIPID PANEL
Chol/HDL Ratio: 4.3 ratio (ref 0.0–5.0)
Cholesterol, Total: 130 mg/dL (ref 100–199)
HDL: 30 mg/dL — ABNORMAL LOW (ref >39)
LDL Chol Calc (NIH): 70 mg/dL (ref 0–99)
Triglycerides: 178 mg/dL — ABNORMAL HIGH (ref 0–149)
VLDL Cholesterol Cal: 30 mg/dL (ref 5–40)

## 2020-09-06 LAB — HEMOGLOBIN A1C
Est. average glucose Bld gHb Est-mCnc: 223 mg/dL
Hgb A1c MFr Bld: 9.4 % — ABNORMAL HIGH (ref 4.8–5.6)

## 2020-09-07 ENCOUNTER — Encounter: Payer: Self-pay | Admitting: Family Medicine

## 2020-09-14 ENCOUNTER — Telehealth: Payer: Self-pay

## 2020-09-14 ENCOUNTER — Other Ambulatory Visit: Payer: Self-pay | Admitting: *Deleted

## 2020-09-14 DIAGNOSIS — E1165 Type 2 diabetes mellitus with hyperglycemia: Secondary | ICD-10-CM

## 2020-09-14 MED ORDER — LEVEMIR FLEXTOUCH 100 UNIT/ML ~~LOC~~ SOPN: 27.0000 [IU] | PEN_INJECTOR | Freq: Every day | SUBCUTANEOUS | 2 refills | Status: DC

## 2020-09-14 NOTE — Telephone Encounter (Signed)
Patient receive a letter from our office stating needing to go over labs and change some of his medications. But when I look in system the letter is blank only patient name and your name at the bottom. The body of the letter is blank its from 10/7. He wants to know if that can be by phone or will he have to come in for a follow up in six months. Please advise

## 2020-09-15 NOTE — Telephone Encounter (Signed)
Pt called the office and labs and results reviewed.  New script for insulin sent to pharmacy.  Dr. Ladona Ridgel

## 2020-11-02 ENCOUNTER — Other Ambulatory Visit: Payer: Self-pay | Admitting: Family Medicine

## 2020-11-18 ENCOUNTER — Other Ambulatory Visit: Payer: Self-pay | Admitting: Family Medicine

## 2020-11-19 ENCOUNTER — Other Ambulatory Visit: Payer: Self-pay | Admitting: Family Medicine

## 2020-11-24 ENCOUNTER — Other Ambulatory Visit: Payer: Self-pay | Admitting: Family Medicine

## 2020-11-29 ENCOUNTER — Other Ambulatory Visit: Payer: Self-pay | Admitting: Family Medicine

## 2021-02-13 ENCOUNTER — Other Ambulatory Visit: Payer: Self-pay | Admitting: *Deleted

## 2021-02-15 NOTE — Telephone Encounter (Signed)
Twice calling to schedule appointment cant get in touch with patient has no voice mail.

## 2021-02-16 ENCOUNTER — Other Ambulatory Visit: Payer: Self-pay | Admitting: Family Medicine

## 2021-02-28 NOTE — Telephone Encounter (Signed)
Called twice and voice mail not setup

## 2021-03-01 ENCOUNTER — Telehealth: Payer: Self-pay | Admitting: Family Medicine

## 2021-03-01 DIAGNOSIS — E785 Hyperlipidemia, unspecified: Secondary | ICD-10-CM

## 2021-03-01 DIAGNOSIS — E119 Type 2 diabetes mellitus without complications: Secondary | ICD-10-CM

## 2021-03-01 DIAGNOSIS — Z79899 Other long term (current) drug therapy: Secondary | ICD-10-CM

## 2021-03-01 DIAGNOSIS — Z125 Encounter for screening for malignant neoplasm of prostate: Secondary | ICD-10-CM

## 2021-03-01 NOTE — Telephone Encounter (Signed)
Patient needing labs done has appointment on 4/8 for medication refill

## 2021-03-01 NOTE — Telephone Encounter (Signed)
Yes pls order those. Dr t

## 2021-03-01 NOTE — Telephone Encounter (Signed)
Last labs 09/05/20: Lipid, CMP, urine microprotien, CBC, PSA, HgbA1c

## 2021-03-01 NOTE — Telephone Encounter (Signed)
Blood work ordered in Epic. Patient notified. 

## 2021-03-02 NOTE — Telephone Encounter (Signed)
Patient scheduled appointment for 4/8

## 2021-03-09 ENCOUNTER — Ambulatory Visit (INDEPENDENT_AMBULATORY_CARE_PROVIDER_SITE_OTHER): Payer: 59 | Admitting: Family Medicine

## 2021-03-09 ENCOUNTER — Other Ambulatory Visit: Payer: Self-pay

## 2021-03-09 ENCOUNTER — Encounter: Payer: Self-pay | Admitting: Family Medicine

## 2021-03-09 VITALS — BP 130/70 | HR 90 | Temp 97.9°F | Ht 70.0 in | Wt 200.0 lb

## 2021-03-09 DIAGNOSIS — I252 Old myocardial infarction: Secondary | ICD-10-CM

## 2021-03-09 DIAGNOSIS — E1165 Type 2 diabetes mellitus with hyperglycemia: Secondary | ICD-10-CM | POA: Diagnosis not present

## 2021-03-09 DIAGNOSIS — I2510 Atherosclerotic heart disease of native coronary artery without angina pectoris: Secondary | ICD-10-CM

## 2021-03-09 DIAGNOSIS — I10 Essential (primary) hypertension: Secondary | ICD-10-CM

## 2021-03-09 MED ORDER — METFORMIN HCL 500 MG PO TABS: ORAL_TABLET | ORAL | 0 refills | Status: DC

## 2021-03-09 MED ORDER — LISINOPRIL 5 MG PO TABS: ORAL_TABLET | ORAL | 0 refills | Status: DC

## 2021-03-09 MED ORDER — ATORVASTATIN CALCIUM 80 MG PO TABS: ORAL_TABLET | ORAL | 0 refills | Status: DC

## 2021-03-09 NOTE — Progress Notes (Signed)
Patient ID: [name], male    DOB: [birth year], 50 y.o.   MRN: [id number]   Chief Complaint  Patient presents with  . Diabetes    Follow up and medication refills  . Hypertension    Follow up    Subjective:    HPI Diabetes- not checking sugar like he's supposed to.  Not checking and insurance didn't approve him for freestyle libre. Labs done today.  Eye exam- overdue for it. Hasn't had it in 2 yrs.  Pt has been out of metformin.    Pt stating he is aware of the long term damage he can get with diabetes being not controlled.  - his father and mother side of family with amputations and nerve damage in feet.   H/o cad and pt on lipitor with h/o nstemi and cabg x4.   HTN Pt compliant with BP meds.  No SEs Denies chest pain, sob, LE swelling, or blurry vision.   Medical History [place] has a past medical history of CAD (coronary artery disease), Diabetes mellitus without complication ([place]), and Hypertension.   Outpatient Encounter Medications as of 03/09/2021  Medication Sig  . acetaminophen (TYLENOL) 500 MG tablet Take 2 tablets (1,000 mg total) by mouth every 6 (six) hours as needed for mild pain or fever.  Marland Kitchen aspirin EC 81 MG tablet Take 81 mg by mouth daily.  . B-D UF III MINI PEN NEEDLES 31G X 5 MM MISC USE A CLEAN NEEDLE EVERY NIGHT AT BEDTIME TO INJECT INSULIN  . blood glucose meter kit and supplies Dispense based on patient and insurance preference. Use up to four times daily as directed. (FOR ICD-9 250.00, 250.01).  Marland Kitchen glucosamine-chondroitin 500-400 MG tablet Take 1 tablet by mouth daily.  Marland Kitchen glucose blood (CONTOUR NEXT TEST) test strip USE TO TEST BLOOD SUGAR FOUR TIMES DAILY  . insulin detemir (LEVEMIR FLEXTOUCH) 100 UNIT/ML FlexPen Inject 27 Units into the skin at bedtime.  . Insulin Pen Needle 31G X 6 MM MISC 1 Units by Does not apply route at bedtime. Please use clean needle every insulin administration  . metoprolol tartrate (LOPRESSOR) 25 MG tablet TAKE 1/2  TABLET BY MOUTH TWICE DAILY  . Microlet Lancets MISC USE TO TEST BLOOD SUGAR FOUR TIMES DAILY  . OVER THE COUNTER MEDICATION Vit d 3 and zinc  . [DISCONTINUED] atorvastatin (LIPITOR) 80 MG tablet TAKE 1 TABLET(80 MG) BY MOUTH DAILY AT 6 PM  . [DISCONTINUED] Continuous Blood Gluc Receiver (FREESTYLE LIBRE 14 DAY READER) DEVI Use as directed.  . [DISCONTINUED] Continuous Blood Gluc Sensor (FREESTYLE LIBRE 14 DAY SENSOR) MISC Use as directed.  . [DISCONTINUED] lisinopril (ZESTRIL) 5 MG tablet TAKE 1 TABLET(5 MG) BY MOUTH DAILY  . [DISCONTINUED] metFORMIN (GLUCOPHAGE) 500 MG tablet TAKE 1 TABLET(500 MG) BY MOUTH TWICE DAILY WITH A MEAL  . atorvastatin (LIPITOR) 80 MG tablet Take 1 tab p.o.  qhs.  . lisinopril (ZESTRIL) 5 MG tablet Take 1 tab p.o. daily.  . [DISCONTINUED] metFORMIN (GLUCOPHAGE) 500 MG tablet Take 1 tab p.o. bid with meals.   No facility-administered encounter medications on file as of 03/09/2021.     Review of Systems  Constitutional: Negative for chills and fever.  HENT: Negative for congestion, rhinorrhea and sore throat.   Respiratory: Negative for cough, shortness of breath and wheezing.   Cardiovascular: Negative for chest pain and leg swelling.  Gastrointestinal: Negative for abdominal pain, diarrhea, nausea and vomiting.  Genitourinary: Negative for dysuria and frequency.  Skin: Negative for  rash.  Neurological: Negative for dizziness, weakness and headaches.     Vitals BP 130/70   Pulse 90   Temp 97.9 F (36.6 C)   Ht '5\' 10"'  (1.778 m)   Wt 200 lb (90.7 kg)   SpO2 98%   BMI 28.70 kg/m   Objective:   Physical Exam Vitals reviewed.  Constitutional:      Appearance: Normal appearance.  HENT:     Head: Normocephalic.     Nose: Nose normal. No congestion.     Mouth/Throat:     Mouth: Mucous membranes are moist.     Pharynx: No oropharyngeal exudate.  Eyes:     Extraocular Movements: Extraocular movements intact.     Conjunctiva/sclera: Conjunctivae  normal.     Pupils: Pupils are equal, round, and reactive to light.  Cardiovascular:     Rate and Rhythm: Normal rate and regular rhythm.     Pulses: Normal pulses.     Heart sounds: Normal heart sounds. No murmur heard.   Pulmonary:     Effort: Pulmonary effort is normal. No respiratory distress.     Breath sounds: Normal breath sounds. No wheezing, rhonchi or rales.  Musculoskeletal:        General: Normal range of motion.     Right lower leg: No edema.     Left lower leg: No edema.  Skin:    General: Skin is warm and dry.     Findings: No rash.  Neurological:     General: No focal deficit present.     Mental Status: He is alert and oriented to person, place, and time.  Psychiatric:        Mood and Affect: Mood normal.        Behavior: Behavior normal.      Assessment and Plan   1. Uncontrolled type 2 diabetes mellitus with hyperglycemia ([place])  2. History of non-ST elevation myocardial infarction (NSTEMI)  3. Essential hypertension - lisinopril (ZESTRIL) 5 MG tablet; Take 1 tab p.o. daily.  Dispense: 30 tablet; Refill: 0  4. Coronary artery disease involving native coronary artery of native heart without angina pectoris - atorvastatin (LIPITOR) 80 MG tablet; Take 1 tab p.o.  qhs.  Dispense: 30 tablet; Refill: 0    dm2- uncontrolled.  Pt to cont with metformin and levemir. Labs weren't resulted yet. Pt was out of metformin.  Pt to pick up new script and cont to eat diabetic diet.  Will adjust meds after labs.   Last a1c- 9.4.  HLD /cad- unknown status- pt to cont meds.  htn- stable.  Cont with lisinopril 27m.  Return in about 3 months (around 06/08/2021) for f/u dm2, htn, hld.   04/01/2021

## 2021-03-10 LAB — CBC WITH DIFFERENTIAL/PLATELET
Basophils Absolute: 0 10*3/uL (ref 0.0–0.2)
Basos: 0 %
EOS (ABSOLUTE): 0.2 10*3/uL (ref 0.0–0.4)
Eos: 2 %
Hematocrit: 48.1 % (ref 37.5–51.0)
Hemoglobin: 16.4 g/dL (ref 13.0–17.7)
Immature Grans (Abs): 0 10*3/uL (ref 0.0–0.1)
Immature Granulocytes: 0 %
Lymphocytes Absolute: 2.7 10*3/uL (ref 0.7–3.1)
Lymphs: 28 %
MCH: 31.8 pg (ref 26.6–33.0)
MCHC: 34.1 g/dL (ref 31.5–35.7)
MCV: 93 fL (ref 79–97)
Monocytes Absolute: 0.6 10*3/uL (ref 0.1–0.9)
Monocytes: 6 %
Neutrophils Absolute: 6.3 10*3/uL (ref 1.4–7.0)
Neutrophils: 64 %
Platelets: 212 10*3/uL (ref 150–450)
RBC: 5.16 x10E6/uL (ref 4.14–5.80)
RDW: 12.1 % (ref 11.6–15.4)
WBC: 9.8 10*3/uL (ref 3.4–10.8)

## 2021-03-10 LAB — COMPREHENSIVE METABOLIC PANEL
ALT: 31 IU/L (ref 0–44)
AST: 16 IU/L (ref 0–40)
Albumin/Globulin Ratio: 2.1 (ref 1.2–2.2)
Albumin: 4.6 g/dL (ref 3.8–4.9)
Alkaline Phosphatase: 92 IU/L (ref 44–121)
BUN/Creatinine Ratio: 15 (ref 9–20)
BUN: 13 mg/dL (ref 6–24)
Bilirubin Total: 0.5 mg/dL (ref 0.0–1.2)
CO2: 24 mmol/L (ref 20–29)
Calcium: 9.6 mg/dL (ref 8.7–10.2)
Chloride: 101 mmol/L (ref 96–106)
Creatinine, Ser: 0.87 mg/dL (ref 0.76–1.27)
Globulin, Total: 2.2 g/dL (ref 1.5–4.5)
Glucose: 198 mg/dL — ABNORMAL HIGH (ref 65–99)
Potassium: 4.6 mmol/L (ref 3.5–5.2)
Sodium: 141 mmol/L (ref 134–144)
Total Protein: 6.8 g/dL (ref 6.0–8.5)
eGFR: 104 mL/min/{1.73_m2} (ref >59)

## 2021-03-10 LAB — LIPID PANEL
Chol/HDL Ratio: 3.5 ratio (ref 0.0–5.0)
Cholesterol, Total: 119 mg/dL (ref 100–199)
HDL: 34 mg/dL — ABNORMAL LOW (ref >39)
LDL Chol Calc (NIH): 62 mg/dL (ref 0–99)
Triglycerides: 129 mg/dL (ref 0–149)
VLDL Cholesterol Cal: 23 mg/dL (ref 5–40)

## 2021-03-10 LAB — PSA: Prostate Specific Ag, Serum: 0.9 ng/mL (ref 0.0–4.0)

## 2021-03-10 LAB — MICROALBUMIN / CREATININE URINE RATIO
Creatinine, Urine: 201.6 mg/dL
Microalb/Creat Ratio: 329 mg/g creat — ABNORMAL HIGH (ref 0–29)
Microalbumin, Urine: 662.7 ug/mL

## 2021-03-10 LAB — HEMOGLOBIN A1C
Est. average glucose Bld gHb Est-mCnc: 269 mg/dL
Hgb A1c MFr Bld: 11 % — ABNORMAL HIGH (ref 4.8–5.6)

## 2021-03-13 ENCOUNTER — Other Ambulatory Visit: Payer: Self-pay

## 2021-03-15 ENCOUNTER — Other Ambulatory Visit: Payer: Self-pay | Admitting: Family Medicine

## 2021-03-15 ENCOUNTER — Other Ambulatory Visit: Payer: Self-pay | Admitting: *Deleted

## 2021-03-15 DIAGNOSIS — Z79899 Other long term (current) drug therapy: Secondary | ICD-10-CM

## 2021-03-15 DIAGNOSIS — E785 Hyperlipidemia, unspecified: Secondary | ICD-10-CM

## 2021-03-15 DIAGNOSIS — E119 Type 2 diabetes mellitus without complications: Secondary | ICD-10-CM

## 2021-03-15 MED ORDER — METFORMIN HCL 1000 MG PO TABS: ORAL_TABLET | ORAL | 3 refills | Status: DC

## 2021-04-01 ENCOUNTER — Telehealth: Payer: Self-pay | Admitting: Family Medicine

## 2021-04-02 NOTE — Telephone Encounter (Signed)
Attempted to contact patient; pt has no voicemail set up at this time

## 2021-04-02 NOTE — Telephone Encounter (Signed)
Ok. Thanks.  Dr. Karie Schwalbe

## 2021-04-02 NOTE — Telephone Encounter (Signed)
Pt states he has checked it for past 6 days. Highest was 193 fasting. Average 115 -130 after taking shot. Taking 26 units of levemir. Pt states he is taking metformin 1,000 bid.

## 2021-04-07 ENCOUNTER — Other Ambulatory Visit: Payer: Self-pay | Admitting: Family Medicine

## 2021-04-09 ENCOUNTER — Other Ambulatory Visit: Payer: Self-pay

## 2021-04-09 ENCOUNTER — Telehealth: Payer: Self-pay

## 2021-04-09 DIAGNOSIS — E1165 Type 2 diabetes mellitus with hyperglycemia: Secondary | ICD-10-CM

## 2021-04-09 MED ORDER — BD PEN NEEDLE MINI U/F 31G X 5 MM MISC: 5 refills | Status: AC

## 2021-04-09 NOTE — Telephone Encounter (Signed)
Voicemail not set up , prescription request has been sent to patient pharmacy

## 2021-04-09 NOTE — Telephone Encounter (Signed)
Pt needs refill on B-D UF III MINI PEN NEEDLES 31G X 5 MM MISC  [id number] - [place], [place] - [address]   Pt call back [phone]

## 2021-04-13 ENCOUNTER — Other Ambulatory Visit: Payer: Self-pay | Admitting: Family Medicine

## 2021-04-13 DIAGNOSIS — I2510 Atherosclerotic heart disease of native coronary artery without angina pectoris: Secondary | ICD-10-CM

## 2021-05-04 ENCOUNTER — Other Ambulatory Visit: Payer: Self-pay | Admitting: Family Medicine

## 2021-05-04 ENCOUNTER — Other Ambulatory Visit: Payer: Self-pay

## 2021-05-04 DIAGNOSIS — I10 Essential (primary) hypertension: Secondary | ICD-10-CM

## 2021-05-04 MED ORDER — LISINOPRIL 5 MG PO TABS: ORAL_TABLET | ORAL | 0 refills | Status: DC

## 2021-05-15 ENCOUNTER — Other Ambulatory Visit: Payer: Self-pay

## 2021-05-15 ENCOUNTER — Encounter: Payer: Self-pay | Admitting: Cardiology

## 2021-05-15 ENCOUNTER — Ambulatory Visit (INDEPENDENT_AMBULATORY_CARE_PROVIDER_SITE_OTHER): Payer: 59 | Admitting: Cardiology

## 2021-05-15 VITALS — BP 118/78 | HR 82 | Ht 70.0 in | Wt 193.4 lb

## 2021-05-15 DIAGNOSIS — I2510 Atherosclerotic heart disease of native coronary artery without angina pectoris: Secondary | ICD-10-CM | POA: Diagnosis not present

## 2021-05-15 DIAGNOSIS — I10 Essential (primary) hypertension: Secondary | ICD-10-CM

## 2021-05-15 DIAGNOSIS — E118 Type 2 diabetes mellitus with unspecified complications: Secondary | ICD-10-CM | POA: Diagnosis not present

## 2021-05-15 DIAGNOSIS — E782 Mixed hyperlipidemia: Secondary | ICD-10-CM

## 2021-05-15 NOTE — Progress Notes (Signed)
Clinical Summary Mr. Meter is a 50 y.o.male  seen today for follow up of the following medical problems.   1. CAD - admit 05/2016 with NSTEMI, cath with multivessel CAD, s/p CABG 05/2016 with LIMA-LAD, SVG-Diag,SVG-OM, SVG-PDA. - 05/2016 echo LVEF 55-60%     - no recent chest pains, no sob/doe - compilant with meds   2. HTN - compliant with meds     3. Hyperlpidemia    07/2018 TC 109 TG 109 HDL 34 LDL 53 - compliant with statin    03/2021 TC 119 TG 129 HDL 34 LDL 62  4. 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 Recently lost job in April due [place] 19. Hoping to restart in next few weeks    Past Medical History:  Diagnosis Date   CAD (coronary artery disease)    a. 05/2016 - NSTEMI with cath showing multivessel CAD --> s/p CABG with LIMA-LAD, SVG-Diag,SVG-OM, SVG-PDA   Diabetes mellitus without complication (HCC)    Hypertension      No Known Allergies   Current Outpatient Medications  Medication Sig Dispense Refill   acetaminophen (TYLENOL) 500 MG tablet Take 2 tablets (1,000 mg total) by mouth every 6 (six) hours as needed for mild pain or fever. 30 tablet 0   aspirin EC 81 MG tablet Take 81 mg by mouth daily.     atorvastatin (LIPITOR) 80 MG tablet TAKE 1 TABLET BY MOUTH EVERY NIGHT AT BEDTIME 30 tablet 2   blood glucose meter kit and supplies Dispense based on patient and insurance preference. Use up to four times daily as directed. (FOR ICD-9 250.00, 250.01). 1 each 0   glucosamine-chondroitin 500-400 MG tablet Take 1 tablet by mouth daily.     glucose blood (CONTOUR NEXT TEST) test strip USE TO TEST BLOOD SUGAR FOUR TIMES DAILY 100 each 5   insulin detemir (LEVEMIR FLEXTOUCH) 100 UNIT/ML FlexPen Inject 27 Units into the skin at bedtime. 15 mL 2   Insulin Pen Needle (B-D UF III MINI PEN NEEDLES) 31G X 5 MM MISC USE A CLEAN NEEDLE EVERY NIGHT AT BEDTIME TO  INJECT INSULIN 100 each 5   Insulin Pen Needle 31G X 6 MM MISC 1 Units by Does not apply route at bedtime. Please use clean needle every insulin administration 100 each 0   lisinopril (ZESTRIL) 5 MG tablet Take 1 tab p.o. daily. 30 tablet 0   metFORMIN (GLUCOPHAGE) 1000 MG tablet Take one tablet po BID 60 tablet 3   metoprolol tartrate (LOPRESSOR) 25 MG tablet TAKE 1/2 TABLET BY MOUTH TWICE DAILY 30 tablet 0   Microlet Lancets MISC USE TO TEST BLOOD SUGAR FOUR TIMES DAILY 100 each 5   OVER THE COUNTER MEDICATION Vit d 3 and zinc     No current facility-administered medications for this visit.     Past Surgical History:  Procedure Laterality Date   APPENDECTOMY     CARDIAC CATHETERIZATION N/A 05/10/2016   Procedure: Left Heart Cath and Coronary Angiography;  Surgeon: Jettie Booze, MD;  Location: [facility];  Service: Cardiovascular;  Laterality: N/A;   CORONARY ARTERY BYPASS GRAFT N/A 05/13/2016   Procedure: CORONARY ARTERY BYPASS GRAFTING (CABG) X 4 UTILIZING THE LEFT INTERNAL MAMMARY ARTERY AND ENDOSCOPICALLY HARVESTED RIGHT  GREATER SAPHENEOUS VEIN.;  Surgeon: Ivin Poot, MD;  Location: [facility];  Service: Open Heart Surgery;  Laterality: N/A;  LIMA TO LAD SVG to Diagonal SVG to OM SVG to PDA   GANGLION CYST EXCISION Left 10/12/2015   Procedure: EXCISION MUCOID TUMOR LEFT MIDDLE FINGER ;  Surgeon: Daryll Brod, MD;  Location: [facility];  Service: Orthopedics;  Laterality: Left;  Left middle finger.   KNEE ARTHROSCOPY Left    TEE WITHOUT CARDIOVERSION N/A 05/13/2016   Procedure: TRANSESOPHAGEAL ECHOCARDIOGRAM (TEE);  Surgeon: Ivin Poot, MD;  Location: [facility];  Service: Open Heart Surgery;  Laterality: N/A;     No Known Allergies    Family History  Problem Relation Age of Onset   Diabetes Brother      Social History Mr. Holleman reports that he has been smoking. He has been smoking an average of 2.00 packs per day. He has never used smokeless  tobacco. Mr. Duthie reports no history of alcohol use.   Review of Systems CONSTITUTIONAL: No weight loss, fever, chills, weakness or fatigue.  HEENT: Eyes: No visual loss, blurred vision, double vision or yellow sclerae.No hearing loss, sneezing, congestion, runny nose or sore throat.  SKIN: No rash or itching.  CARDIOVASCULAR: per hpi RESPIRATORY: No shortness of breath, cough or sputum.  GASTROINTESTINAL: No anorexia, nausea, vomiting or diarrhea. No abdominal pain or blood.  GENITOURINARY: No burning on urination, no polyuria NEUROLOGICAL: No headache, dizziness, syncope, paralysis, ataxia, numbness or tingling in the extremities. No change in bowel or bladder control.  MUSCULOSKELETAL: No muscle, back pain, joint pain or stiffness.  LYMPHATICS: No enlarged nodes. No history of splenectomy.  PSYCHIATRIC: No history of depression or anxiety.  ENDOCRINOLOGIC: No reports of sweating, cold or heat intolerance. No polyuria or polydipsia.  Marland Kitchen   Physical Examination Today's Vitals   05/15/21 0818  BP: 118/78  Pulse: 82  SpO2: 97%  Weight: 193 lb 6.4 oz (87.7 kg)  Height: 5' 10" (1.778 m)   Body mass index is 27.75 kg/m.  Gen: resting comfortably, no acute distress HEENT: no scleral icterus, pupils equal round and reactive, no palptable cervical adenopathy,  CV: RRR, no m/r/g, no jvd Resp: Clear to auscultation bilaterally GI: abdomen is soft, non-tender, non-distended, normal bowel sounds, no hepatosplenomegaly MSK: extremities are warm, no edema.  Skin: warm, no rash Neuro:  no focal deficits Psych: appropriate affect   Diagnostic Studies  05/2016 echo Study Conclusions   - Left ventricle: The cavity size was normal. There was mild   concentric hypertrophy. Systolic function was normal. The   estimated ejection fraction was in the range of 55% to 60%. Wall   motion was normal; there were no regional wall motion   abnormalities. The transmitral flow pattern was normal.  Left   ventricular diastolic function parameters were normal. - Aortic valve: Trileaflet; normal thickness, mildly calcified   leaflets. - Mitral valve: There was trivial regurgitation.   05/2016 Cath Prox RCA to Mid RCA lesion, 80% stenosed. Nondominant vessel. Ost Cx to Prox Cx lesion, 80% stenosed. Mid Cx-1 lesion, 90% stenosed. Mid Cx-2 lesion, 75% stenosed. Ost 2nd Mrg to 2nd Mrg lesion, 95% stenosed. Small vessel. Ostial LAD to diastalLM lesion, 90% stenosed. Ulcerated lesion. Mid LAD lesion, 80% stenosed. The left ventricular systolic function is normal. Ost LPDA lesion, 90% stenosed.     05/2016  Carotid US Summary: The right internal carotid artery exhibits 1-39% stenosis. The left internal carotid artery exhibits elevated velocities suggestive of upper range 40-59% stenosis. The left vertebral artery is patent with antegrade  flow. Unable to visualize the right vertebral artery.   Bilateral ABIs are within normal limits at rest.      Prepared and Electronically Authenticated by     Assessment and Plan  1. CAD - denies any symptoms, continue current meds - EKG today shows SR, chronic RBBB no acute ischemic changes   2. HTN - he is at goal, continue current meds   3. Hyperlipidemia -LDL remains at goal, continue statin  4. DM2 - glycemic control per pcp - from cardiac standpoint continue ACEI, statin     Arnoldo Lenis, M.D.

## 2021-05-15 NOTE — Patient Instructions (Signed)
Medication Instructions:  Your physician recommends that you continue on your current medications as directed. Please refer to the Current Medication list given to you today.  *If you need a refill on your cardiac medications before your next appointment, please call your pharmacy*   Lab Work: NONE   If you have labs (blood work) drawn today and your tests are completely normal, you will receive your results only by: . MyChart Message (if you have MyChart) OR . A paper copy in the mail If you have any lab test that is abnormal or we need to change your treatment, we will call you to review the results.   Testing/Procedures: NONE    Follow-Up: At [facility], you and your health needs are our priority.  As part of our continuing mission to provide you with exceptional heart care, we have created designated Provider Care Teams.  These Care Teams include your primary Cardiologist (physician) and Advanced Practice Providers (APPs -  Physician Assistants and Nurse Practitioners) who all work together to provide you with the care you need, when you need it.  We recommend signing up for the patient portal called "MyChart".  Sign up information is provided on this After Visit Summary.  MyChart is used to connect with patients for Virtual Visits (Telemedicine).  Patients are able to view lab/test results, encounter notes, upcoming appointments, etc.  Non-urgent messages can be sent to your provider as well.   To learn more about what you can do with MyChart, go to [email or web address].    Your next appointment:   1 year(s)  The format for your next appointment:   In Person  Provider:   Jonathan Branch, MD   Other Instructions Thank you for choosing [facility]!    

## 2021-05-16 LAB — BASIC METABOLIC PANEL
BUN/Creatinine Ratio: 14 (ref 9–20)
BUN: 13 mg/dL (ref 6–24)
CO2: 20 mmol/L (ref 20–29)
Calcium: 9.7 mg/dL (ref 8.7–10.2)
Chloride: 100 mmol/L (ref 96–106)
Creatinine, Ser: 0.94 mg/dL (ref 0.76–1.27)
Glucose: 134 mg/dL — ABNORMAL HIGH (ref 65–99)
Potassium: 4.2 mmol/L (ref 3.5–5.2)
Sodium: 141 mmol/L (ref 134–144)
eGFR: 98 mL/min/{1.73_m2} (ref >59)

## 2021-05-16 LAB — HEMOGLOBIN A1C
Est. average glucose Bld gHb Est-mCnc: 186 mg/dL
Hgb A1c MFr Bld: 8.1 % — ABNORMAL HIGH (ref 4.8–5.6)

## 2021-06-05 ENCOUNTER — Other Ambulatory Visit: Payer: Self-pay

## 2021-06-05 ENCOUNTER — Ambulatory Visit (INDEPENDENT_AMBULATORY_CARE_PROVIDER_SITE_OTHER): Payer: 59 | Admitting: Family Medicine

## 2021-06-05 VITALS — BP 141/84 | HR 82 | Temp 97.2°F | Ht 70.0 in | Wt 195.0 lb

## 2021-06-05 DIAGNOSIS — I214 Non-ST elevation (NSTEMI) myocardial infarction: Secondary | ICD-10-CM | POA: Diagnosis not present

## 2021-06-05 DIAGNOSIS — E118 Type 2 diabetes mellitus with unspecified complications: Secondary | ICD-10-CM | POA: Diagnosis not present

## 2021-06-05 DIAGNOSIS — I10 Essential (primary) hypertension: Secondary | ICD-10-CM | POA: Diagnosis not present

## 2021-06-05 DIAGNOSIS — I2510 Atherosclerotic heart disease of native coronary artery without angina pectoris: Secondary | ICD-10-CM

## 2021-06-05 MED ORDER — ATORVASTATIN CALCIUM 80 MG PO TABS: ORAL_TABLET | ORAL | 2 refills | Status: DC

## 2021-06-05 MED ORDER — LEVEMIR FLEXTOUCH 100 UNIT/ML ~~LOC~~ SOPN: 27.0000 [IU] | PEN_INJECTOR | Freq: Every day | SUBCUTANEOUS | 2 refills | Status: DC

## 2021-06-05 MED ORDER — METFORMIN HCL 1000 MG PO TABS: 500.0000 mg | ORAL_TABLET | Freq: Two times a day (BID) | ORAL | 1 refills | Status: DC

## 2021-06-05 MED ORDER — METOPROLOL TARTRATE 25 MG PO TABS
12.5000 mg | ORAL_TABLET | Freq: Two times a day (BID) | ORAL | 1 refills | Status: DC
Start: 2021-06-05 — End: 2021-09-17

## 2021-06-05 NOTE — Progress Notes (Signed)
Patient ID: [name], male    DOB: 08/02/1971, 50 y.o.   MRN: [id number]   Chief Complaint  Patient presents with   Diabetes    Follow up on recent labs   Subjective:    HPI  Dm2- Compliant with medications. Checking blood glucose.   Not seeing any high or low numbers.  Denies polyuria or polydipsia.  Eye exam: overdue Foot exam: no new issues.  Checking home bg- mostly running 120s.  Highest was 143. Taking 53m metformin bid with meal and was on 10039mbid. Was decreasing carbs in diet and feeling "crap" low fatigue when on higher dose.   Eye exam - saw them 1.5 yrs ago.   If standing all day noticing some swelling.  Resolves after laying down and elevating of legs.  HTN Pt compliant with BP meds.  No SEs Denies chest pain, sob, LE swelling, or blurry vision.  HLD- doing well no new concerns.  Compliant with meds. No chest pain, palpitations, myalgias or joint pains.  Medical History [name] a past medical history of CAD (coronary artery disease), Diabetes mellitus without complication ([place] and Hypertension.   Outpatient Encounter Medications as of 06/05/2021  Medication Sig   acetaminophen (TYLENOL) 500 MG tablet Take 2 tablets (1,000 mg total) by mouth every 6 (six) hours as needed for mild pain or fever.   aspirin EC 81 MG tablet Take 81 mg by mouth daily.   atorvastatin (LIPITOR) 80 MG tablet TAKE 1 TABLET BY MOUTH EVERY NIGHT AT BEDTIME   blood glucose meter kit and supplies Dispense based on patient and insurance preference. Use up to four times daily as directed. (FOR ICD-9 250.00, 250.01).   glucosamine-chondroitin 500-400 MG tablet Take 1 tablet by mouth daily.   glucose blood (CONTOUR NEXT TEST) test strip USE TO TEST BLOOD SUGAR FOUR TIMES DAILY   insulin detemir (LEVEMIR FLEXTOUCH) 100 UNIT/ML FlexPen Inject 27 Units into the skin at bedtime.   Insulin Pen Needle (B-D UF III MINI PEN NEEDLES) 31G X 5 MM MISC USE A CLEAN NEEDLE EVERY NIGHT AT  BEDTIME TO INJECT INSULIN   Insulin Pen Needle 31G X 6 MM MISC 1 Units by Does not apply route at bedtime. Please use clean needle every insulin administration   lisinopril (ZESTRIL) 5 MG tablet Take 1 tab p.o. daily.   metFORMIN (GLUCOPHAGE) 1000 MG tablet Take 0.5 tablets (500 mg total) by mouth 2 (two) times daily with a meal. Take one tablet po BID   metoprolol tartrate (LOPRESSOR) 25 MG tablet Take 0.5 tablets (12.5 mg total) by mouth 2 (two) times daily.   Microlet Lancets MISC USE TO TEST BLOOD SUGAR FOUR TIMES DAILY   OVER THE COUNTER MEDICATION Vit d 3 and zinc   [DISCONTINUED] atorvastatin (LIPITOR) 80 MG tablet TAKE 1 TABLET BY MOUTH EVERY NIGHT AT BEDTIME   [DISCONTINUED] insulin detemir (LEVEMIR FLEXTOUCH) 100 UNIT/ML FlexPen Inject 27 Units into the skin at bedtime.   [DISCONTINUED] lisinopril (ZESTRIL) 5 MG tablet Take 1 tab p.o. daily.   [DISCONTINUED] metFORMIN (GLUCOPHAGE) 1000 MG tablet Take one tablet po BID (Patient taking differently: 500 mg 2 (two) times daily with a meal. Take one tablet po BID)   [DISCONTINUED] metoprolol tartrate (LOPRESSOR) 25 MG tablet TAKE 1/2 TABLET BY MOUTH TWICE DAILY   No facility-administered encounter medications on file as of 06/05/2021.     Review of Systems  Constitutional:  Negative for chills and fever.  HENT:  Negative for congestion,  rhinorrhea and sore throat.   Respiratory:  Negative for cough, shortness of breath and wheezing.   Cardiovascular:  Negative for chest pain and leg swelling.  Gastrointestinal:  Negative for abdominal pain, diarrhea, nausea and vomiting.  Genitourinary:  Negative for dysuria and frequency.  Skin:  Negative for rash.  Neurological:  Negative for dizziness, weakness and headaches.    Vitals BP (!) 141/84   Pulse 82   Temp (!) 97.2 F (36.2 C) (Oral)   Ht '5\' 10"'  (1.778 m)   Wt 195 lb (88.5 kg)   SpO2 99%   BMI 27.98 kg/m   Objective:   Physical Exam Vitals and nursing note reviewed.   Constitutional:      General: He is not in acute distress.    Appearance: Normal appearance. He is not ill-appearing.  Cardiovascular:     Rate and Rhythm: Normal rate and regular rhythm.     Pulses: Normal pulses.     Heart sounds: Normal heart sounds.  Pulmonary:     Effort: Pulmonary effort is normal. No respiratory distress.     Breath sounds: Normal breath sounds.  Musculoskeletal:        General: Normal range of motion.  Skin:    General: Skin is warm and dry.     Findings: No rash.  Neurological:     General: No focal deficit present.     Mental Status: He is alert and oriented to person, place, and time.  Psychiatric:        Mood and Affect: Mood normal.        Behavior: Behavior normal.        Thought Content: Thought content normal.        Judgment: Judgment normal.     Assessment and Plan   1. Diabetes mellitus with complication (HCC) - insulin detemir (LEVEMIR FLEXTOUCH) 100 UNIT/ML FlexPen; Inject 27 Units into the skin at bedtime.  Dispense: 15 mL; Refill: 2 - metFORMIN (GLUCOPHAGE) 1000 MG tablet; Take 0.5 tablets (500 mg total) by mouth 2 (two) times daily with a meal. Take one tablet po BID  Dispense: 90 tablet; Refill: 1  2. Coronary artery disease involving native coronary artery of native heart without angina pectoris - atorvastatin (LIPITOR) 80 MG tablet; TAKE 1 TABLET BY MOUTH EVERY NIGHT AT BEDTIME  Dispense: 30 tablet; Refill: 2  3. NSTEMI (non-ST elevated myocardial infarction) ([place])  4. Essential hypertension - metoprolol tartrate (LOPRESSOR) 25 MG tablet; Take 0.5 tablets (12.5 mg total) by mouth 2 (two) times daily.  Dispense: 90 tablet; Refill: 1 - lisinopril (ZESTRIL) 5 MG tablet; Take 1 tab p.o. daily.  Dispense: 30 tablet; Refill: 2   HM- Would like to see about cologuard.  Wanting info on cologuard.  Will go to pharmacy for pneumonia vaccine and shingrix.  Pt to call to get eye exam, over due.  Dm2- elevated a1c 8.1, however improving  from last a1c in 4/22- was 11.0. pt to cont to dec carbs, cont meds.  Htn- suboptimal. Cont meds. Dec salt.  Cad/ h/o nstemi- cont metoprolol and zocor.  HLD- stable. Cont meds.  Return in about 6 months (around 12/06/2021) for f/u dm2, htn, cad.

## 2021-06-17 MED ORDER — LISINOPRIL 5 MG PO TABS: ORAL_TABLET | ORAL | 2 refills | Status: DC

## 2021-07-10 ENCOUNTER — Other Ambulatory Visit: Payer: Self-pay | Admitting: Family Medicine

## 2021-07-10 DIAGNOSIS — I10 Essential (primary) hypertension: Secondary | ICD-10-CM

## 2021-09-16 ENCOUNTER — Other Ambulatory Visit: Payer: Self-pay | Admitting: Family Medicine

## 2021-09-16 DIAGNOSIS — I10 Essential (primary) hypertension: Secondary | ICD-10-CM

## 2021-10-14 ENCOUNTER — Other Ambulatory Visit: Payer: Self-pay | Admitting: Family Medicine

## 2021-10-14 DIAGNOSIS — I10 Essential (primary) hypertension: Secondary | ICD-10-CM

## 2021-12-10 ENCOUNTER — Other Ambulatory Visit: Payer: Self-pay | Admitting: Family Medicine

## 2021-12-10 DIAGNOSIS — I10 Essential (primary) hypertension: Secondary | ICD-10-CM

## 2021-12-13 NOTE — Telephone Encounter (Signed)
Patient has appointment on 12/18/21 for medication follow up but needs refill completely out

## 2021-12-18 ENCOUNTER — Ambulatory Visit: Payer: Self-pay | Admitting: Family Medicine

## 2021-12-26 ENCOUNTER — Ambulatory Visit (INDEPENDENT_AMBULATORY_CARE_PROVIDER_SITE_OTHER): Payer: BC Managed Care – PPO | Admitting: Family Medicine

## 2021-12-26 ENCOUNTER — Encounter: Payer: Self-pay | Admitting: Family Medicine

## 2021-12-26 ENCOUNTER — Other Ambulatory Visit: Payer: Self-pay

## 2021-12-26 VITALS — BP 142/74 | HR 90 | Temp 98.3°F | Wt 196.2 lb

## 2021-12-26 DIAGNOSIS — Z130 Encounter for screening for diseases of the blood and blood-forming organs and certain disorders involving the immune mechanism: Secondary | ICD-10-CM

## 2021-12-26 DIAGNOSIS — E118 Type 2 diabetes mellitus with unspecified complications: Secondary | ICD-10-CM | POA: Diagnosis not present

## 2021-12-26 DIAGNOSIS — E1169 Type 2 diabetes mellitus with other specified complication: Secondary | ICD-10-CM

## 2021-12-26 DIAGNOSIS — I10 Essential (primary) hypertension: Secondary | ICD-10-CM

## 2021-12-26 DIAGNOSIS — E785 Hyperlipidemia, unspecified: Secondary | ICD-10-CM

## 2021-12-26 DIAGNOSIS — I2510 Atherosclerotic heart disease of native coronary artery without angina pectoris: Secondary | ICD-10-CM

## 2021-12-26 DIAGNOSIS — Z125 Encounter for screening for malignant neoplasm of prostate: Secondary | ICD-10-CM

## 2021-12-26 MED ORDER — LEVEMIR FLEXTOUCH 100 UNIT/ML ~~LOC~~ SOPN: 27.0000 [IU] | PEN_INJECTOR | Freq: Every day | SUBCUTANEOUS | 3 refills | Status: AC

## 2021-12-26 MED ORDER — METFORMIN HCL 500 MG PO TABS: 500.0000 mg | ORAL_TABLET | Freq: Two times a day (BID) | ORAL | 3 refills | Status: AC

## 2021-12-26 MED ORDER — LISINOPRIL 5 MG PO TABS: 5.0000 mg | ORAL_TABLET | Freq: Every day | ORAL | 3 refills | Status: AC

## 2021-12-26 MED ORDER — METOPROLOL TARTRATE 25 MG PO TABS: ORAL_TABLET | ORAL | 3 refills | Status: AC

## 2021-12-26 NOTE — Assessment & Plan Note (Signed)
Last A1c revealed poor control.  He has made dietary/lifestyle changes.  He is compliant with his medication.  Continue insulin and metformin.  A1c ordered.

## 2021-12-26 NOTE — Progress Notes (Signed)
Subjective:  Patient ID: [name], male    DOB: May 22, 1971  Age: 51 y.o. MRN: [id number]  CC: Chief Complaint  Patient presents with   Establish Care   HPI:  51 year old male with CAD s/p CABG, HTN, HLD, DM-2, Tobacco abuse presents for follow up.  DM-2 Patient states that he does not check his blood sugar every day.  However, when he checks it, readings usually range from the 120s to 130s.  He is on Levemir 27 units nightly.  He is also on metformin 500 mg twice daily.  He was previously taking higher dose of metformin which caused GI side effects. He states that he has made dietary changes which have helped his blood sugar control. Needs A1c.  Hypertension Has been stable.  BP slightly elevated today.  He is on lisinopril 5 mg daily.  Needs labs.  Hyperlipidemia Compliant with Lipitor.  Needs labs.  CAD No recent chest pain or shortness of breath.  He is doing well at this time.  However, he continues to smoke.  Patient Active Problem List   Diagnosis Date Noted   Hyperlipidemia associated with type 2 diabetes mellitus ([place]) 12/26/2021   CAD (coronary artery disease)    S/P CABG x 4 05/13/2016   NSTEMI (non-ST elevated myocardial infarction) ([place])    Tobacco abuse 05/09/2016   Essential hypertension    Diabetes mellitus with complication [facility])     Social Hx   Social History   Socioeconomic History   Marital status: Married    Spouse name: Not on file   Number of children: Not on file   Years of education: Not on file   Highest education level: Not on file  Occupational History   Not on file  Tobacco Use   Smoking status: Every Day    Packs/day: 2.00    Types: Cigarettes    Last attempt to quit: 05/10/2016    Years since quitting: 5.6   Smokeless tobacco: Never  Vaping Use   Vaping Use: Never used  Substance and Sexual Activity   Alcohol use: No    Alcohol/week: 0.0 standard drinks   Drug use: Not Currently    Types: Marijuana    Comment:  occasionally marjuana   Sexual activity: Not on file  Other Topics Concern   Not on file  Social History Narrative   Not on file   Social Determinants of Health   Financial Resource Strain: Not on file  Food Insecurity: Not on file  Transportation Needs: Not on file  Physical Activity: Not on file  Stress: Not on file  Social Connections: Not on file    Review of Systems  Constitutional: Negative.   Respiratory: Negative.    Cardiovascular: Negative.    Objective:  BP (!) 142/74    Pulse 90    Temp 98.3 F (36.8 C)    Wt 196 lb 3.2 oz (89 kg)    SpO2 97%    BMI 28.15 kg/m   BP/Weight 12/26/2021 06/05/2021 7/56/4332  Systolic BP 951 884 166  Diastolic BP 74 84 78  Wt. (Lbs) 196.2 195 193.4  BMI 28.15 27.98 27.75    Physical Exam Vitals and nursing note reviewed.  Constitutional:      General: He is not in acute distress.    Appearance: Normal appearance. He is not ill-appearing.  HENT:     Head: Normocephalic and atraumatic.  Eyes:     General:  Right eye: No discharge.        Left eye: No discharge.     Conjunctiva/sclera: Conjunctivae normal.  Cardiovascular:     Rate and Rhythm: Normal rate and regular rhythm.  Pulmonary:     Effort: Pulmonary effort is normal.     Breath sounds: Normal breath sounds. No wheezing, rhonchi or rales.  Neurological:     Mental Status: He is alert.  Psychiatric:        Mood and Affect: Mood normal.        Behavior: Behavior normal.    Lab Results  Component Value Date   WBC 9.8 03/09/2021   HGB 16.4 03/09/2021   HCT 48.1 03/09/2021   PLT 212 03/09/2021   GLUCOSE 134 (H) 05/15/2021   CHOL 119 03/09/2021   TRIG 129 03/09/2021   HDL 34 (L) 03/09/2021   LDLCALC 62 03/09/2021   ALT 31 03/09/2021   AST 16 03/09/2021   NA 141 05/15/2021   K 4.2 05/15/2021   CL 100 05/15/2021   CREATININE 0.94 05/15/2021   BUN 13 05/15/2021   CO2 20 05/15/2021   INR 1.27 05/13/2016   HGBA1C 8.1 (H) 05/15/2021     Assessment &  Plan:   Problem List Items Addressed This Visit       Cardiovascular and Mediastinum   Essential hypertension    BP slightly elevated today.  We will continue to monitor.  Continue lisinopril.      Relevant Medications   lisinopril (ZESTRIL) 5 MG tablet   metoprolol tartrate (LOPRESSOR) 25 MG tablet   Other Relevant Orders   CMP14+EGFR   CAD (coronary artery disease)    Stable.  No current symptoms.      Relevant Medications   lisinopril (ZESTRIL) 5 MG tablet   metoprolol tartrate (LOPRESSOR) 25 MG tablet     Endocrine   Diabetes mellitus with complication (HCC) - Primary    Last A1c revealed poor control.  He has made dietary/lifestyle changes.  He is compliant with his medication.  Continue insulin and metformin.  A1c ordered.      Relevant Medications   insulin detemir (LEVEMIR FLEXTOUCH) 100 UNIT/ML FlexPen   lisinopril (ZESTRIL) 5 MG tablet   metFORMIN (GLUCOPHAGE) 500 MG tablet   Other Relevant Orders   CMP14+EGFR   Hemoglobin A1c   Hyperlipidemia associated with type 2 diabetes mellitus ([place])    Lipid panel ordered.  Continue Lipitor.      Relevant Medications   insulin detemir (LEVEMIR FLEXTOUCH) 100 UNIT/ML FlexPen   lisinopril (ZESTRIL) 5 MG tablet   metFORMIN (GLUCOPHAGE) 500 MG tablet   metoprolol tartrate (LOPRESSOR) 25 MG tablet   Other Relevant Orders   Lipid panel   Other Visit Diagnoses     Screening for deficiency anemia       Relevant Orders   CBC   Screening for prostate cancer       Relevant Orders   PSA      Follow-up:  Return in about 6 months (around 06/25/2022).  [place]

## 2021-12-26 NOTE — Patient Instructions (Signed)
Labs ordered.  I am refilling your medication.  Follow up in 6 months.

## 2021-12-26 NOTE — Assessment & Plan Note (Signed)
Stable. No current symptoms.

## 2021-12-26 NOTE — Assessment & Plan Note (Signed)
Lipid panel ordered.  Continue Lipitor. 

## 2021-12-26 NOTE — Assessment & Plan Note (Signed)
BP slightly elevated today.  We will continue to monitor.  Continue lisinopril.

## 2021-12-28 LAB — CMP14+EGFR
ALT: 36 IU/L (ref 0–44)
AST: 21 IU/L (ref 0–40)
Albumin/Globulin Ratio: 2.4 — ABNORMAL HIGH (ref 1.2–2.2)
Albumin: 4.6 g/dL (ref 3.8–4.9)
Alkaline Phosphatase: 77 IU/L (ref 44–121)
BUN/Creatinine Ratio: 13 (ref 9–20)
BUN: 12 mg/dL (ref 6–24)
Bilirubin Total: 0.4 mg/dL (ref 0.0–1.2)
CO2: 27 mmol/L (ref 20–29)
Calcium: 9.5 mg/dL (ref 8.7–10.2)
Chloride: 100 mmol/L (ref 96–106)
Creatinine, Ser: 0.93 mg/dL (ref 0.76–1.27)
Globulin, Total: 1.9 g/dL (ref 1.5–4.5)
Glucose: 198 mg/dL — ABNORMAL HIGH (ref 70–99)
Potassium: 4.4 mmol/L (ref 3.5–5.2)
Sodium: 141 mmol/L (ref 134–144)
Total Protein: 6.5 g/dL (ref 6.0–8.5)
eGFR: 99 mL/min/{1.73_m2} (ref >59)

## 2021-12-28 LAB — LIPID PANEL
Chol/HDL Ratio: 3.8 ratio (ref 0.0–5.0)
Cholesterol, Total: 129 mg/dL (ref 100–199)
HDL: 34 mg/dL — ABNORMAL LOW (ref >39)
LDL Chol Calc (NIH): 69 mg/dL (ref 0–99)
Triglycerides: 149 mg/dL (ref 0–149)
VLDL Cholesterol Cal: 26 mg/dL (ref 5–40)

## 2021-12-28 LAB — CBC
Hematocrit: 48.6 % (ref 37.5–51.0)
Hemoglobin: 16.6 g/dL (ref 13.0–17.7)
MCH: 31.7 pg (ref 26.6–33.0)
MCHC: 34.2 g/dL (ref 31.5–35.7)
MCV: 93 fL (ref 79–97)
Platelets: 211 10*3/uL (ref 150–450)
RBC: 5.23 x10E6/uL (ref 4.14–5.80)
RDW: 11.8 % (ref 11.6–15.4)
WBC: 10.2 10*3/uL (ref 3.4–10.8)

## 2021-12-28 LAB — PSA: Prostate Specific Ag, Serum: 0.7 ng/mL (ref 0.0–4.0)

## 2021-12-28 LAB — HEMOGLOBIN A1C
Est. average glucose Bld gHb Est-mCnc: 203 mg/dL
Hgb A1c MFr Bld: 8.7 % — ABNORMAL HIGH (ref 4.8–5.6)

## 2022-01-09 ENCOUNTER — Telehealth: Payer: Self-pay | Admitting: Family Medicine

## 2022-01-09 DIAGNOSIS — I2510 Atherosclerotic heart disease of native coronary artery without angina pectoris: Secondary | ICD-10-CM

## 2022-01-09 MED ORDER — ATORVASTATIN CALCIUM 80 MG PO TABS: ORAL_TABLET | ORAL | 1 refills | Status: AC

## 2022-01-09 NOTE — Telephone Encounter (Signed)
Pt wife called and stated that they had switched to [facility] pharmacy but the Atorvastatin 80 mg was not called in but his other rx were.   [phone]

## 2022-01-09 NOTE — Telephone Encounter (Signed)
Atorvastatin sent to [facility] and wife [facility]) is aware

## 2022-06-25 ENCOUNTER — Ambulatory Visit (INDEPENDENT_AMBULATORY_CARE_PROVIDER_SITE_OTHER): Payer: BC Managed Care – PPO | Admitting: Family Medicine

## 2022-06-25 ENCOUNTER — Encounter: Payer: Self-pay | Admitting: Family Medicine

## 2022-06-25 VITALS — BP 124/78 | HR 83 | Temp 97.9°F | Wt 193.6 lb

## 2022-06-25 DIAGNOSIS — R5383 Other fatigue: Secondary | ICD-10-CM | POA: Insufficient documentation

## 2022-06-25 DIAGNOSIS — E118 Type 2 diabetes mellitus with unspecified complications: Secondary | ICD-10-CM

## 2022-06-25 DIAGNOSIS — E1169 Type 2 diabetes mellitus with other specified complication: Secondary | ICD-10-CM | POA: Diagnosis not present

## 2022-06-25 DIAGNOSIS — E785 Hyperlipidemia, unspecified: Secondary | ICD-10-CM

## 2022-06-25 DIAGNOSIS — F5101 Primary insomnia: Secondary | ICD-10-CM

## 2022-06-25 DIAGNOSIS — Z130 Encounter for screening for diseases of the blood and blood-forming organs and certain disorders involving the immune mechanism: Secondary | ICD-10-CM

## 2022-06-25 DIAGNOSIS — I2510 Atherosclerotic heart disease of native coronary artery without angina pectoris: Secondary | ICD-10-CM | POA: Diagnosis not present

## 2022-06-25 DIAGNOSIS — Z720 Tobacco use: Secondary | ICD-10-CM

## 2022-06-25 DIAGNOSIS — I10 Essential (primary) hypertension: Secondary | ICD-10-CM

## 2022-06-25 DIAGNOSIS — G4700 Insomnia, unspecified: Secondary | ICD-10-CM | POA: Insufficient documentation

## 2022-06-25 MED ORDER — FREESTYLE LIBRE 3 SENSOR MISC: 6 refills | Status: DC

## 2022-06-25 MED ORDER — TRAZODONE HCL 50 MG PO TABS: 25.0000 mg | ORAL_TABLET | Freq: Every evening | ORAL | 3 refills | Status: DC | PRN

## 2022-06-25 NOTE — Assessment & Plan Note (Signed)
Stable.  Continue lisinopril and metoprolol. 

## 2022-06-25 NOTE — Assessment & Plan Note (Signed)
Trial of Trazodone. °

## 2022-06-25 NOTE — Assessment & Plan Note (Signed)
Unsure of control. Labs ordered. Continue current dosing of metformin and insulin while awaiting labs.

## 2022-06-25 NOTE — Assessment & Plan Note (Signed)
Not interested in cessation

## 2022-06-25 NOTE — Assessment & Plan Note (Signed)
At goal. Continue statin.

## 2022-06-25 NOTE — Assessment & Plan Note (Signed)
Asymptomatic. Continue Aspirin, Statin, ACEI and Beta blocker.

## 2022-06-25 NOTE — Patient Instructions (Addendum)
Labs ordered (need to be done first thing in the morning).  Medication as directed for sleep.  Follow up in 6 months (pending lab results).  Take care  Dr. Adriana Simas

## 2022-06-25 NOTE — Progress Notes (Signed)
Subjective:  Patient ID: [name], male    DOB: November 13, 1971  Age: 51 y.o. MRN: [id number]  CC: Chief Complaint  Patient presents with   Hypertension    No issues    Diabetes    Sugars have been pretty good.     HPI:  51 year old male with CAD s/p CABG, uncontrolled DM, HLD, tobacco abuse presents for follow up.  Reports fatigue and also states that he is experiencing insomnia. Concerned about low Testosterone.  No chest pain or SOB. Still continues to smoke. Not interested in quitting.  Last A1C was not at goal. Needs A1C. Has made lifestyle changes. Continues on Metformin and Levemir 27 units. Does not check sugars regularly.   HLD has been at goal on Lipitor.   HTN stable on lisinopril and metoprolol.   Patient Active Problem List   Diagnosis Date Noted   Fatigue 06/25/2022   Insomnia 06/25/2022   Hyperlipidemia associated with type 2 diabetes mellitus ([place]) 12/26/2021   CAD (coronary artery disease)    S/P CABG x 4 05/13/2016   Tobacco abuse 05/09/2016   Essential hypertension    Diabetes mellitus with complication [facility])     Social Hx   Social History   Socioeconomic History   Marital status: Married    Spouse name: Not on file   Number of children: Not on file   Years of education: Not on file   Highest education level: Not on file  Occupational History   Not on file  Tobacco Use   Smoking status: Every Day    Packs/day: 2.00    Types: Cigarettes    Last attempt to quit: 05/10/2016    Years since quitting: 6.1   Smokeless tobacco: Never  Vaping Use   Vaping Use: Never used  Substance and Sexual Activity   Alcohol use: No    Alcohol/week: 0.0 standard drinks of alcohol   Drug use: Not Currently    Types: Marijuana    Comment: occasionally marjuana   Sexual activity: Not on file  Other Topics Concern   Not on file  Social History Narrative   Not on file   Social Determinants of Health   Financial Resource Strain: Not on file  Food  Insecurity: Not on file  Transportation Needs: Not on file  Physical Activity: Not on file  Stress: Not on file  Social Connections: Not on file    Review of Systems Per HPI  Objective:  BP 124/78   Pulse 83   Temp 97.9 F (36.6 C)   Wt 193 lb 9.6 oz (87.8 kg)   SpO2 98%   BMI 27.78 kg/m      06/25/2022    3:49 PM 12/26/2021    8:38 AM 06/05/2021    9:01 AM  BP/Weight  Systolic BP 518 841 660  Diastolic BP 78 74 84  Wt. (Lbs) 193.6 196.2 195  BMI 27.78 kg/m2 28.15 kg/m2 27.98 kg/m2    Physical Exam Vitals and nursing note reviewed.  Constitutional:      General: He is not in acute distress.    Appearance: Normal appearance. He is not ill-appearing.  HENT:     Head: Normocephalic and atraumatic.  Eyes:     General:        Right eye: No discharge.        Left eye: No discharge.     Conjunctiva/sclera: Conjunctivae normal.  Cardiovascular:     Rate and Rhythm: Normal rate and  regular rhythm.  Pulmonary:     Effort: Pulmonary effort is normal.     Breath sounds: Normal breath sounds. No wheezing, rhonchi or rales.  Neurological:     Mental Status: He is alert.  Psychiatric:        Mood and Affect: Mood normal.        Behavior: Behavior normal.     Lab Results  Component Value Date   WBC 10.2 12/27/2021   HGB 16.6 12/27/2021   HCT 48.6 12/27/2021   PLT 211 12/27/2021   GLUCOSE 198 (H) 12/27/2021   CHOL 129 12/27/2021   TRIG 149 12/27/2021   HDL 34 (L) 12/27/2021   LDLCALC 69 12/27/2021   ALT 36 12/27/2021   AST 21 12/27/2021   NA 141 12/27/2021   K 4.4 12/27/2021   CL 100 12/27/2021   CREATININE 0.93 12/27/2021   BUN 12 12/27/2021   CO2 27 12/27/2021   INR 1.27 05/13/2016   HGBA1C 8.7 (H) 12/27/2021     Assessment & Plan:   Problem List Items Addressed This Visit       Cardiovascular and Mediastinum   CAD (coronary artery disease) - Primary    Asymptomatic. Continue Aspirin, Statin, ACEI and Beta blocker.       Essential hypertension     Stable. Continue lisinopril and metoprolol.         Endocrine   Diabetes mellitus with complication (HCC)    Unsure of control. Labs ordered. Continue current dosing of metformin and insulin while awaiting labs.      Relevant Orders   CMP14+EGFR   Hemoglobin A1c   Microalbumin / creatinine urine ratio   Hyperlipidemia associated with type 2 diabetes mellitus ([place])    At goal. Continue statin.      Relevant Orders   Lipid panel     Other   Fatigue   Relevant Orders   Testosterone Total,Free,Bio, Males   Insomnia    Trial of Trazodone.      Tobacco abuse    Not interested in cessation.      Other Visit Diagnoses     Screening for deficiency anemia       Relevant Orders   CBC       Meds ordered this encounter  Medications   traZODone (DESYREL) 50 MG tablet    Sig: Take 0.5-1 tablets (25-50 mg total) by mouth at bedtime as needed for sleep.    Dispense:  30 tablet    Refill:  3   Continuous Blood Gluc Sensor (FREESTYLE LIBRE 3 SENSOR) MISC    Sig: Place 1 sensor on the skin every 14 days. Use to check glucose continuously    Dispense:  2 each    Refill:  6    Follow-up:  Return in about 6 months (around 12/26/2022).  [place]

## 2022-06-26 ENCOUNTER — Telehealth: Payer: Self-pay | Admitting: *Deleted

## 2022-06-26 NOTE — Telephone Encounter (Signed)
PA for [employer] 3 Sensor approved by insurance. Approval good 06/26/22-06/26/23  Pharmacy notified

## 2022-07-16 LAB — CBC
Hematocrit: 48.2 % (ref 37.5–51.0)
Hemoglobin: 16.5 g/dL (ref 13.0–17.7)
MCH: 32.2 pg (ref 26.6–33.0)
MCHC: 34.2 g/dL (ref 31.5–35.7)
MCV: 94 fL (ref 79–97)
Platelets: 245 10*3/uL (ref 150–450)
RBC: 5.13 x10E6/uL (ref 4.14–5.80)
RDW: 12.2 % (ref 11.6–15.4)
WBC: 9.6 10*3/uL (ref 3.4–10.8)

## 2022-07-16 LAB — MICROALBUMIN / CREATININE URINE RATIO
Creatinine, Urine: 148.3 mg/dL
Microalb/Creat Ratio: 207 mg/g creat — ABNORMAL HIGH (ref 0–29)
Microalbumin, Urine: 307 ug/mL

## 2022-07-16 LAB — CMP14+EGFR
ALT: 27 IU/L (ref 0–44)
AST: 16 IU/L (ref 0–40)
Albumin/Globulin Ratio: 2.2 (ref 1.2–2.2)
Albumin: 4.7 g/dL (ref 3.8–4.9)
Alkaline Phosphatase: 82 IU/L (ref 44–121)
BUN/Creatinine Ratio: 17 (ref 9–20)
BUN: 17 mg/dL (ref 6–24)
Bilirubin Total: 0.4 mg/dL (ref 0.0–1.2)
CO2: 23 mmol/L (ref 20–29)
Calcium: 9.9 mg/dL (ref 8.7–10.2)
Chloride: 106 mmol/L (ref 96–106)
Creatinine, Ser: 0.99 mg/dL (ref 0.76–1.27)
Globulin, Total: 2.1 g/dL (ref 1.5–4.5)
Glucose: 156 mg/dL — ABNORMAL HIGH (ref 70–99)
Potassium: 5.4 mmol/L — ABNORMAL HIGH (ref 3.5–5.2)
Sodium: 143 mmol/L (ref 134–144)
Total Protein: 6.8 g/dL (ref 6.0–8.5)
eGFR: 92 mL/min/{1.73_m2} (ref >59)

## 2022-07-16 LAB — LIPID PANEL
Chol/HDL Ratio: 5.3 ratio — ABNORMAL HIGH (ref 0.0–5.0)
Cholesterol, Total: 205 mg/dL — ABNORMAL HIGH (ref 100–199)
HDL: 39 mg/dL — ABNORMAL LOW (ref >39)
LDL Chol Calc (NIH): 128 mg/dL — ABNORMAL HIGH (ref 0–99)
Triglycerides: 213 mg/dL — ABNORMAL HIGH (ref 0–149)
VLDL Cholesterol Cal: 38 mg/dL (ref 5–40)

## 2022-07-16 LAB — HEMOGLOBIN A1C
Est. average glucose Bld gHb Est-mCnc: 174 mg/dL
Hgb A1c MFr Bld: 7.7 % — ABNORMAL HIGH (ref 4.8–5.6)

## 2022-12-26 ENCOUNTER — Ambulatory Visit: Payer: BC Managed Care – PPO | Admitting: Family Medicine

## 2023-12-25 ENCOUNTER — Telehealth: Payer: Self-pay | Admitting: Cardiology

## 2023-12-25 NOTE — Telephone Encounter (Signed)
Called [name] in regards to recall for Dr. Wyline Mood- [name] did not wish to sch apt at this time, stated he would have to call back, he has to much going on at this time

## 2024-04-05 ENCOUNTER — Encounter: Payer: Self-pay | Admitting: Orthopedic Surgery

## 2024-04-05 ENCOUNTER — Ambulatory Visit: Payer: Self-pay | Admitting: Orthopedic Surgery

## 2024-04-05 VITALS — BP 188/114 | HR 78 | Ht 70.0 in | Wt 193.0 lb

## 2024-04-05 DIAGNOSIS — M19041 Primary osteoarthritis, right hand: Secondary | ICD-10-CM

## 2024-04-05 DIAGNOSIS — M19042 Primary osteoarthritis, left hand: Secondary | ICD-10-CM | POA: Diagnosis not present

## 2024-04-05 DIAGNOSIS — M67442 Ganglion, left hand: Secondary | ICD-10-CM | POA: Diagnosis not present

## 2024-04-05 MED ORDER — DICLOFENAC SODIUM 75 MG PO TBEC: 75.0000 mg | DELAYED_RELEASE_TABLET | Freq: Two times a day (BID) | ORAL | 2 refills | Status: DC

## 2024-04-05 NOTE — Progress Notes (Signed)
 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 of worsening pain in his hands with stiffness and swelling interfering with his job duties  He was seen by [facility] started on a Dosepak which did not help at all  Also of his pain is in the MCP joint and on the left long finger he is status post incision and drainage and debridement of DIP joint with excision of mucous cyst which has returned  He says he will pop the area with the pin when it becomes painful and starts draining  I told him not to do that since he is diabetic and he is at risk of losing the finger if it gets infected  Problem list, medical hx, medications and allergies reviewed    Physical Exam Vitals and nursing note reviewed.  Constitutional:      Appearance: Normal appearance.  HENT:     Head: Normocephalic and atraumatic.  Eyes:     General: No scleral icterus.       Right eye: No discharge.        Left eye: No discharge.     Extraocular Movements: Extraocular movements intact.     Conjunctiva/sclera: Conjunctivae normal.     Pupils: Pupils are equal, round, and reactive to light.  Cardiovascular:     Rate and Rhythm: Normal rate.     Pulses: Normal pulses.  Musculoskeletal:     Comments: He does have swelling of both MCP joints mainly the middle ones he cannot fully extend the joints or make a fist he does not have any carpal tunnel symptoms or signs  Skin:    General: Skin is warm and dry.     Capillary Refill: Capillary refill takes less than 2 seconds.  Neurological:     General: No focal deficit present.     Mental Status: He is alert and oriented to person, place, and time.  Psychiatric:        Mood and Affect: Mood normal.        Behavior: Behavior normal.        Thought Content: Thought content normal.        Judgment: Judgment normal.    [facility] at [facility].  3 views of the radiographs both hands show no acute fractures or dislocations mild degenerative changes throughout the hands bilaterally healed fifth metacarpal fracture on the right.  I do not have the films I did not feel it was necessary to repeat them as he is going to follow-up with hand surgery they are in June  All records from [facility] reviewed from March 05, 2024  Encounter Diagnoses  Name Primary?   Arthritis of both hands Yes   Mucous cyst of digit of left hand    He can start diclofenac 75 mg twice a day  Meds ordered this encounter  Medications   diclofenac (VOLTAREN) 75 MG EC tablet    Sig: Take 1 tablet (75 mg total) by mouth 2 (two) times daily with a meal.    Dispense:  60 tablet    Refill:  2

## 2024-04-05 NOTE — Progress Notes (Signed)
  Intake history:  BP (!) 188/114   Pulse 78   Ht 5\' 10"  (1.778 m)   Wt 193 lb (87.5 kg)   BMI 27.69 kg/m  Body mass index is 27.69 kg/m.  Chief Complaint  Patient presents with   Hand Pain    Bilateral      WHAT ARE WE SEEING YOU FOR TODAY?   bilateral hand(s)  How long has this bothered you? (DOI?DOS?WS?)    Anticoag.  No  Diabetes Yes  Heart disease Yes  Hypertension Yes  SMOKING HX Yes  Kidney disease No  Any ALLERGIES _________________No Known Allergies _____________________________   Treatment:  Have you taken:  Tylenol  No  Advil No  Had PT No  Had injection No  Other  _________________________

## 2024-06-23 ENCOUNTER — Ambulatory Visit: Admitting: Family Medicine

## 2024-07-07 ENCOUNTER — Ambulatory Visit: Admitting: Family Medicine

## 2024-07-07 ENCOUNTER — Encounter: Payer: Self-pay | Admitting: Family Medicine

## 2024-07-07 DIAGNOSIS — E118 Type 2 diabetes mellitus with unspecified complications: Secondary | ICD-10-CM | POA: Diagnosis not present

## 2024-07-07 DIAGNOSIS — E1169 Type 2 diabetes mellitus with other specified complication: Secondary | ICD-10-CM | POA: Diagnosis not present

## 2024-07-07 DIAGNOSIS — I2510 Atherosclerotic heart disease of native coronary artery without angina pectoris: Secondary | ICD-10-CM | POA: Diagnosis not present

## 2024-07-07 DIAGNOSIS — N529 Male erectile dysfunction, unspecified: Secondary | ICD-10-CM

## 2024-07-07 DIAGNOSIS — Z130 Encounter for screening for diseases of the blood and blood-forming organs and certain disorders involving the immune mechanism: Secondary | ICD-10-CM

## 2024-07-07 DIAGNOSIS — E785 Hyperlipidemia, unspecified: Secondary | ICD-10-CM

## 2024-07-07 DIAGNOSIS — I10 Essential (primary) hypertension: Secondary | ICD-10-CM

## 2024-07-07 DIAGNOSIS — Z720 Tobacco use: Secondary | ICD-10-CM

## 2024-07-07 DIAGNOSIS — Z125 Encounter for screening for malignant neoplasm of prostate: Secondary | ICD-10-CM

## 2024-07-07 NOTE — Assessment & Plan Note (Signed)
 Vascular nature/organic due to underlying cardiovascular disease, hypertension, hyperlipidemia, type 2 diabetes, and tobacco abuse.  As requested, I am referring him to urology.

## 2024-07-07 NOTE — Patient Instructions (Addendum)
 I do not recommend long term NSAIDs in the setting of cardiovascular disease.  Recommend smoking cessation.  Labs today including PSA.   Referral placed to urology as requested.

## 2024-07-07 NOTE — Assessment & Plan Note (Signed)
 Advised patient that I do not recommend chronic NSAID use in the setting of cardiovascular disease.  Patient states that I have to make a living.  Recommend use of aspirin .

## 2024-07-07 NOTE — Assessment & Plan Note (Signed)
Needs smoking cessation. 

## 2024-07-07 NOTE — Progress Notes (Signed)
 Subjective:  Patient ID: [name], male    DOB: 09-Nov-1971  Age: 53 y.o. MRN: [id number]  CC:   Chief Complaint  Patient presents with   Referral    Would like referral to urology     HPI:  53 year old male with cardiovascular disease, hypertension, type 2 diabetes, hyperlipidemia, tobacco abuse presents for evaluation of the above.  Patient states that he is not taking his medications.  He states that he lost his insurance and then did not reach out to restart medication.  I have not seen him in 2 years.  He is not taking aspirin .  He not taking anything for diabetes.  The only medications he is taking is glucosamine and chondroitin as well as diclofenac  and a multivitamin.  He continues to smoke.  Patient states that he would like to see urology.  He has had a friend recently was diagnosed with cancer.  He is having issues with erectile dysfunction and is requesting to see a urologist.  No recent labs.  Needs labs today.  He is amenable to this.   Patient Active Problem List   Diagnosis Date Noted   Erectile dysfunction 07/07/2024   Hyperlipidemia associated with type 2 diabetes mellitus (HCC) 12/26/2021   CAD (coronary artery disease)    S/P CABG x 4 05/13/2016   Tobacco abuse 05/09/2016   Essential hypertension    Diabetes mellitus with complication ([facility])    [place] node 10/18/2015    Social Hx   Social History   Socioeconomic History   Marital status: Married    Spouse name: Not on file   Number of children: Not on file   Years of education: Not on file   Highest education level: Not on file  Occupational History   Not on file  Tobacco Use   Smoking status: Every Day    Current packs/day: 0.00    Types: Cigarettes    Last attempt to quit: 05/10/2016    Years since quitting: 8.1   Smokeless tobacco: Never  Vaping Use   Vaping status: Never Used  Substance and Sexual Activity   Alcohol use: No    Alcohol/week: 0.0 standard drinks of alcohol   Drug  use: Not Currently    Types: Marijuana    Comment: occasionally marjuana   Sexual activity: Not on file  Other Topics Concern   Not on file  Social History Narrative   Not on file   Social Drivers of Health   Financial Resource Strain: Not on file  Food Insecurity: Not on file  Transportation Needs: Not on file  Physical Activity: Not on file  Stress: Not on file  Social Connections: Not on file    Review of Systems Per HPI  Objective:  BP (!) 163/90   Pulse 67   Temp 97.7 F (36.5 C)   Ht 5' 10 (1.778 m)   Wt 193 lb (87.5 kg)   SpO2 98%   BMI 27.69 kg/m      07/07/2024    8:34 AM 07/07/2024    8:15 AM 04/05/2024    8:54 AM  BP/Weight  Systolic BP 163 174 188  Diastolic BP 90 88 114  Wt. (Lbs)  193 193  BMI  27.69 kg/m2 27.69 kg/m2    Physical Exam Vitals and nursing note reviewed.  Constitutional:      General: He is not in acute distress.    Appearance: Normal appearance.  HENT:     Head: Normocephalic and  atraumatic.  Cardiovascular:     Rate and Rhythm: Normal rate and regular rhythm.  Pulmonary:     Effort: Pulmonary effort is normal.     Breath sounds: Normal breath sounds.  Neurological:     Mental Status: He is alert.     Lab Results  Component Value Date   WBC 9.6 07/15/2022   HGB 16.5 07/15/2022   HCT 48.2 07/15/2022   PLT 245 07/15/2022   GLUCOSE 156 (H) 07/15/2022   CHOL 205 (H) 07/15/2022   TRIG 213 (H) 07/15/2022   HDL 39 (L) 07/15/2022   LDLCALC 128 (H) 07/15/2022   ALT 27 07/15/2022   AST 16 07/15/2022   NA 143 07/15/2022   K 5.4 (H) 07/15/2022   CL 106 07/15/2022   CREATININE 0.99 07/15/2022   BUN 17 07/15/2022   CO2 23 07/15/2022   INR 1.27 05/13/2016   HGBA1C 7.7 (H) 07/15/2022     Assessment & Plan:  Erectile dysfunction, unspecified erectile dysfunction type Assessment & Plan: Vascular nature/organic due to underlying cardiovascular disease, hypertension, hyperlipidemia, type 2 diabetes, and tobacco abuse.  As  requested, I am referring him to urology.  Orders: -     Ambulatory referral to Urology  Hyperlipidemia associated with type 2 diabetes mellitus (HCC) -     Lipid panel  Diabetes mellitus with complication (HCC) Assessment & Plan: Currently uncontrolled and untreated.  Awaiting A1c.  Orders: -     CMP14+EGFR -     Hemoglobin A1c -     Microalbumin / creatinine urine ratio  Screening for deficiency anemia -     CBC  Prostate cancer screening -     PSA  Coronary artery disease involving native coronary artery of native heart without angina pectoris Assessment & Plan: Advised patient that I do not recommend chronic NSAID use in the setting of cardiovascular disease.  Patient states that I have to make a living.  Recommend use of aspirin .   Essential hypertension Assessment & Plan: Uncontrolled secondary to noncompliance.  Labs today.   Tobacco abuse Assessment & Plan: Needs smoking cessation.     Follow-up:  Pending labs  Gracyn Santillanes Bluford DO [facility]

## 2024-07-07 NOTE — Assessment & Plan Note (Signed)
 Uncontrolled secondary to noncompliance.  Labs today.

## 2024-07-07 NOTE — Assessment & Plan Note (Signed)
 Currently uncontrolled and untreated.  Awaiting A1c.

## 2024-07-08 ENCOUNTER — Ambulatory Visit: Payer: Self-pay | Admitting: Family Medicine

## 2024-07-08 LAB — MICROALBUMIN / CREATININE URINE RATIO
Creatinine, Urine: 232.2 mg/dL
Microalb/Creat Ratio: 254 mg/g{creat} — ABNORMAL HIGH (ref 0–29)
Microalbumin, Urine: 589.2 ug/mL

## 2024-07-08 LAB — CMP14+EGFR
ALT: 24 IU/L (ref 0–44)
AST: 20 IU/L (ref 0–40)
Albumin: 4.6 g/dL (ref 3.8–4.9)
Alkaline Phosphatase: 84 IU/L (ref 44–121)
BUN/Creatinine Ratio: 16 (ref 9–20)
BUN: 16 mg/dL (ref 6–24)
Bilirubin Total: 0.4 mg/dL (ref 0.0–1.2)
CO2: 23 mmol/L (ref 20–29)
Calcium: 10.1 mg/dL (ref 8.7–10.2)
Chloride: 102 mmol/L (ref 96–106)
Creatinine, Ser: 1.02 mg/dL (ref 0.76–1.27)
Globulin, Total: 2.2 g/dL (ref 1.5–4.5)
Glucose: 194 mg/dL — ABNORMAL HIGH (ref 70–99)
Potassium: 4.9 mmol/L (ref 3.5–5.2)
Sodium: 140 mmol/L (ref 134–144)
Total Protein: 6.8 g/dL (ref 6.0–8.5)
eGFR: 87 mL/min/1.73 (ref >59)

## 2024-07-08 LAB — CBC
Hematocrit: 49.9 % (ref 37.5–51.0)
Hemoglobin: 16.4 g/dL (ref 13.0–17.7)
MCH: 32.1 pg (ref 26.6–33.0)
MCHC: 32.9 g/dL (ref 31.5–35.7)
MCV: 98 fL — ABNORMAL HIGH (ref 79–97)
Platelets: 191 x10E3/uL (ref 150–450)
RBC: 5.11 x10E6/uL (ref 4.14–5.80)
RDW: 12.3 % (ref 11.6–15.4)
WBC: 7 x10E3/uL (ref 3.4–10.8)

## 2024-07-08 LAB — LIPID PANEL
Chol/HDL Ratio: 8.1 ratio — ABNORMAL HIGH (ref 0.0–5.0)
Cholesterol, Total: 293 mg/dL — ABNORMAL HIGH (ref 100–199)
HDL: 36 mg/dL — ABNORMAL LOW (ref >39)
LDL Chol Calc (NIH): 212 mg/dL — ABNORMAL HIGH (ref 0–99)
Triglycerides: 228 mg/dL — ABNORMAL HIGH (ref 0–149)
VLDL Cholesterol Cal: 45 mg/dL — ABNORMAL HIGH (ref 5–40)

## 2024-07-08 LAB — HEMOGLOBIN A1C
Est. average glucose Bld gHb Est-mCnc: 192 mg/dL
Hgb A1c MFr Bld: 8.3 % — ABNORMAL HIGH (ref 4.8–5.6)

## 2024-07-08 LAB — PSA: Prostate Specific Ag, Serum: 0.9 ng/mL (ref 0.0–4.0)

## 2024-07-09 NOTE — Progress Notes (Signed)
 Called patient, no answer, no voicemail set up. Mychart message left to call and schedule f/u appt.
